# Patient Record
Sex: Female | Born: 1940
Health system: Southern US, Community
[De-identification: ages and names within clinical notes are randomized; demographics above are authoritative.]

## PROBLEM LIST (undated history)

## (undated) DIAGNOSIS — D649 Anemia, unspecified: Secondary | ICD-10-CM

## (undated) DIAGNOSIS — Z87442 Personal history of urinary calculi: Secondary | ICD-10-CM

## (undated) DIAGNOSIS — M1990 Unspecified osteoarthritis, unspecified site: Secondary | ICD-10-CM

## (undated) DIAGNOSIS — K620 Anal polyp: Secondary | ICD-10-CM

## (undated) DIAGNOSIS — K5792 Diverticulitis of intestine, part unspecified, without perforation or abscess without bleeding: Secondary | ICD-10-CM

## (undated) DIAGNOSIS — I10 Essential (primary) hypertension: Secondary | ICD-10-CM

## (undated) DIAGNOSIS — K6389 Other specified diseases of intestine: Secondary | ICD-10-CM

## (undated) DIAGNOSIS — N200 Calculus of kidney: Secondary | ICD-10-CM

## (undated) DIAGNOSIS — N189 Chronic kidney disease, unspecified: Secondary | ICD-10-CM

## (undated) DIAGNOSIS — K5010 Crohn's disease of large intestine without complications: Secondary | ICD-10-CM

## (undated) HISTORY — DX: Anal polyp: K62.0

## (undated) HISTORY — DX: Crohn's disease of large intestine without complications: K50.10

## (undated) HISTORY — DX: Essential (primary) hypertension: I10

## (undated) HISTORY — DX: Calculus of kidney: N20.0

## (undated) HISTORY — PX: KIDNEY STONE SURGERY: SHX686

---

## 1999-02-20 ENCOUNTER — Other Ambulatory Visit: Admission: RE | Admit: 1999-02-20 | Discharge: 1999-02-20 | Payer: Self-pay | Admitting: *Deleted

## 1999-08-13 ENCOUNTER — Other Ambulatory Visit: Admission: RE | Admit: 1999-08-13 | Discharge: 1999-08-13 | Payer: Self-pay | Admitting: *Deleted

## 1999-10-16 ENCOUNTER — Encounter (INDEPENDENT_AMBULATORY_CARE_PROVIDER_SITE_OTHER): Payer: Self-pay

## 1999-10-16 ENCOUNTER — Other Ambulatory Visit: Admission: RE | Admit: 1999-10-16 | Discharge: 1999-10-16 | Payer: Self-pay | Admitting: *Deleted

## 1999-10-30 ENCOUNTER — Encounter: Admission: RE | Admit: 1999-10-30 | Discharge: 1999-10-30 | Payer: Self-pay | Admitting: *Deleted

## 2000-02-03 ENCOUNTER — Other Ambulatory Visit: Admission: RE | Admit: 2000-02-03 | Discharge: 2000-02-03 | Payer: Self-pay | Admitting: *Deleted

## 2000-02-03 ENCOUNTER — Encounter (INDEPENDENT_AMBULATORY_CARE_PROVIDER_SITE_OTHER): Payer: Self-pay

## 2000-04-28 ENCOUNTER — Other Ambulatory Visit: Admission: RE | Admit: 2000-04-28 | Discharge: 2000-04-28 | Payer: Self-pay | Admitting: *Deleted

## 2000-09-08 ENCOUNTER — Other Ambulatory Visit: Admission: RE | Admit: 2000-09-08 | Discharge: 2000-09-08 | Payer: Self-pay | Admitting: *Deleted

## 2000-10-05 ENCOUNTER — Encounter: Admission: RE | Admit: 2000-10-05 | Discharge: 2000-10-05 | Payer: Self-pay | Admitting: Specialist

## 2000-10-05 ENCOUNTER — Encounter: Payer: Self-pay | Admitting: Specialist

## 2000-10-31 ENCOUNTER — Encounter: Admission: RE | Admit: 2000-10-31 | Discharge: 2000-10-31 | Payer: Self-pay | Admitting: *Deleted

## 2000-11-04 ENCOUNTER — Encounter: Admission: RE | Admit: 2000-11-04 | Discharge: 2000-11-04 | Payer: Self-pay | Admitting: *Deleted

## 2000-11-10 ENCOUNTER — Ambulatory Visit (HOSPITAL_COMMUNITY): Admission: RE | Admit: 2000-11-10 | Discharge: 2000-11-10 | Payer: Self-pay | Admitting: Specialist

## 2001-05-15 ENCOUNTER — Other Ambulatory Visit: Admission: RE | Admit: 2001-05-15 | Discharge: 2001-05-15 | Payer: Self-pay | Admitting: *Deleted

## 2001-10-03 ENCOUNTER — Other Ambulatory Visit: Admission: RE | Admit: 2001-10-03 | Discharge: 2001-10-03 | Payer: Self-pay | Admitting: *Deleted

## 2001-11-06 ENCOUNTER — Encounter: Admission: RE | Admit: 2001-11-06 | Discharge: 2001-11-06 | Payer: Self-pay | Admitting: *Deleted

## 2002-10-10 ENCOUNTER — Other Ambulatory Visit: Admission: RE | Admit: 2002-10-10 | Discharge: 2002-10-10 | Payer: Self-pay | Admitting: *Deleted

## 2003-04-08 ENCOUNTER — Ambulatory Visit (HOSPITAL_COMMUNITY): Admission: RE | Admit: 2003-04-08 | Discharge: 2003-04-08 | Payer: Self-pay | Admitting: Gastroenterology

## 2003-04-08 ENCOUNTER — Encounter (INDEPENDENT_AMBULATORY_CARE_PROVIDER_SITE_OTHER): Payer: Self-pay | Admitting: *Deleted

## 2003-04-08 HISTORY — PX: COLONOSCOPY: SHX174

## 2003-10-12 DIAGNOSIS — K620 Anal polyp: Secondary | ICD-10-CM

## 2003-10-12 HISTORY — DX: Anal polyp: K62.0

## 2003-10-31 ENCOUNTER — Other Ambulatory Visit: Admission: RE | Admit: 2003-10-31 | Discharge: 2003-10-31 | Payer: Self-pay | Admitting: Dermatology

## 2003-11-06 ENCOUNTER — Encounter (INDEPENDENT_AMBULATORY_CARE_PROVIDER_SITE_OTHER): Payer: Self-pay | Admitting: *Deleted

## 2003-11-06 ENCOUNTER — Ambulatory Visit (HOSPITAL_COMMUNITY): Admission: RE | Admit: 2003-11-06 | Discharge: 2003-11-06 | Payer: Self-pay | Admitting: Gastroenterology

## 2003-11-06 HISTORY — PX: FLEXIBLE SIGMOIDOSCOPY: SHX1649

## 2003-11-15 ENCOUNTER — Other Ambulatory Visit: Admission: RE | Admit: 2003-11-15 | Discharge: 2003-11-15 | Payer: Self-pay | Admitting: *Deleted

## 2003-12-31 ENCOUNTER — Encounter: Admission: RE | Admit: 2003-12-31 | Discharge: 2003-12-31 | Payer: Self-pay | Admitting: General Surgery

## 2004-01-02 ENCOUNTER — Ambulatory Visit (HOSPITAL_COMMUNITY): Admission: RE | Admit: 2004-01-02 | Discharge: 2004-01-02 | Payer: Self-pay | Admitting: General Surgery

## 2004-01-02 ENCOUNTER — Ambulatory Visit (HOSPITAL_BASED_OUTPATIENT_CLINIC_OR_DEPARTMENT_OTHER): Admission: RE | Admit: 2004-01-02 | Discharge: 2004-01-02 | Payer: Self-pay | Admitting: General Surgery

## 2004-01-02 ENCOUNTER — Encounter (INDEPENDENT_AMBULATORY_CARE_PROVIDER_SITE_OTHER): Payer: Self-pay | Admitting: Specialist

## 2004-01-02 HISTORY — PX: OTHER SURGICAL HISTORY: SHX169

## 2008-02-06 ENCOUNTER — Ambulatory Visit (HOSPITAL_COMMUNITY): Admission: RE | Admit: 2008-02-06 | Discharge: 2008-02-06 | Payer: Self-pay | Admitting: Urology

## 2010-06-09 ENCOUNTER — Encounter (HOSPITAL_COMMUNITY): Admission: RE | Admit: 2010-06-09 | Discharge: 2010-07-09 | Payer: Self-pay | Admitting: Orthopedic Surgery

## 2011-02-08 ENCOUNTER — Other Ambulatory Visit: Payer: Self-pay | Admitting: Obstetrics & Gynecology

## 2011-02-26 NOTE — Op Note (Signed)
Christine Newman, Christine Newman                      ACCOUNT NO.:  [id number]   MEDICAL RECORD NO.:  [id number]                   PATIENT TYPE:  AMB   LOCATION:  DSC                                  FACILITY:  [facility]   PHYSICIAN:  Odis Hollingshead, M.D.            DATE OF BIRTH:  April 21, 1941   DATE OF PROCEDURE:  01/02/2004  DATE OF DISCHARGE:                                 OPERATIVE REPORT   PREOPERATIVE DIAGNOSIS:  Anal polyp.   POSTOPERATIVE DIAGNOSIS:  Anal polyp.   PROCEDURES:  1. Anoscopy.  2. Anal polypectomy (right lateral position at 9 o'clock).   SURGEON:  Odis Hollingshead, M.D.   ANESTHESIA:  General.   INDICATIONS FOR PROCEDURE:  The patient is a 70 year old female who  underwent a screening colonoscopy.  There was a nodular mass noted at the  anal verge and this was biopsied and demonstrated some benign rectal mucosa.  There was, however, a polyp at the dentate line that could not be removed  and thus she presents for that.  The procedure and the risks were discussed  with her.   DESCRIPTION OF PROCEDURE:  She was seen in the holding area and then brought  to the operating room, placed supine on the operating table and general  anesthetic was administered.  She was then placed in the lithotomy position.  The perianal area was sterilely prepped and draped.  Digital rectal  examination was performed and a polypoid mass could be palpated in the right  lateral position at approximately 9 o'clock. Anoscopy was performed and no  other masses were noted.  The polypoid mass was grasped and using the  electrocautery it was excised and then sent to pathology.  Bleeding was  controlled with the cautery.  This was at the dentate line.  I then  performed a perianal block with a mixture of Marcaine and lidocaine.  I  reinspected the anal area and again saw no other lesions.  I then removed  the anoscope.  A dry dressing was applied.   She tolerated the procedure well without any  apparent complications and was  taken to the recovery room in satisfactory condition.  She was given  postoperative instructions and will come back and see me in a couple of  weeks for follow-up.  I told her we would contact me with her pathology  report.                                               Odis Hollingshead, M.D.    Katina Degree  D:  01/02/2004  T:  01/02/2004  Job:  [id number]   cc:   Nelwyn Salisbury, M.D.  [place], [address]  [place] [address]  Fax: [phone]

## 2011-02-26 NOTE — Op Note (Signed)
Christine Newman, BOTTENFIELD                      ACCOUNT NO.:  [id number]   MEDICAL RECORD NO.:  [id number]                   PATIENT TYPE:  AMB   LOCATION:  ENDO                                 FACILITY:  [facility]   PHYSICIAN:  Nelwyn Salisbury, M.D.               DATE OF BIRTH:  1941-04-27   DATE OF PROCEDURE:  04/08/2003  DATE OF DISCHARGE:                                 OPERATIVE REPORT   PROCEDURE PERFORMED:  Colonoscopy with biopsies.   ENDOSCOPIST:  Juanita Craver, M.D.   INSTRUMENT USED:  Olympus video colonoscope.   INDICATIONS FOR PROCEDURE:  The patient is a 70 year old white female  undergoing screening colonoscopy.  Rectal examination with some nodularity  at the anal verge.   PREPROCEDURE PREPARATION:  Informed consent was procured from the patient.  The patient was fasted for eight hours prior to the procedure and prepped  with a bottle of magnesium citrate and a gallon of GoLYTELY the night prior  to the procedure.   PREPROCEDURE PHYSICAL:  The patient had stable vital signs.  Neck supple.  Chest clear to auscultation.  S1 and S2 regular.  Abdomen soft with normal  bowel sounds.   DESCRIPTION OF PROCEDURE:  The patient was placed in left lateral decubitus  position and sedated with 75 mg of Demerol and 7.5 mg of Versed  intravenously.  Once the patient was adequately sedated and maintained on  low flow oxygen and continuous cardiac monitoring, the Olympus video  colonoscope was advanced from the rectum to the cecum.  There was some  residual stool in the colon and multiple washes were done.  Mild  inflammatory changes were noted at the lip of the ileocecal valve but the  terminal ileum was not visualized.  The appendicular orifice and ileocecal  valve were clearly visualized and photographed.  The rest of the colonic  mucosa appeared healthy including the right colon, transverse colon, left  colon.  A nodular polypoid lesion was seen at the anal verge.  This was  biopsied for pathology.  Small internal and external hemorrhoids were seen  on retroflexion and anal inspection respectively.  The patient tolerated the  procedure well without complications.   IMPRESSION:  1. Small nonbleeding internal and external hemorrhoids.  2. Small polypoid mass at anal verge, biopsy done for pathology, results     pending.  3. Normal-appearing left colon, transverse colon, right colon, including     cecum.  4. Stenotic ileocecal valve.  Terminal ileum not visualized.   RECOMMENDATIONS:  1. Small bowel follow-through will be done and further recommendations will     be made as needed.  2. Await pathology results.  3. Outpatient follow-up in the next two weeks or earlier if need be.  Nelwyn Salisbury, M.D.    JNM/MEDQ  D:  04/08/2003  T:  04/09/2003  Job:  [id number]   cc:   Freddie Apley, M.D.  [address]  [place] [address]  Fax: [phone]

## 2011-02-26 NOTE — Op Note (Signed)
NAME:  MARTIA, Christine Newman                      ACCOUNT NO.:  [id number]   MEDICAL RECORD NO.:  [id number]                   PATIENT TYPE:  AMB   LOCATION:  ENDO                                 FACILITY:  [facility]   PHYSICIAN:  Nelwyn Salisbury, M.D.               DATE OF BIRTH:  1940-10-28   DATE OF PROCEDURE:  11/06/2003  DATE OF DISCHARGE:                                 OPERATIVE REPORT   PROCEDURE PERFORMED:  Flexible sigmoidoscopy with biopsies at the anal  verge.   ENDOSCOPIST:  Juanita Craver, M.D.   INSTRUMENT USED:  Olympus video colonoscope.   INDICATIONS FOR PROCEDURE:  The patient is a 70 year old white female with a  history of mass at the anal verge biopsied six month ago, revealed to be a  benign lesion consistent with mucosal prolapse syndrome.  Repeat flexible  sigmoidoscopy is being done to re-look at this area and re-biopsy any  recurrent lesion.   PREPROCEDURE PREPARATION:  Informed consent was procured from the patient.  The patient was fasted for eight hours prior to the procedure and prepped  with a bottle or MiraLax and two Fleet enemas the night prior to the  procedure.   PREPROCEDURE PHYSICAL:  The patient had stable vital signs.  Neck supple.  Chest clear to auscultation.  S1 and S2 regular.  Abdomen soft with normal  bowel sounds.   DESCRIPTION OF PROCEDURE:  The patient was placed in left lateral decubitus  position.  The patient chose not to have any sedation.  Once the patient was  adequately positioned, the Olympus video colonoscope was advanced from the  rectum to 30 cm.  A few early scattered diverticula were seen  in the left  colon.  A small nodular mass was biopsied at the anal verge.  This was  irregular in nature, was more easily visualized on retroflexion.  Small  internal hemorrhoids were seen as well.  The patient tolerated the procedure  well without complications.   IMPRESSION:  1. Small nodular mass at anal verge, biopsied for  pathology.  2. Early left-sided diverticula.   RECOMMENDATIONS:  1. Await pathology results.  2. Surgical evaluation by Odis Hollingshead, M.D. for transanal excision of     this lesion.  3. Outpatient followup thereafter.                                               Nelwyn Salisbury, M.D.    JNM/MEDQ  D:  11/06/2003  T:  11/06/2003  Job:  [id number]   cc:   Freddie Apley, M.D.  [place]  [address] [place] [address]  Fax: [phone]   Odis Hollingshead, M.D.  [address], [address] [place]  [place] [address]  Fax: [phone]

## 2013-03-08 ENCOUNTER — Ambulatory Visit (INDEPENDENT_AMBULATORY_CARE_PROVIDER_SITE_OTHER): Payer: 59 | Admitting: Family Medicine

## 2013-03-08 ENCOUNTER — Encounter: Payer: Self-pay | Admitting: Family Medicine

## 2013-03-08 VITALS — BP 122/78 | HR 80 | Wt 169.6 lb

## 2013-03-08 DIAGNOSIS — N289 Disorder of kidney and ureter, unspecified: Secondary | ICD-10-CM

## 2013-03-08 DIAGNOSIS — I10 Essential (primary) hypertension: Secondary | ICD-10-CM

## 2013-03-08 NOTE — Progress Notes (Signed)
  Subjective:    Patient ID: Karlton Lemon, female    DOB: 23-Apr-1941, 72 y.o.   MRN: [id number]  Hypertension This is a chronic problem. The current episode started more than 1 year ago. The problem is unchanged. The problem is controlled. Pertinent negatives include no anxiety. There are no associated agents to hypertension. Past treatments include ACE inhibitors and diuretics. The current treatment provides significant improvement. There are no compliance problems.    Followed by specialist for her chronic renal insufficiency Checks bp on occasion.  Review of Systems No chest pain no headache no abdominal pain review systems otherwise negative.    Objective:   Physical Exam  Alert no acute distress. Vitals stable. Lungs clear. Heart regular rate and rhythm. HEENT normal.  Impression blood work reviewed    Assessment & Plan:  Impression #1 hypertension good control. #2 renal insufficiency clinically stable. Plan diet exercise discussed. Maintain same medications. Recheck in 6 months. WSL

## 2013-03-11 DIAGNOSIS — N289 Disorder of kidney and ureter, unspecified: Secondary | ICD-10-CM | POA: Insufficient documentation

## 2013-03-11 DIAGNOSIS — I10 Essential (primary) hypertension: Secondary | ICD-10-CM | POA: Insufficient documentation

## 2013-05-07 ENCOUNTER — Other Ambulatory Visit: Payer: Self-pay | Admitting: Family Medicine

## 2013-06-06 ENCOUNTER — Telehealth: Payer: Self-pay | Admitting: Family Medicine

## 2013-06-06 MED ORDER — ENALAPRIL MALEATE 20 MG PO TABS: ORAL_TABLET | ORAL | Status: DC

## 2013-06-06 NOTE — Telephone Encounter (Signed)
Patient states she needs a prescription for enalapril (VASOTEC) 20 MG tablet  [facility].  Only has one pill left to take.

## 2013-06-06 NOTE — Telephone Encounter (Signed)
RX refill for enalapril 20 mg sent to pharmacy. Patient was notified.

## 2013-08-27 ENCOUNTER — Encounter: Payer: Self-pay | Admitting: Family Medicine

## 2013-08-27 ENCOUNTER — Ambulatory Visit (INDEPENDENT_AMBULATORY_CARE_PROVIDER_SITE_OTHER): Payer: 59 | Admitting: Family Medicine

## 2013-08-27 VITALS — BP 132/82 | Ht 66.0 in | Wt 175.5 lb

## 2013-08-27 DIAGNOSIS — H8110 Benign paroxysmal vertigo, unspecified ear: Secondary | ICD-10-CM | POA: Insufficient documentation

## 2013-08-27 MED ORDER — ONDANSETRON 4 MG PO TBDP: 4.0000 mg | ORAL_TABLET | Freq: Three times a day (TID) | ORAL | Status: DC | PRN

## 2013-08-27 MED ORDER — MECLIZINE HCL 25 MG PO TABS: 25.0000 mg | ORAL_TABLET | Freq: Three times a day (TID) | ORAL | Status: AC | PRN

## 2013-08-27 MED ORDER — AMOXICILLIN 500 MG PO TABS: ORAL_TABLET | ORAL | Status: DC

## 2013-08-27 NOTE — Progress Notes (Signed)
  Subjective:    Patient ID: Christine Newman, female    DOB: [birth year], 72 y.o.   MRN: [id number]  HPI Patient arrives with complaint of dizzy spells-room spins off and on for a week.for over a week has had symptoms  Room circling around and around. Woke up with it.  Felt dizzy when walking. Nauseated off and on. No vom  Dizzy with change of position.  Not true headache.  Feels funny, at times. Patient also concerned because family history of brain cancer  No sig drainage or congestion, but some stopped upness in nose Put up with it for every dday until now No otc meds Review of Systems  soe nausea at times no vomiting no chest pain no abdominal pain no change in bowel habits ROS otherwise negative    Objective:   Physical Exam Alert anxious appearing hydration good. HEENT completely normal. Neuro exam intact. Cerebellar function. Normal. Lungs clear. Heart regular in rhythm.       Assessment & Plan:  Impression vertigo discussed at length. #2 possible sinusitis discussed plan Antivert 25 every 6 when necessary for dizziness. A mocks 500 3 times a day 10 days. Zofran when necessary for nausea. Exercise encourage. Check in 2 weeks. Easily 25 minutes spent most in discussion. WSL

## 2013-09-08 ENCOUNTER — Encounter: Payer: Self-pay | Admitting: *Deleted

## 2013-09-10 ENCOUNTER — Ambulatory Visit (INDEPENDENT_AMBULATORY_CARE_PROVIDER_SITE_OTHER): Payer: 59 | Admitting: Family Medicine

## 2013-09-10 ENCOUNTER — Encounter: Payer: Self-pay | Admitting: Family Medicine

## 2013-09-10 VITALS — BP 150/90 | Ht 66.0 in | Wt 174.6 lb

## 2013-09-10 DIAGNOSIS — H8110 Benign paroxysmal vertigo, unspecified ear: Secondary | ICD-10-CM

## 2013-09-10 DIAGNOSIS — I10 Essential (primary) hypertension: Secondary | ICD-10-CM

## 2013-09-10 NOTE — Progress Notes (Signed)
   Subjective:    Patient ID: Christine Newman, female    DOB: October 29, 1940, 72 y.o.   MRN: [id number]  HPI Patient is here today for a 2 week f/u on her vertigo.  She states she feels a lot better. She does experience some dizziness in the morning, but it was nothing compared to what it used to be.  Still having spells but much improved  No vertigo symp now,  Nausea is doing better, mo had some diziness  No other concerns. Pt gets seasick but not carsick. Compliant with blood pressure medicine and all numbers in good control Review of Systems No headache no chest pain no abdominal pain no change in bowel habits no nausea no diaphoresis ROS otherwise negative.    Objective:   Physical Exam Alert HEENT normal. Lungs clear. Heart regular rate and rhythm. Blood pressure 134/82 on repeat.       Assessment & Plan:  Impression 1 hypertension good control. #2 vertigo clinically much improved. Plan diet exercise discussed. Maintain same medications. Send copy of blood work are away from specialist. Check every 6 months. WSL

## 2013-10-15 ENCOUNTER — Other Ambulatory Visit: Payer: Self-pay | Admitting: Family Medicine

## 2013-12-03 ENCOUNTER — Other Ambulatory Visit: Payer: Self-pay | Admitting: Family Medicine

## 2014-04-04 ENCOUNTER — Other Ambulatory Visit: Payer: Self-pay | Admitting: Family Medicine

## 2014-05-31 ENCOUNTER — Other Ambulatory Visit: Payer: Self-pay | Admitting: Family Medicine

## 2014-06-06 ENCOUNTER — Ambulatory Visit (INDEPENDENT_AMBULATORY_CARE_PROVIDER_SITE_OTHER): Payer: 59 | Admitting: Otolaryngology

## 2014-06-06 DIAGNOSIS — H6120 Impacted cerumen, unspecified ear: Secondary | ICD-10-CM

## 2014-06-06 DIAGNOSIS — H903 Sensorineural hearing loss, bilateral: Secondary | ICD-10-CM

## 2014-07-31 ENCOUNTER — Other Ambulatory Visit: Payer: Self-pay | Admitting: Family Medicine

## 2014-09-02 ENCOUNTER — Other Ambulatory Visit: Payer: Self-pay | Admitting: Family Medicine

## 2014-10-09 ENCOUNTER — Encounter: Payer: Self-pay | Admitting: Family Medicine

## 2014-10-09 ENCOUNTER — Ambulatory Visit (INDEPENDENT_AMBULATORY_CARE_PROVIDER_SITE_OTHER): Payer: 59 | Admitting: Family Medicine

## 2014-10-09 VITALS — Ht 66.0 in

## 2014-10-09 DIAGNOSIS — I10 Essential (primary) hypertension: Secondary | ICD-10-CM

## 2014-10-09 MED ORDER — VERAPAMIL HCL ER 180 MG PO TBCR: 180.0000 mg | EXTENDED_RELEASE_TABLET | Freq: Every day | ORAL | Status: DC

## 2014-10-09 MED ORDER — HYDROCHLOROTHIAZIDE 25 MG PO TABS: ORAL_TABLET | ORAL | Status: DC

## 2014-10-09 NOTE — Progress Notes (Signed)
   Subjective:    Patient ID: Christine Newman, female    DOB: 05-03-1941, 73 y.o.   MRN: [id number]  Hypertension This is a chronic problem. The current episode started more than 1 year ago. Risk factors for coronary artery disease include post-menopausal state. Treatments tried:  HCTZ.   Pt has felt fine, Saw the kidney doc the 7th of sept Kidney doctor stopped her Vasotec on Dec 7th and did not replace it.  bp has been up lately, pt's cuff is on the small side, called the nephrologist who rec staying with the same med.     Review of Systems Mild to diffuse headache. No chest pain no fever no cough    Objective:   Physical Exam  Alert anxious. 142/92 on repeat HEENT otherwise normal. Lungs clear. Heart regular in rhythm.      Assessment & Plan:  34 impression hypertension with renal insufficiency. Enalapril was stopped hydrochlorothiazide was increased to one full tablet. Continues to note elevated blood pressures been had verapamil 180 SR daily. Follow-up in one month. WSL

## 2014-11-06 ENCOUNTER — Ambulatory Visit (INDEPENDENT_AMBULATORY_CARE_PROVIDER_SITE_OTHER): Payer: Medicare Other | Admitting: Family Medicine

## 2014-11-06 ENCOUNTER — Encounter: Payer: Self-pay | Admitting: Family Medicine

## 2014-11-06 VITALS — BP 128/78 | Ht 66.0 in | Wt 165.0 lb

## 2014-11-06 DIAGNOSIS — I10 Essential (primary) hypertension: Secondary | ICD-10-CM

## 2014-11-06 NOTE — Progress Notes (Signed)
   Subjective:    Patient ID: Christine Newman, female    DOB: 1941/09/24, 74 y.o.   MRN: [id number]  HPI Patient is here today for a recheck on her BP.  She was started back on HCTZ and started verapamil.  BP is much better today. There is also a 10 lb difference.   She has noticed that she has been voiding a lot more and more often d/t the fluid pill.   Pt feels better, numbers running farily good, exercising regularly  Sticking with meds  verap hs and fluid pill in the morn,   Review of Systems No headache no chest pain no shortness of breath no abdominal pain no change in bowel habits alert anxious appearing. Vitals stable. Blood pressure excellent on repeat 134/82. Lungs clear. Heart regular in rhythm. Ankles without edema    Objective:   Physical Exam See above       Assessment & Plan:  Impression hypertension control improved plan maintain same medication. Diet discussed exercise discussed. Recheck in 6 months. WSL

## 2015-01-13 ENCOUNTER — Inpatient Hospital Stay (HOSPITAL_COMMUNITY)
Admission: EM | Admit: 2015-01-13 | Discharge: 2015-01-17 | DRG: 392 | Disposition: A | Discharge status: Home / Self Care | Payer: Medicare Other | Attending: Internal Medicine | Admitting: Internal Medicine

## 2015-01-13 ENCOUNTER — Ambulatory Visit (INDEPENDENT_AMBULATORY_CARE_PROVIDER_SITE_OTHER): Payer: Medicare Other | Admitting: Family Medicine

## 2015-01-13 ENCOUNTER — Emergency Department (HOSPITAL_COMMUNITY): Payer: Medicare Other

## 2015-01-13 ENCOUNTER — Encounter (HOSPITAL_COMMUNITY): Payer: Self-pay | Admitting: Emergency Medicine

## 2015-01-13 ENCOUNTER — Encounter: Payer: Self-pay | Admitting: Family Medicine

## 2015-01-13 VITALS — Temp 101.8°F | Ht 66.0 in | Wt 159.2 lb

## 2015-01-13 DIAGNOSIS — R509 Fever, unspecified: Secondary | ICD-10-CM

## 2015-01-13 DIAGNOSIS — D649 Anemia, unspecified: Secondary | ICD-10-CM | POA: Diagnosis present

## 2015-01-13 DIAGNOSIS — K5720 Diverticulitis of large intestine with perforation and abscess without bleeding: Secondary | ICD-10-CM | POA: Diagnosis not present

## 2015-01-13 DIAGNOSIS — E860 Dehydration: Secondary | ICD-10-CM | POA: Diagnosis present

## 2015-01-13 DIAGNOSIS — N390 Urinary tract infection, site not specified: Secondary | ICD-10-CM | POA: Diagnosis present

## 2015-01-13 DIAGNOSIS — IMO0002 Reserved for concepts with insufficient information to code with codable children: Secondary | ICD-10-CM

## 2015-01-13 DIAGNOSIS — Z8249 Family history of ischemic heart disease and other diseases of the circulatory system: Secondary | ICD-10-CM | POA: Diagnosis not present

## 2015-01-13 DIAGNOSIS — I10 Essential (primary) hypertension: Secondary | ICD-10-CM | POA: Diagnosis present

## 2015-01-13 DIAGNOSIS — N183 Chronic kidney disease, stage 3 unspecified: Secondary | ICD-10-CM | POA: Diagnosis present

## 2015-01-13 DIAGNOSIS — E876 Hypokalemia: Secondary | ICD-10-CM | POA: Diagnosis present

## 2015-01-13 DIAGNOSIS — K6389 Other specified diseases of intestine: Secondary | ICD-10-CM | POA: Diagnosis not present

## 2015-01-13 DIAGNOSIS — K5780 Diverticulitis of intestine, part unspecified, with perforation and abscess without bleeding: Secondary | ICD-10-CM | POA: Diagnosis present

## 2015-01-13 DIAGNOSIS — I129 Hypertensive chronic kidney disease with stage 1 through stage 4 chronic kidney disease, or unspecified chronic kidney disease: Secondary | ICD-10-CM | POA: Diagnosis present

## 2015-01-13 DIAGNOSIS — R300 Dysuria: Secondary | ICD-10-CM

## 2015-01-13 DIAGNOSIS — K5700 Diverticulitis of small intestine with perforation and abscess without bleeding: Secondary | ICD-10-CM | POA: Diagnosis not present

## 2015-01-13 DIAGNOSIS — Z803 Family history of malignant neoplasm of breast: Secondary | ICD-10-CM

## 2015-01-13 DIAGNOSIS — N179 Acute kidney failure, unspecified: Secondary | ICD-10-CM | POA: Diagnosis present

## 2015-01-13 DIAGNOSIS — R1030 Lower abdominal pain, unspecified: Secondary | ICD-10-CM | POA: Diagnosis present

## 2015-01-13 DIAGNOSIS — K5740 Diverticulitis of both small and large intestine with perforation and abscess without bleeding: Secondary | ICD-10-CM | POA: Diagnosis present

## 2015-01-13 DIAGNOSIS — L0291 Cutaneous abscess, unspecified: Secondary | ICD-10-CM

## 2015-01-13 HISTORY — DX: Chronic kidney disease, unspecified: N18.9

## 2015-01-13 HISTORY — DX: Diverticulitis of intestine, part unspecified, without perforation or abscess without bleeding: K57.92

## 2015-01-13 HISTORY — DX: Other specified diseases of intestine: K63.89

## 2015-01-13 LAB — CBC WITH DIFFERENTIAL/PLATELET
Basophils Absolute: 0 10*3/uL (ref 0.0–0.1)
Basophils Relative: 0 % (ref 0–1)
EOS PCT: 0 % (ref 0–5)
Eosinophils Absolute: 0.1 10*3/uL (ref 0.0–0.7)
HCT: 37 % (ref 36.0–46.0)
Hemoglobin: 11.9 g/dL — ABNORMAL LOW (ref 12.0–15.0)
LYMPHS ABS: 1.2 10*3/uL (ref 0.7–4.0)
Lymphocytes Relative: 8 % — ABNORMAL LOW (ref 12–46)
MCH: 29.1 pg (ref 26.0–34.0)
MCHC: 32.2 g/dL (ref 30.0–36.0)
MCV: 90.5 fL (ref 78.0–100.0)
Monocytes Absolute: 1.1 10*3/uL — ABNORMAL HIGH (ref 0.1–1.0)
Monocytes Relative: 7 % (ref 3–12)
Neutro Abs: 12.5 10*3/uL — ABNORMAL HIGH (ref 1.7–7.7)
Neutrophils Relative %: 85 % — ABNORMAL HIGH (ref 43–77)
Platelets: 338 10*3/uL (ref 150–400)
RBC: 4.09 MIL/uL (ref 3.87–5.11)
RDW: 12.6 % (ref 11.5–15.5)
WBC: 14.9 10*3/uL — ABNORMAL HIGH (ref 4.0–10.5)

## 2015-01-13 LAB — URINALYSIS, ROUTINE W REFLEX MICROSCOPIC
BILIRUBIN URINE: NEGATIVE
Glucose, UA: NEGATIVE mg/dL
Nitrite: POSITIVE — AB
PH: 5.5 (ref 5.0–8.0)
Urobilinogen, UA: 0.2 mg/dL (ref 0.0–1.0)

## 2015-01-13 LAB — COMPREHENSIVE METABOLIC PANEL
ALK PHOS: 70 U/L (ref 39–117)
ALT: 10 U/L (ref 0–35)
ANION GAP: 11 (ref 5–15)
AST: 16 U/L (ref 0–37)
Albumin: 3 g/dL — ABNORMAL LOW (ref 3.5–5.2)
BUN: 20 mg/dL (ref 6–23)
CALCIUM: 8.9 mg/dL (ref 8.4–10.5)
CHLORIDE: 98 mmol/L (ref 96–112)
CO2: 27 mmol/L (ref 19–32)
Creatinine, Ser: 1.91 mg/dL — ABNORMAL HIGH (ref 0.50–1.10)
GFR calc Af Amer: 29 mL/min — ABNORMAL LOW (ref >90)
GFR calc non Af Amer: 25 mL/min — ABNORMAL LOW (ref >90)
Glucose, Bld: 113 mg/dL — ABNORMAL HIGH (ref 70–99)
Potassium: 3.1 mmol/L — ABNORMAL LOW (ref 3.5–5.1)
Sodium: 136 mmol/L (ref 135–145)
TOTAL PROTEIN: 6.7 g/dL (ref 6.0–8.3)
Total Bilirubin: 0.6 mg/dL (ref 0.3–1.2)

## 2015-01-13 LAB — POCT URINALYSIS DIPSTICK
PH UA: 6
SPEC GRAV UA: 1.015

## 2015-01-13 LAB — URINE MICROSCOPIC-ADD ON

## 2015-01-13 LAB — PROTIME-INR
INR: 1.14 (ref 0.00–1.49)
Prothrombin Time: 14.8 seconds (ref 11.6–15.2)

## 2015-01-13 LAB — I-STAT CG4 LACTIC ACID, ED: LACTIC ACID, VENOUS: 1.03 mmol/L (ref 0.5–2.0)

## 2015-01-13 LAB — LIPASE, BLOOD: LIPASE: 27 U/L (ref 11–59)

## 2015-01-13 MED ORDER — DEXTROSE 5 % IV SOLN
1.0000 g | Freq: Once | INTRAVENOUS | Status: AC
  Administered 2015-01-13: 1 g via INTRAVENOUS
  Filled 2015-01-13: qty 10

## 2015-01-13 MED ORDER — METRONIDAZOLE IN NACL 5-0.79 MG/ML-% IV SOLN
500.0000 mg | Freq: Once | INTRAVENOUS | Status: AC
  Administered 2015-01-13: 500 mg via INTRAVENOUS
  Filled 2015-01-13: qty 100

## 2015-01-13 MED ORDER — CIPROFLOXACIN IN D5W 400 MG/200ML IV SOLN
400.0000 mg | Freq: Two times a day (BID) | INTRAVENOUS | Status: DC
  Administered 2015-01-13 – 2015-01-14 (×2): 400 mg via INTRAVENOUS
  Filled 2015-01-13 (×2): qty 200

## 2015-01-13 MED ORDER — SODIUM CHLORIDE 0.9 % IV SOLN: INTRAVENOUS | Status: DC

## 2015-01-13 MED ORDER — ACETAMINOPHEN 325 MG PO TABS
650.0000 mg | ORAL_TABLET | Freq: Four times a day (QID) | ORAL | Status: DC | PRN
  Administered 2015-01-15 – 2015-01-16 (×2): 650 mg via ORAL
  Filled 2015-01-13 (×2): qty 2

## 2015-01-13 MED ORDER — POTASSIUM CHLORIDE 10 MEQ/100ML IV SOLN
10.0000 meq | INTRAVENOUS | Status: AC
  Administered 2015-01-13 – 2015-01-14 (×5): 10 meq via INTRAVENOUS
  Filled 2015-01-13 (×2): qty 100

## 2015-01-13 MED ORDER — VERAPAMIL HCL ER 180 MG PO TBCR
180.0000 mg | EXTENDED_RELEASE_TABLET | Freq: Every day | ORAL | Status: DC
  Administered 2015-01-13 – 2015-01-16 (×4): 180 mg via ORAL
  Filled 2015-01-13 (×4): qty 1

## 2015-01-13 MED ORDER — ONDANSETRON HCL 4 MG/2ML IJ SOLN
4.0000 mg | Freq: Once | INTRAMUSCULAR | Status: DC
  Filled 2015-01-13: qty 2

## 2015-01-13 MED ORDER — SODIUM CHLORIDE 0.9 % IV SOLN
INTRAVENOUS | Status: DC
  Administered 2015-01-13: 23:00:00 via INTRAVENOUS

## 2015-01-13 MED ORDER — HYDROCHLOROTHIAZIDE 25 MG PO TABS
25.0000 mg | ORAL_TABLET | Freq: Every day | ORAL | Status: DC
  Administered 2015-01-14 – 2015-01-17 (×4): 25 mg via ORAL
  Filled 2015-01-13 (×4): qty 1

## 2015-01-13 MED ORDER — HEPARIN SODIUM (PORCINE) 5000 UNIT/ML IJ SOLN
5000.0000 [IU] | Freq: Three times a day (TID) | INTRAMUSCULAR | Status: DC
  Administered 2015-01-13 – 2015-01-14 (×2): 5000 [IU] via SUBCUTANEOUS
  Filled 2015-01-13 (×2): qty 1

## 2015-01-13 MED ORDER — POLYETHYLENE GLYCOL 3350 17 G PO PACK: 17.0000 g | PACK | Freq: Every day | ORAL | Status: DC | PRN

## 2015-01-13 MED ORDER — PHENAZOPYRIDINE HCL 100 MG PO TABS
200.0000 mg | ORAL_TABLET | Freq: Three times a day (TID) | ORAL | Status: DC
  Administered 2015-01-13 – 2015-01-15 (×5): 200 mg via ORAL
  Filled 2015-01-13 (×5): qty 2

## 2015-01-13 MED ORDER — ONDANSETRON HCL 4 MG/2ML IJ SOLN
4.0000 mg | Freq: Four times a day (QID) | INTRAMUSCULAR | Status: DC | PRN
  Administered 2015-01-15: 4 mg via INTRAVENOUS
  Filled 2015-01-13: qty 2

## 2015-01-13 MED ORDER — ONDANSETRON HCL 4 MG/2ML IJ SOLN: 4.0000 mg | Freq: Three times a day (TID) | INTRAMUSCULAR | Status: DC | PRN

## 2015-01-13 MED ORDER — ONDANSETRON HCL 4 MG PO TABS: 4.0000 mg | ORAL_TABLET | Freq: Four times a day (QID) | ORAL | Status: DC | PRN

## 2015-01-13 MED ORDER — METRONIDAZOLE IN NACL 5-0.79 MG/ML-% IV SOLN
500.0000 mg | Freq: Three times a day (TID) | INTRAVENOUS | Status: DC
  Administered 2015-01-14 – 2015-01-17 (×10): 500 mg via INTRAVENOUS
  Filled 2015-01-13 (×10): qty 100

## 2015-01-13 MED ORDER — MORPHINE SULFATE 4 MG/ML IJ SOLN: 4.0000 mg | INTRAMUSCULAR | Status: DC | PRN

## 2015-01-13 MED ORDER — HYDROMORPHONE HCL 1 MG/ML IJ SOLN: 0.5000 mg | INTRAMUSCULAR | Status: AC | PRN

## 2015-01-13 MED ORDER — ACETAMINOPHEN 650 MG RE SUPP
650.0000 mg | Freq: Four times a day (QID) | RECTAL | Status: DC | PRN
  Administered 2015-01-14: 650 mg via RECTAL
  Filled 2015-01-13 (×2): qty 1

## 2015-01-13 MED ORDER — SODIUM CHLORIDE 0.9 % IV SOLN
1000.0000 mL | INTRAVENOUS | Status: DC
  Administered 2015-01-13: 1000 mL via INTRAVENOUS

## 2015-01-13 NOTE — ED Notes (Signed)
Patient complaining of abdominal pain and fever x 1 1/2 weeks. Patient sent from Dr Malachy Moan office for evaluation. States temperature at Dr Wolfgang Phoenix was 101.8.

## 2015-01-13 NOTE — ED Provider Notes (Signed)
CSN: [id number]     Arrival date & time 01/13/15  1810 History   First MD Initiated Contact with Patient 01/13/15 1813     Chief Complaint  Patient presents with  . Abdominal Pain  . Fever     (Consider location/radiation/quality/duration/timing/severity/associated sxs/prior Treatment) HPI Patient presents with ongoing abdominal pain, fever, chills, nausea, anorexia. Illness began about 10 days ago, has progressed since onset, with no clear relieving or exacerbating factors. Pain is waxing/waning, episodic. Pain is lower abdominal, severe, without radiation. There is pressure associated with urinating, no defecation changes. There is increasing nausea, anorexia, but no vomiting. No chest pain, dyspnea. Patient was well prior to the onset of symptoms. Patient was sent here by her primary care physician. I discussed patient's case with the primary care physician prior to the patient's arrival.  Past Medical History  Diagnosis Date  . Hypertension   . Creatinine elevation    Past Surgical History  Procedure Laterality Date  . Kidney stone surgery    . Colonoscopy     Family History  Problem Relation Age of Onset  . Hypertension Mother   . Heart disease Father   . Cancer Sister     breast   History  Substance Use Topics  . Smoking status: Never Smoker   . Smokeless tobacco: Not on file  . Alcohol Use: No   OB History    No data available     Review of Systems  Constitutional:       Per HPI, otherwise negative  HENT:       Per HPI, otherwise negative  Respiratory:       Per HPI, otherwise negative  Cardiovascular:       Per HPI, otherwise negative  Gastrointestinal: Negative for vomiting.  Endocrine:       Negative aside from HPI  Genitourinary:       Neg aside from HPI   Musculoskeletal:       Per HPI, otherwise negative  Skin: Negative.   Neurological: Negative for syncope.      Allergies  Review of patient's allergies indicates no known  allergies.  Home Medications   Prior to Admission medications   Medication Sig Start Date End Date Taking? Authorizing Provider  Calcium Carbonate-Vitamin D (CALTRATE 600+D) 600-400 MG-UNIT per tablet Take 1 tablet by mouth 2 (two) times daily.   Yes Historical Provider, MD  Cholecalciferol (VITAMIN D-3 PO) Take by mouth. One daily   Yes Historical Provider, MD  folic acid (FOLVITE) 342 MCG tablet Take by mouth daily.   Yes Historical Provider, MD  hydrochlorothiazide (HYDRODIURIL) 25 MG tablet one tab qam 10/09/14  Yes Mikey Kirschner, MD  Multiple Vitamins-Minerals (MULTIVITAMIN WITH MINERALS) tablet Take 1 tablet by mouth daily.   Yes Historical Provider, MD  verapamil (CALAN-SR) 180 MG CR tablet Take 1 tablet (180 mg total) by mouth at bedtime. 10/09/14  Yes Mikey Kirschner, MD  ondansetron (ZOFRAN ODT) 4 MG disintegrating tablet Take 1 tablet (4 mg total) by mouth every 8 (eight) hours as needed for nausea or vomiting. Patient not taking: Reported on 01/13/2015 08/27/13   Mikey Kirschner, MD   BP 111/63 mmHg  Pulse 84  Temp(Src) 99.6 F (37.6 C) (Oral)  Resp 18  Ht 5' 6"  (1.676 m)  Wt 158 lb (71.668 kg)  BMI 25.51 kg/m2  SpO2 93% Physical Exam  Constitutional: She is oriented to person, place, and time. She appears well-developed and well-nourished. No distress.  HENT:  Head: Normocephalic and atraumatic.  Eyes: Conjunctivae and EOM are normal.  Cardiovascular: Normal rate and regular rhythm.   Pulmonary/Chest: Effort normal and breath sounds normal. No stridor. No respiratory distress.  Abdominal: She exhibits no distension. There is no hepatosplenomegaly. There is tenderness in the right lower quadrant, suprapubic area and left lower quadrant. There is guarding. There is no rigidity and no CVA tenderness.  Musculoskeletal: She exhibits no edema.  Neurological: She is alert and oriented to person, place, and time. No cranial nerve deficit.  Skin: Skin is warm and dry.   Psychiatric: She has a normal mood and affect.  Nursing note and vitals reviewed.   ED Course  Procedures (including critical care time) Labs Review Labs Reviewed  CBC WITH DIFFERENTIAL/PLATELET - Abnormal; Notable for the following:    WBC 14.9 (*)    Hemoglobin 11.9 (*)    Neutrophils Relative % 85 (*)    Neutro Abs 12.5 (*)    Lymphocytes Relative 8 (*)    Monocytes Absolute 1.1 (*)    All other components within normal limits  COMPREHENSIVE METABOLIC PANEL - Abnormal; Notable for the following:    Potassium 3.1 (*)    Glucose, Bld 113 (*)    Creatinine, Ser 1.91 (*)    Albumin 3.0 (*)    GFR calc non Af Amer 25 (*)    GFR calc Af Amer 29 (*)    All other components within normal limits  URINALYSIS, ROUTINE W REFLEX MICROSCOPIC - Abnormal; Notable for the following:    Specific Gravity, Urine >1.030 (*)    Hgb urine dipstick MODERATE (*)    Ketones, ur TRACE (*)    Protein, ur TRACE (*)    Nitrite POSITIVE (*)    Leukocytes, UA SMALL (*)    All other components within normal limits  URINE MICROSCOPIC-ADD ON - Abnormal; Notable for the following:    Squamous Epithelial / LPF MANY (*)    Bacteria, UA MANY (*)    All other components within normal limits  LIPASE, BLOOD  PROTIME-INR  I-STAT CG4 LACTIC ACID, ED    Imaging Review Ct Abdomen Pelvis Wo Contrast  01/13/2015   CLINICAL DATA:  Suprapubic abdominal pain and nausea for 10 days. Low-grade fever.  EXAM: CT ABDOMEN AND PELVIS WITHOUT CONTRAST  TECHNIQUE: Multidetector CT imaging of the abdomen and pelvis was performed following the standard protocol without IV contrast.  COMPARISON:  None.  FINDINGS: Lower chest: The lung bases are clear. No pleural effusion. The heart is normal in size. No pericardial effusion.  Hepatobiliary: No focal hepatic lesions or intrahepatic biliary dilatation. The gallbladder is grossly normal. No common bile duct dilatation.  Pancreas: No inflammatory changes, mass lesion or ductal  dilatation.  Spleen: Normal size.  No focal lesions.  Adrenals/Urinary Tract: The adrenal glands are normal. The left kidney is quite small and demonstrates marked renal cortical thinning. There are bilateral renal calculi with a large staghorn type calculus in the left renal pelvis measuring a maximum of 19 mm. No hydronephrosis or ureteral calculi. No bladder calculi.  Stomach/Bowel: The stomach, and duodenum are unremarkable. There is an inflammatory process in the mid central pelvis between the distal ileum in the sigmoid colon. Both of these areas are inflamed. This could be a diverticular abscess from the colon was involving the small bowel or possibly a small bowel perforation within abscess and between the two.  Vascular/Lymphatic: No mesenteric or retroperitoneal mass or adenopathy. Moderate aortic calcifications.  Other:  The bladder, uterus and ovaries are unremarkable. No free pelvic fluid. No inguinal mass or adenopathy.  Musculoskeletal: No significant bony findings. Moderate degenerative changes involving the spine.  IMPRESSION: Complex area of inflammation and probable developing abscess measuring 3.9 x 2.8 cm in the mid central pelvis between the sigmoid colon and the distal ileum.  Bilateral renal calculi with a large calculus in the left renal pelvis and severe chronic left renal atrophy.   Electronically Signed   By: Marijo Sanes M.D.   On: 01/13/2015 20:23    Pulse oximetry 100% room air normal  On repeat exam the patient is in no distress.  I discussed patient's case with our surgeon, who will assist with management tomorrow. Patient was admitted to the hospitalist team.  MDM   Final diagnoses:  Diverticulitis of large intestine with abscess without bleeding  UTI (lower urinary tract infection)   history presents with ongoing abdominal pain, fever, nausea, anorexia, weakness. Patient is found to have intra-abdominal abscess, likely from diverticular origin. No evidence for  peritonitis.  Patient received initial antibiotics, fluid resuscitation, was admitted for further evaluation and management.  Carmin Muskrat, MD 01/13/15 2211

## 2015-01-13 NOTE — ED Notes (Signed)
MD at bedside. 

## 2015-01-13 NOTE — Progress Notes (Signed)
   Subjective:    Patient ID: Christine Newman, female    DOB: 08/09/1941, 74 y.o.   MRN: [id number]  Dysuria  This is a new problem. Episode onset: week and a half. Associated symptoms comments: abd pain.   Headache for the past week and a half    Patient report reports progressive aching and chills for now over a week. Next  Diminished appetite and now virtually no appetite the last couple days. Next  Nausea time so no vomiting. Next  Some dysuria intermittently.  Chills did not actually measure fever.  Review of Systems  Genitourinary: Positive for dysuria.   no headache no chest pain some mild low back pain     Objective:   Physical Exam  Alert moderate malaise. Temp 102 lungs clear heart regular rate and rhythm. No true CVA tenderness. Abdomen bowel sounds present somewhat hyperactive low suprapubic tenderness impressive  Urinalysis numerous white blood cells    Assessment & Plan:  Impression abdominal pain accompanied by fever chills dysuria and yet no CVA tenderness. Something here does not add up. Very long discussion held with patient. She is enormously resistant to further workup urgently. I also spoke with the emergency room doctor. Also spoke with triage nurse. 35-40 minutes spent most in discussion plan urgent referral. Concerned that this represents more than urinary tract infection. Likely true intra-abdominal process WSL

## 2015-01-13 NOTE — H&P (Signed)
[facility] History and Physical  Christine Newman CVE:[id number] DOB: 1941/09/06 DOA: 01/13/2015  Referring physician: Dr. Vanita Panda - [facility] PCP: Rubbie Battiest, MD   Chief Complaint: Abd pain  HPI: Christine Newman is a 74 y.o. female  Abdominal pain. Patient seen by her primary care physician today who sent her to the emergency room. Comes and goes. Worse w/ voiding and certain movement. Pain is located throughout lower abdomen. Alleviated w/ rest. Associate with fevers, chills, nausea, anorexia. No radiation. Patient reports being in her normal state of health prior to onset of symptoms.  Review of Systems:  Constitutional:  No fatigue, night sweats,.  HEENT:  No headaches, Difficulty swallowing,Tooth/dental problems,Sore throat,  No sneezing, itching, ear ache, nasal congestion, post nasal drip,  Cardio-vascular:  No chest pain, Orthopnea, PND, swelling in lower extremities, anasarca, dizziness, palpitations  GI: Per history of present illness  Resp:   No shortness of breath with exertion or at rest. No excess mucus, no productive cough, No non-productive cough, No coughing up of blood.No change in color of mucus.No wheezing.No chest wall deformity  Skin:  no rash or lesions.  GU:  no dysuria, change in color of urine, no urgency or frequency. No flank pain.  Musculoskeletal:   No joint pain or swelling. No decreased range of motion. No back pain.  Psych:  No change in mood or affect. No depression or anxiety. No memory loss.   Past Medical History  Diagnosis Date  . Hypertension   . Creatinine elevation    Past Surgical History  Procedure Laterality Date  . Kidney stone surgery    . Colonoscopy     Social History:  reports that she has never smoked. She does not have any smokeless tobacco history on file. She reports that she does not drink alcohol or use illicit drugs.  No Known Allergies  Family History  Problem Relation Age of Onset  . Hypertension Mother    . Heart disease Father   . Cancer Sister     breast     Prior to Admission medications   Medication Sig Start Date End Date Taking? Authorizing Provider  Calcium Carbonate-Vitamin D (CALTRATE 600+D) 600-400 MG-UNIT per tablet Take 1 tablet by mouth 2 (two) times daily.   Yes Historical Provider, MD  Cholecalciferol (VITAMIN D-3 PO) Take by mouth. One daily   Yes Historical Provider, MD  folic acid (FOLVITE) 025 MCG tablet Take by mouth daily.   Yes Historical Provider, MD  hydrochlorothiazide (HYDRODIURIL) 25 MG tablet one tab qam 10/09/14  Yes Mikey Kirschner, MD  Multiple Vitamins-Minerals (MULTIVITAMIN WITH MINERALS) tablet Take 1 tablet by mouth daily.   Yes Historical Provider, MD  verapamil (CALAN-SR) 180 MG CR tablet Take 1 tablet (180 mg total) by mouth at bedtime. 10/09/14  Yes Mikey Kirschner, MD  ondansetron (ZOFRAN ODT) 4 MG disintegrating tablet Take 1 tablet (4 mg total) by mouth every 8 (eight) hours as needed for nausea or vomiting. Patient not taking: Reported on 01/13/2015 08/27/13   Mikey Kirschner, MD   Physical Exam: Filed Vitals:   01/13/15 2100 01/13/15 2116 01/13/15 2130 01/13/15 2225  BP: 109/82  111/63 133/62  Pulse: 89  84 85  Temp:  99.6 F (37.6 C)  99.2 F (37.3 C)  TempSrc:  Oral  Oral  Resp:    20  Height:    5' 6"  (1.676 m)  Weight:    71.442 kg (157 lb 8 oz)  SpO2: 95%  93% 98%    Wt Readings from Last 3 Encounters:  01/13/15 71.442 kg (157 lb 8 oz)  01/13/15 72.213 kg (159 lb 3.2 oz)  11/06/14 74.844 kg (165 lb)    General:  Appears calm and comfortable Eyes:  PERRL, normal lids, irises & conjunctiva ENT:  grossly normal hearing, lips & tongue Neck:  no LAD, masses or thyromegaly Cardiovascular:  RRR, no m/r/g. No LE edema. Telemetry:  SR, no arrhythmias  Respiratory:  CTA bilaterally, no w/r/r. Normal respiratory effort. Abdomen:  Lower abdominal tenderness to deep palpation in left and right lower quadrants. Murphy sign negative.  Nontender at McBurney's point. Nondistended. Normoactive bowel sounds  Skin:  no rash or induration seen on limited exam Musculoskeletal:  grossly normal tone BUE/BLE Psychiatric:  grossly normal mood and affect, speech fluent and appropriate Neurologic:  grossly non-focal.          Labs on Admission:  Basic Metabolic Panel:  Recent Labs Lab 01/13/15 1900  NA 136  K 3.1*  CL 98  CO2 27  GLUCOSE 113*  BUN 20  CREATININE 1.91*  CALCIUM 8.9   Liver Function Tests:  Recent Labs Lab 01/13/15 1900  AST 16  ALT 10  ALKPHOS 70  BILITOT 0.6  PROT 6.7  ALBUMIN 3.0*    Recent Labs Lab 01/13/15 1900  LIPASE 27   No results for input(s): AMMONIA in the last 168 hours. CBC:  Recent Labs Lab 01/13/15 1900  WBC 14.9*  NEUTROABS 12.5*  HGB 11.9*  HCT 37.0  MCV 90.5  PLT 338   Cardiac Enzymes: No results for input(s): CKTOTAL, CKMB, CKMBINDEX, TROPONINI in the last 168 hours.  BNP (last 3 results) No results for input(s): BNP in the last 8760 hours.  ProBNP (last 3 results) No results for input(s): PROBNP in the last 8760 hours.  CBG: No results for input(s): GLUCAP in the last 168 hours.  Radiological Exams on Admission: Ct Abdomen Pelvis Wo Contrast  01/13/2015   CLINICAL DATA:  Suprapubic abdominal pain and nausea for 10 days. Low-grade fever.  EXAM: CT ABDOMEN AND PELVIS WITHOUT CONTRAST  TECHNIQUE: Multidetector CT imaging of the abdomen and pelvis was performed following the standard protocol without IV contrast.  COMPARISON:  None.  FINDINGS: Lower chest: The lung bases are clear. No pleural effusion. The heart is normal in size. No pericardial effusion.  Hepatobiliary: No focal hepatic lesions or intrahepatic biliary dilatation. The gallbladder is grossly normal. No common bile duct dilatation.  Pancreas: No inflammatory changes, mass lesion or ductal dilatation.  Spleen: Normal size.  No focal lesions.  Adrenals/Urinary Tract: The adrenal glands are normal.  The left kidney is quite small and demonstrates marked renal cortical thinning. There are bilateral renal calculi with a large staghorn type calculus in the left renal pelvis measuring a maximum of 19 mm. No hydronephrosis or ureteral calculi. No bladder calculi.  Stomach/Bowel: The stomach, and duodenum are unremarkable. There is an inflammatory process in the mid central pelvis between the distal ileum in the sigmoid colon. Both of these areas are inflamed. This could be a diverticular abscess from the colon was involving the small bowel or possibly a small bowel perforation within abscess and between the two.  Vascular/Lymphatic: No mesenteric or retroperitoneal mass or adenopathy. Moderate aortic calcifications.  Other: The bladder, uterus and ovaries are unremarkable. No free pelvic fluid. No inguinal mass or adenopathy.  Musculoskeletal: No significant bony findings. Moderate degenerative changes involving the spine.  IMPRESSION: Complex area  of inflammation and probable developing abscess measuring 3.9 x 2.8 cm in the mid central pelvis between the sigmoid colon and the distal ileum.  Bilateral renal calculi with a large calculus in the left renal pelvis and severe chronic left renal atrophy.   Electronically Signed   By: Marijo Sanes M.D.   On: 01/13/2015 20:23    Assessment/Plan Principal Problem:   Diverticulitis of intestine with abscess Active Problems:   Essential hypertension, benign   AKI (acute kidney injury)   Hypokalemia   UTI (urinary tract infection)   Abdominal pain: Likely secondary to diverticulitis and possible formation of abscess between the sigmoid colon and distal ileum, as well as UTI. WBC 14.9. Lactic acid 1.03. UA positive for nitrites, small leukocytes, many bacteria, WBC too numerous to count. UA contaminated sample with squamous cells.. Dr. Arnoldo Morale of general surgery consulted by ED. No signs of sepsis - MedSurg - Cipro Flagyl - Follow-up blood culture - Follow-up  Gen. surgery recommendations - Clear liquid diet - Morphine - Follow-up urine culture - Pyridium  Hypertension: Did not take medications today but normotensive. Patient likely mildly dehydrated. - Continue HCTZ and verapamil in the morning.  AKI: Creatinine 1.9. No previous to compare. No history of kidney disease. 3 secondary to dehydration from very little by mouth over the last several days. - IV fluids - Chemistry in the morning  Hypokalemia: - KCl 10 mEq 5 - EKG - Mag level  Code Status: FULL DVT Prophylaxis: Heparin Family Communication: None Disposition Plan: pending improvement  Harm Jou Lenna Sciara, MD Family Medicine [facility] [email or web address] Password [email or web address]

## 2015-01-14 ENCOUNTER — Encounter (HOSPITAL_COMMUNITY): Payer: Self-pay | Admitting: Internal Medicine

## 2015-01-14 DIAGNOSIS — N183 Chronic kidney disease, stage 3 unspecified: Secondary | ICD-10-CM | POA: Diagnosis present

## 2015-01-14 DIAGNOSIS — K5720 Diverticulitis of large intestine with perforation and abscess without bleeding: Secondary | ICD-10-CM | POA: Diagnosis present

## 2015-01-14 DIAGNOSIS — N179 Acute kidney failure, unspecified: Secondary | ICD-10-CM | POA: Diagnosis present

## 2015-01-14 LAB — CBC
HCT: 33.3 % — ABNORMAL LOW (ref 36.0–46.0)
Hemoglobin: 10.6 g/dL — ABNORMAL LOW (ref 12.0–15.0)
MCH: 28.9 pg (ref 26.0–34.0)
MCHC: 31.8 g/dL (ref 30.0–36.0)
MCV: 90.7 fL (ref 78.0–100.0)
Platelets: 322 10*3/uL (ref 150–400)
RBC: 3.67 MIL/uL — ABNORMAL LOW (ref 3.87–5.11)
RDW: 12.5 % (ref 11.5–15.5)
WBC: 14 10*3/uL — ABNORMAL HIGH (ref 4.0–10.5)

## 2015-01-14 LAB — COMPREHENSIVE METABOLIC PANEL
ALT: 10 U/L (ref 0–35)
AST: 14 U/L (ref 0–37)
Albumin: 2.5 g/dL — ABNORMAL LOW (ref 3.5–5.2)
Alkaline Phosphatase: 56 U/L (ref 39–117)
Anion gap: 9 (ref 5–15)
BILIRUBIN TOTAL: 0.6 mg/dL (ref 0.3–1.2)
BUN: 16 mg/dL (ref 6–23)
CALCIUM: 8.7 mg/dL (ref 8.4–10.5)
CHLORIDE: 101 mmol/L (ref 96–112)
CO2: 28 mmol/L (ref 19–32)
Creatinine, Ser: 1.8 mg/dL — ABNORMAL HIGH (ref 0.50–1.10)
GFR calc Af Amer: 31 mL/min — ABNORMAL LOW (ref >90)
GFR calc non Af Amer: 27 mL/min — ABNORMAL LOW (ref >90)
Glucose, Bld: 102 mg/dL — ABNORMAL HIGH (ref 70–99)
POTASSIUM: 4.1 mmol/L (ref 3.5–5.1)
SODIUM: 138 mmol/L (ref 135–145)
Total Protein: 5.6 g/dL — ABNORMAL LOW (ref 6.0–8.3)

## 2015-01-14 LAB — MAGNESIUM: MAGNESIUM: 1.4 mg/dL — AB (ref 1.5–2.5)

## 2015-01-14 MED ORDER — MAGNESIUM SULFATE 2 GM/50ML IV SOLN
2.0000 g | Freq: Once | INTRAVENOUS | Status: AC
  Administered 2015-01-14: 2 g via INTRAVENOUS
  Filled 2015-01-14: qty 50

## 2015-01-14 MED ORDER — CIPROFLOXACIN IN D5W 400 MG/200ML IV SOLN
400.0000 mg | INTRAVENOUS | Status: DC
  Administered 2015-01-15 – 2015-01-16 (×2): 400 mg via INTRAVENOUS
  Filled 2015-01-14 (×2): qty 200

## 2015-01-14 MED ORDER — POTASSIUM CHLORIDE IN NACL 20-0.9 MEQ/L-% IV SOLN
INTRAVENOUS | Status: DC
  Administered 2015-01-14 – 2015-01-15 (×3): via INTRAVENOUS
  Administered 2015-01-16 – 2015-01-17 (×2): 1 mL via INTRAVENOUS

## 2015-01-14 MED ORDER — HYDROMORPHONE HCL 1 MG/ML IJ SOLN: 0.5000 mg | INTRAMUSCULAR | Status: DC | PRN

## 2015-01-14 NOTE — Care Management Note (Addendum)
    Page 1 of 1   01/17/2015     11:18:59 AM CARE MANAGEMENT NOTE 01/17/2015  Patient:  Christine Newman, Christine Newman   Account Number:  [id number]  Date Initiated:  01/14/2015  Documentation initiated by:  Theophilus Kinds  Subjective/Objective Assessment:   Pt admitted from home with diverticulitis and abcess. Pt lives with her son and will return home at discharge. Pt is independent with ADL's.     Action/Plan:   No CM needs noted.   Anticipated DC Date:  01/17/2015   Anticipated DC Plan:  [place]  CM consult      Choice offered to / List presented to:             Status of service:  Completed, signed off Medicare Important Message given?  YES (If response is "NO", the following Medicare IM given date fields will be blank) Date Medicare IM given:  01/17/2015 Medicare IM given by:  Theophilus Kinds Date Additional Medicare IM given:   Additional Medicare IM given by:    Discharge Disposition:  HOME/SELF CARE  Per UR Regulation:    If discussed at Long Length of Stay Meetings, dates discussed:    Comments:  01/17/15 [place], RN BSN Cm Pt discharged home today. No CM needs noted.  01/14/15 [place], RN BSN CM

## 2015-01-14 NOTE — Plan of Care (Signed)
Problem: Phase I Progression Outcomes Goal: Pain controlled with appropriate interventions Outcome: Completed/Met Date Met:  01/14/15 AMBULATES TO BATHROOM

## 2015-01-14 NOTE — Progress Notes (Signed)
UR chart review completed.  

## 2015-01-14 NOTE — Consult Note (Signed)
Reason for Consult: Intra-abdominal abscess Referring Physician: Hospitalist  Christine Newman is an 74 y.o. female.  HPI: Patient is a 74 year old white female who presented to her primary care physician with one-week history of worsening abdominal pain and fevers. Vision was sent to the emergency room and a CT scan the abdomen revealed an intra-abdominal abscess. Patient states that she has been feeling ill for over a week. She last had colonoscopy in the remote past. She denies any diarrhea.  Past Medical History  Diagnosis Date  . Hypertension   . CKD (chronic kidney disease)     Cr 1.9 -09/2014    Past Surgical History  Procedure Laterality Date  . Kidney stone surgery    . Colonoscopy      Family History  Problem Relation Age of Onset  . Hypertension Mother   . Heart disease Father   . Cancer Sister     breast    Social History:  reports that she has never smoked. She does not have any smokeless tobacco history on file. She reports that she does not drink alcohol or use illicit drugs.  Allergies: No Known Allergies  Medications: I have reviewed the patient's current medications.  Results for orders placed or performed during the hospital encounter of 01/13/15 (from the past 48 hour(s))  CBC WITH DIFFERENTIAL     Status: Abnormal   Collection Time: 01/13/15  7:00 PM  Result Value Ref Range   WBC 14.9 (H) 4.0 - 10.5 K/uL   RBC 4.09 3.87 - 5.11 MIL/uL   Hemoglobin 11.9 (L) 12.0 - 15.0 g/dL   HCT 37.0 36.0 - 46.0 %   MCV 90.5 78.0 - 100.0 fL   MCH 29.1 26.0 - 34.0 pg   MCHC 32.2 30.0 - 36.0 g/dL   RDW 12.6 11.5 - 15.5 %   Platelets 338 150 - 400 K/uL   Neutrophils Relative % 85 (H) 43 - 77 %   Neutro Abs 12.5 (H) 1.7 - 7.7 K/uL   Lymphocytes Relative 8 (L) 12 - 46 %   Lymphs Abs 1.2 0.7 - 4.0 K/uL   Monocytes Relative 7 3 - 12 %   Monocytes Absolute 1.1 (H) 0.1 - 1.0 K/uL   Eosinophils Relative 0 0 - 5 %   Eosinophils Absolute 0.1 0.0 - 0.7 K/uL   Basophils  Relative 0 0 - 1 %   Basophils Absolute 0.0 0.0 - 0.1 K/uL  Comprehensive metabolic panel     Status: Abnormal   Collection Time: 01/13/15  7:00 PM  Result Value Ref Range   Sodium 136 135 - 145 mmol/L   Potassium 3.1 (L) 3.5 - 5.1 mmol/L   Chloride 98 96 - 112 mmol/L   CO2 27 19 - 32 mmol/L   Glucose, Bld 113 (H) 70 - 99 mg/dL   BUN 20 6 - 23 mg/dL   Creatinine, Ser 1.91 (H) 0.50 - 1.10 mg/dL   Calcium 8.9 8.4 - 10.5 mg/dL   Total Protein 6.7 6.0 - 8.3 g/dL   Albumin 3.0 (L) 3.5 - 5.2 g/dL   AST 16 0 - 37 U/L   ALT 10 0 - 35 U/L   Alkaline Phosphatase 70 39 - 117 U/L   Total Bilirubin 0.6 0.3 - 1.2 mg/dL   GFR calc non Af Amer 25 (L) >90 mL/min   GFR calc Af Amer 29 (L) >90 mL/min    Comment: (NOTE) The eGFR has been calculated using the CKD EPI equation. This calculation  has not been validated in all clinical situations. eGFR's persistently <90 mL/min signify possible Chronic Kidney Disease.    Anion gap 11 5 - 15  Lipase, blood     Status: None   Collection Time: 01/13/15  7:00 PM  Result Value Ref Range   Lipase 27 11 - 59 U/L  Protime-INR     Status: None   Collection Time: 01/13/15  7:00 PM  Result Value Ref Range   Prothrombin Time 14.8 11.6 - 15.2 seconds   INR 1.14 0.00 - 1.49  Urinalysis with microscopic     Status: Abnormal   Collection Time: 01/13/15  7:08 PM  Result Value Ref Range   Color, Urine YELLOW YELLOW   APPearance CLEAR CLEAR   Specific Gravity, Urine >1.030 (H) 1.005 - 1.030   pH 5.5 5.0 - 8.0   Glucose, UA NEGATIVE NEGATIVE mg/dL   Hgb urine dipstick MODERATE (A) NEGATIVE   Bilirubin Urine NEGATIVE NEGATIVE   Ketones, ur TRACE (A) NEGATIVE mg/dL   Protein, ur TRACE (A) NEGATIVE mg/dL   Urobilinogen, UA 0.2 0.0 - 1.0 mg/dL   Nitrite POSITIVE (A) NEGATIVE   Leukocytes, UA SMALL (A) NEGATIVE  Urine microscopic-add on     Status: Abnormal   Collection Time: 01/13/15  7:08 PM  Result Value Ref Range   Squamous Epithelial / LPF MANY (A) RARE    WBC, UA TOO NUMEROUS TO COUNT <3 WBC/hpf   RBC / HPF 3-6 <3 RBC/hpf   Bacteria, UA MANY (A) RARE  I-Stat CG4 Lactic Acid, ED     Status: None   Collection Time: 01/13/15  7:09 PM  Result Value Ref Range   Lactic Acid, Venous 1.03 0.5 - 2.0 mmol/L  Comprehensive metabolic panel     Status: Abnormal   Collection Time: 01/14/15  6:06 AM  Result Value Ref Range   Sodium 138 135 - 145 mmol/L   Potassium 4.1 3.5 - 5.1 mmol/L    Comment: DELTA CHECK NOTED   Chloride 101 96 - 112 mmol/L   CO2 28 19 - 32 mmol/L   Glucose, Bld 102 (H) 70 - 99 mg/dL   BUN 16 6 - 23 mg/dL   Creatinine, Ser 1.80 (H) 0.50 - 1.10 mg/dL   Calcium 8.7 8.4 - 10.5 mg/dL   Total Protein 5.6 (L) 6.0 - 8.3 g/dL   Albumin 2.5 (L) 3.5 - 5.2 g/dL   AST 14 0 - 37 U/L   ALT 10 0 - 35 U/L   Alkaline Phosphatase 56 39 - 117 U/L   Total Bilirubin 0.6 0.3 - 1.2 mg/dL   GFR calc non Af Amer 27 (L) >90 mL/min   GFR calc Af Amer 31 (L) >90 mL/min    Comment: (NOTE) The eGFR has been calculated using the CKD EPI equation. This calculation has not been validated in all clinical situations. eGFR's persistently <90 mL/min signify possible Chronic Kidney Disease.    Anion gap 9 5 - 15  CBC     Status: Abnormal   Collection Time: 01/14/15  6:06 AM  Result Value Ref Range   WBC 14.0 (H) 4.0 - 10.5 K/uL   RBC 3.67 (L) 3.87 - 5.11 MIL/uL   Hemoglobin 10.6 (L) 12.0 - 15.0 g/dL   HCT 33.3 (L) 36.0 - 46.0 %   MCV 90.7 78.0 - 100.0 fL   MCH 28.9 26.0 - 34.0 pg   MCHC 31.8 30.0 - 36.0 g/dL   RDW 12.5 11.5 - 15.5 %  Platelets 322 150 - 400 K/uL  Magnesium     Status: Abnormal   Collection Time: 01/14/15  6:06 AM  Result Value Ref Range   Magnesium 1.4 (L) 1.5 - 2.5 mg/dL    Ct Abdomen Pelvis Wo Contrast  01/13/2015   CLINICAL DATA:  Suprapubic abdominal pain and nausea for 10 days. Low-grade fever.  EXAM: CT ABDOMEN AND PELVIS WITHOUT CONTRAST  TECHNIQUE: Multidetector CT imaging of the abdomen and pelvis was performed following  the standard protocol without IV contrast.  COMPARISON:  None.  FINDINGS: Lower chest: The lung bases are clear. No pleural effusion. The heart is normal in size. No pericardial effusion.  Hepatobiliary: No focal hepatic lesions or intrahepatic biliary dilatation. The gallbladder is grossly normal. No common bile duct dilatation.  Pancreas: No inflammatory changes, mass lesion or ductal dilatation.  Spleen: Normal size.  No focal lesions.  Adrenals/Urinary Tract: The adrenal glands are normal. The left kidney is quite small and demonstrates marked renal cortical thinning. There are bilateral renal calculi with a large staghorn type calculus in the left renal pelvis measuring a maximum of 19 mm. No hydronephrosis or ureteral calculi. No bladder calculi.  Stomach/Bowel: The stomach, and duodenum are unremarkable. There is an inflammatory process in the mid central pelvis between the distal ileum in the sigmoid colon. Both of these areas are inflamed. This could be a diverticular abscess from the colon was involving the small bowel or possibly a small bowel perforation within abscess and between the two.  Vascular/Lymphatic: No mesenteric or retroperitoneal mass or adenopathy. Moderate aortic calcifications.  Other: The bladder, uterus and ovaries are unremarkable. No free pelvic fluid. No inguinal mass or adenopathy.  Musculoskeletal: No significant bony findings. Moderate degenerative changes involving the spine.  IMPRESSION: Complex area of inflammation and probable developing abscess measuring 3.9 x 2.8 cm in the mid central pelvis between the sigmoid colon and the distal ileum.  Bilateral renal calculi with a large calculus in the left renal pelvis and severe chronic left renal atrophy.   Electronically Signed   By: Marijo Sanes M.D.   On: 01/13/2015 20:23    ROS: See chart Blood pressure 103/60, pulse 70, temperature 99.8 F (37.7 C), temperature source Oral, resp. rate 16, height _0  (1.676 m), weight  71.442 kg (157 lb 8 oz), SpO2 99 %. Physical Exam: Pleasant white female appears fatigued. Abdomen soft with fullness and tenderness in the suprapubic region. No rigidity is noted. No hernias are noted.  Assessment/Plan: Impression: Intra-abdominal abscess most likely secondary to sigmoid diverticulitis with contained abscess. Small bowel perforation less likely. Plan: We will consult interventional radiology for drainage of the abscess. This has been explained to the patient. No need for acute surgical intervention at this time.  Danyeal Akens A 01/14/2015, 8:16 AM

## 2015-01-14 NOTE — Progress Notes (Signed)
Patient c/o chills temp 101.6 medicated for elevated tempature.

## 2015-01-14 NOTE — Progress Notes (Signed)
[facility] PROGRESS NOTE  Christine Newman TXM:[id number] DOB: 1941/01/20 DOA: 01/13/2015 PCP: Rubbie Battiest, MD  Assessment/Plan: Diverticulitis and possible formation of abscess between the sigmoid colon and distal ileum confirmed CT in setting of UTI. Max temp 100.3.  WBC 14.0. Down from 14.9 on admission. Less abdominal pain this am. Continue cipro and flagyl day #2.  Dr. Arnoldo Morale of general surgery evaluated and recommended drain and IR opined abscess not mature. Will start clear liquids and monitor closely.  No signs of sepsis  UTI: antibiotics as above. Await urine culture  Hypertension: fair control.  Continue HCTZ and verapamil.  AKI: chart review indicates some chronic component. Creatinine 1.9 on 12/15 as well as on admission. Creatinine trending downward slightly. Urine output good. Continue gentle IV hydration. monitor  Hypokalemia: resolved s/p  KCl 10 mEq 5. Magnesium level 1.4. Will replete and recheck in am. EKG NSR.   Code Status: full Family Communication: none present Disposition Plan: home when ready   Consultants:  Dr Arnoldo Morale general surger  Procedures:  none  Antibiotics:  cipro 01/13/15>>  Flagyl 01/13/15>>  HPI/Subjective: Ambulating in room with steady gait. Reports less abdominal pain  Objective: Filed Vitals:   01/14/15 0500  BP: 103/60  Pulse: 70  Temp: 99.8 F (37.7 C)  Resp: 16    Intake/Output Summary (Last 24 hours) at 01/14/15 1315 Last data filed at 01/14/15 0643  Gross per 24 hour  Intake 910.42 ml  Output    925 ml  Net -14.58 ml   Filed Weights   01/13/15 1820 01/13/15 2225  Weight: 71.668 kg (158 lb) 71.442 kg (157 lb 8 oz)    Exam:   General:  Well nourished appears comfortable  Cardiovascular: RRR no MGR No LE edema  Respiratory: normal effort BS clear bilaterally  Abdomen: soft +BS mild tenderness in lower quadrants but no rebounding or guarding  Musculoskeletal: joints without swelling/erythema   Data  Reviewed: Basic Metabolic Panel:  Recent Labs Lab 01/13/15 1900 01/14/15 0606  NA 136 138  K 3.1* 4.1  CL 98 101  CO2 27 28  GLUCOSE 113* 102*  BUN 20 16  CREATININE 1.91* 1.80*  CALCIUM 8.9 8.7  MG  --  1.4*   Liver Function Tests:  Recent Labs Lab 01/13/15 1900 01/14/15 0606  AST 16 14  ALT 10 10  ALKPHOS 70 56  BILITOT 0.6 0.6  PROT 6.7 5.6*  ALBUMIN 3.0* 2.5*    Recent Labs Lab 01/13/15 1900  LIPASE 27   No results for input(s): AMMONIA in the last 168 hours. CBC:  Recent Labs Lab 01/13/15 1900 01/14/15 0606  WBC 14.9* 14.0*  NEUTROABS 12.5*  --   HGB 11.9* 10.6*  HCT 37.0 33.3*  MCV 90.5 90.7  PLT 338 322   Cardiac Enzymes: No results for input(s): CKTOTAL, CKMB, CKMBINDEX, TROPONINI in the last 168 hours. BNP (last 3 results) No results for input(s): BNP in the last 8760 hours.  ProBNP (last 3 results) No results for input(s): PROBNP in the last 8760 hours.  CBG: No results for input(s): GLUCAP in the last 168 hours.  No results found for this or any previous visit (from the past 240 hour(s)).   Studies: Ct Abdomen Pelvis Wo Contrast  01/13/2015   CLINICAL DATA:  Suprapubic abdominal pain and nausea for 10 days. Low-grade fever.  EXAM: CT ABDOMEN AND PELVIS WITHOUT CONTRAST  TECHNIQUE: Multidetector CT imaging of the abdomen and pelvis was performed following the standard protocol without  IV contrast.  COMPARISON:  None.  FINDINGS: Lower chest: The lung bases are clear. No pleural effusion. The heart is normal in size. No pericardial effusion.  Hepatobiliary: No focal hepatic lesions or intrahepatic biliary dilatation. The gallbladder is grossly normal. No common bile duct dilatation.  Pancreas: No inflammatory changes, mass lesion or ductal dilatation.  Spleen: Normal size.  No focal lesions.  Adrenals/Urinary Tract: The adrenal glands are normal. The left kidney is quite small and demonstrates marked renal cortical thinning. There are bilateral  renal calculi with a large staghorn type calculus in the left renal pelvis measuring a maximum of 19 mm. No hydronephrosis or ureteral calculi. No bladder calculi.  Stomach/Bowel: The stomach, and duodenum are unremarkable. There is an inflammatory process in the mid central pelvis between the distal ileum in the sigmoid colon. Both of these areas are inflamed. This could be a diverticular abscess from the colon was involving the small bowel or possibly a small bowel perforation within abscess and between the two.  Vascular/Lymphatic: No mesenteric or retroperitoneal mass or adenopathy. Moderate aortic calcifications.  Other: The bladder, uterus and ovaries are unremarkable. No free pelvic fluid. No inguinal mass or adenopathy.  Musculoskeletal: No significant bony findings. Moderate degenerative changes involving the spine.  IMPRESSION: Complex area of inflammation and probable developing abscess measuring 3.9 x 2.8 cm in the mid central pelvis between the sigmoid colon and the distal ileum.  Bilateral renal calculi with a large calculus in the left renal pelvis and severe chronic left renal atrophy.   Electronically Signed   By: Marijo Sanes M.D.   On: 01/13/2015 20:23    Scheduled Meds: . ciprofloxacin  400 mg Intravenous Q12H  . hydrochlorothiazide  25 mg Oral Daily  . magnesium sulfate 1 - 4 g bolus IVPB  2 g Intravenous Once  . metronidazole  500 mg Intravenous Q8H  . ondansetron  4 mg Intravenous Once  . phenazopyridine  200 mg Oral TID WC  . verapamil  180 mg Oral QHS   Continuous Infusions: . 0.9 % NaCl with KCl 20 mEq / L 125 mL/hr at 01/14/15 2518    Principal Problem:   Diverticulitis of intestine with abscess Active Problems:   Essential hypertension, benign   Hypokalemia   UTI (lower urinary tract infection)   CKD (chronic kidney disease) stage 3, GFR 30-59 ml/min   AKI (acute kidney injury)   Hypomagnesemia    Time spent: 3o minutes    [facility] Pager [phone]. If 7PM-7AM, please contact night-coverage at [email or web address], password [facility] 01/14/2015, 1:15 PM  LOS: 1 day

## 2015-01-14 NOTE — Progress Notes (Signed)
ANTIBIOTIC CONSULT NOTE - INITIAL  Pharmacy Consult for Cipro Indication: intra-abdominal infection  No Known Allergies  Patient Measurements: Height: 5' 6"  (167.6 cm) Weight: 157 lb 8 oz (71.442 kg) IBW/kg (Calculated) : 59.3  Vital Signs: Temp: 99.1 F (37.3 C) (04/05 1500) Temp Source: Oral (04/05 0500) BP: 136/66 mmHg (04/05 1500) Pulse Rate: 76 (04/05 1500) Intake/Output from previous day: 04/04 0701 - 04/05 0700 In: 910.4 [I.V.:910.4] Out: 925 [Urine:925] Intake/Output from this shift: Total I/O In: 1258.8 [P.O.:240; I.V.:568.8; IV Piggyback:450] Out: -   Labs:  Recent Labs  01/13/15 1900 01/14/15 0606  WBC 14.9* 14.0*  HGB 11.9* 10.6*  PLT 338 322  CREATININE 1.91* 1.80*   Estimated Creatinine Clearance: 28.6 mL/min (by C-G formula based on Cr of 1.8). No results for input(s): VANCOTROUGH, VANCOPEAK, VANCORANDOM, GENTTROUGH, GENTPEAK, GENTRANDOM, TOBRATROUGH, TOBRAPEAK, TOBRARND, AMIKACINPEAK, AMIKACINTROU, AMIKACIN in the last 72 hours.   Microbiology: No results found for this or any previous visit (from the past 720 hour(s)).  Medical History: Past Medical History  Diagnosis Date  . Hypertension   . CKD (chronic kidney disease)     Cr 1.9 -09/2014   Cipro 4/4 >> Flagyl 4/4 >>  Assessment: 74yo female with diverticulitis and possible abscess formation between the sigmoid colon and distal ileum.  Asked to dose Cipro.  Pt also on Flagyl.  SCr is elevated.  Estimated clcr 25-26m/min.  Goal of Therapy:  Eradicate infection.  Plan:  Cipro 405mIV q24hrs (renally adjusted) Continue Flagyl 50071mV q8h (no adjustment needed) Monitor labs, renal fxn, and cultures  [place] A 01/14/2015,4:50 PM

## 2015-01-15 DIAGNOSIS — K5780 Diverticulitis of intestine, part unspecified, with perforation and abscess without bleeding: Secondary | ICD-10-CM

## 2015-01-15 LAB — CBC
HCT: 31.9 % — ABNORMAL LOW (ref 36.0–46.0)
Hemoglobin: 10.2 g/dL — ABNORMAL LOW (ref 12.0–15.0)
MCH: 29 pg (ref 26.0–34.0)
MCHC: 32 g/dL (ref 30.0–36.0)
MCV: 90.6 fL (ref 78.0–100.0)
PLATELETS: 306 10*3/uL (ref 150–400)
RBC: 3.52 MIL/uL — ABNORMAL LOW (ref 3.87–5.11)
RDW: 12.6 % (ref 11.5–15.5)
WBC: 11.8 10*3/uL — AB (ref 4.0–10.5)

## 2015-01-15 LAB — BASIC METABOLIC PANEL
ANION GAP: 10 (ref 5–15)
BUN: 14 mg/dL (ref 6–23)
CO2: 22 mmol/L (ref 19–32)
CREATININE: 1.66 mg/dL — AB (ref 0.50–1.10)
Calcium: 8.5 mg/dL (ref 8.4–10.5)
Chloride: 106 mmol/L (ref 96–112)
GFR calc Af Amer: 34 mL/min — ABNORMAL LOW (ref >90)
GFR calc non Af Amer: 30 mL/min — ABNORMAL LOW (ref >90)
GLUCOSE: 87 mg/dL (ref 70–99)
Potassium: 3.6 mmol/L (ref 3.5–5.1)
Sodium: 138 mmol/L (ref 135–145)

## 2015-01-15 MED ORDER — ONDANSETRON HCL 4 MG/2ML IJ SOLN
4.0000 mg | INTRAMUSCULAR | Status: DC
  Administered 2015-01-15 – 2015-01-16 (×3): 4 mg via INTRAVENOUS
  Filled 2015-01-15 (×3): qty 2

## 2015-01-15 MED ORDER — ONDANSETRON HCL 4 MG PO TABS
4.0000 mg | ORAL_TABLET | ORAL | Status: DC
  Administered 2015-01-15 – 2015-01-17 (×9): 4 mg via ORAL
  Filled 2015-01-15 (×9): qty 1

## 2015-01-15 NOTE — Progress Notes (Signed)
[facility] PROGRESS NOTE  KHRISTA Newman GMW:[id number] DOB: [birth year] DOA: 01/13/2015 PCP: Rubbie Battiest, MD  Assessment/Plan: Diverticulitis and possible formation of abscess between the sigmoid colon and distal ileum confirmed CT in setting of UTI. Max temp 101.6. WBC tending down.Denies abdominal pain. Continues with nausea with meals.  Continue cipro and flagyl day #3.will change zofran to scheduled. Advancing diet to full liquid per general surgery UTI: antibiotics as above. Await urine culture  Hypertension: fair control. Continue HCTZ and verapamil.  AKI: chart review indicates some chronic component. Creatinine 1.9 on 12/15 as well as on admission. Creatinine continues to trend down. Urine output good. Continue gentle IV hydration. monitor  Hypokalemia: resolved s/p KCl 10 mEq 5. Magnesium level 1.4. .   Code Status: full Family Communication: son at bedside Disposition Plan: home when ready   Consultants:  General surgery  Procedures:  none  Antibiotics:  cipro 01/13/15>>  Flagyl 01/13/15>>   HPI/Subjective: Reports no abdominal pain but immediate dry heaving if tries to eat clear liquid. No nausea inbetween meals.   Objective: Filed Vitals:   01/15/15 1014  BP:   Pulse:   Temp: 98.7 F (37.1 C)  Resp:     Intake/Output Summary (Last 24 hours) at 01/15/15 1021 Last data filed at 01/15/15 4403  Gross per 24 hour  Intake 3418.75 ml  Output    100 ml  Net 3318.75 ml   Filed Weights   01/13/15 1820 01/13/15 2225  Weight: 71.668 kg (158 lb) 71.442 kg (157 lb 8 oz)    Exam:   General:  Appears comfortable  Cardiovascular: S1 and S2. No MGR no LE edema PPP  Respiratory: normal effort BS clear to auscultation bilaterally no wheeze  Abdomen: flat soft +BS but sluggish mild tenderness lower quadrant  Musculoskeletal: no clubbing or cyanosis   Data Reviewed: Basic Metabolic Panel:  Recent Labs Lab 01/13/15 1900 01/14/15 0606  01/15/15 0817  NA 136 138 138  K 3.1* 4.1 3.6  CL 98 101 106  CO2 27 28 22   GLUCOSE 113* 102* 87  BUN 20 16 14   CREATININE 1.91* 1.80* 1.66*  CALCIUM 8.9 8.7 8.5  MG  --  1.4*  --    Liver Function Tests:  Recent Labs Lab 01/13/15 1900 01/14/15 0606  AST 16 14  ALT 10 10  ALKPHOS 70 56  BILITOT 0.6 0.6  PROT 6.7 5.6*  ALBUMIN 3.0* 2.5*    Recent Labs Lab 01/13/15 1900  LIPASE 27   No results for input(s): AMMONIA in the last 168 hours. CBC:  Recent Labs Lab 01/13/15 1900 01/14/15 0606 01/15/15 0817  WBC 14.9* 14.0* 11.8*  NEUTROABS 12.5*  --   --   HGB 11.9* 10.6* 10.2*  HCT 37.0 33.3* 31.9*  MCV 90.5 90.7 90.6  PLT 338 322 306   Cardiac Enzymes: No results for input(s): CKTOTAL, CKMB, CKMBINDEX, TROPONINI in the last 168 hours. BNP (last 3 results) No results for input(s): BNP in the last 8760 hours.  ProBNP (last 3 results) No results for input(s): PROBNP in the last 8760 hours.  CBG: No results for input(s): GLUCAP in the last 168 hours.  No results found for this or any previous visit (from the past 240 hour(s)).   Studies: Ct Abdomen Pelvis Wo Contrast  01/13/2015   CLINICAL DATA:  Suprapubic abdominal pain and nausea for 10 days. Low-grade fever.  EXAM: CT ABDOMEN AND PELVIS WITHOUT CONTRAST  TECHNIQUE: Multidetector CT imaging of the abdomen and  pelvis was performed following the standard protocol without IV contrast.  COMPARISON:  None.  FINDINGS: Lower chest: The lung bases are clear. No pleural effusion. The heart is normal in size. No pericardial effusion.  Hepatobiliary: No focal hepatic lesions or intrahepatic biliary dilatation. The gallbladder is grossly normal. No common bile duct dilatation.  Pancreas: No inflammatory changes, mass lesion or ductal dilatation.  Spleen: Normal size.  No focal lesions.  Adrenals/Urinary Tract: The adrenal glands are normal. The left kidney is quite small and demonstrates marked renal cortical thinning. There  are bilateral renal calculi with a large staghorn type calculus in the left renal pelvis measuring a maximum of 19 mm. No hydronephrosis or ureteral calculi. No bladder calculi.  Stomach/Bowel: The stomach, and duodenum are unremarkable. There is an inflammatory process in the mid central pelvis between the distal ileum in the sigmoid colon. Both of these areas are inflamed. This could be a diverticular abscess from the colon was involving the small bowel or possibly a small bowel perforation within abscess and between the two.  Vascular/Lymphatic: No mesenteric or retroperitoneal mass or adenopathy. Moderate aortic calcifications.  Other: The bladder, uterus and ovaries are unremarkable. No free pelvic fluid. No inguinal mass or adenopathy.  Musculoskeletal: No significant bony findings. Moderate degenerative changes involving the spine.  IMPRESSION: Complex area of inflammation and probable developing abscess measuring 3.9 x 2.8 cm in the mid central pelvis between the sigmoid colon and the distal ileum.  Bilateral renal calculi with a large calculus in the left renal pelvis and severe chronic left renal atrophy.   Electronically Signed   By: Marijo Sanes M.D.   On: 01/13/2015 20:23    Scheduled Meds: . ciprofloxacin  400 mg Intravenous Q24H  . hydrochlorothiazide  25 mg Oral Daily  . metronidazole  500 mg Intravenous Q8H  . ondansetron  4 mg Intravenous Once  . ondansetron  4 mg Oral Q4H   Or  . ondansetron (ZOFRAN) IV  4 mg Intravenous Q4H  . phenazopyridine  200 mg Oral TID WC  . verapamil  180 mg Oral QHS   Continuous Infusions: . 0.9 % NaCl with KCl 20 mEq / L 50 mL/hr at 01/15/15 9702    Principal Problem:   Diverticulitis of intestine with abscess Active Problems:   Essential hypertension, benign   Hypokalemia   UTI (lower urinary tract infection)   CKD (chronic kidney disease) stage 3, GFR 30-59 ml/min   AKI (acute kidney injury)   Hypomagnesemia   Diverticulitis of large  intestine with abscess without bleeding    Time spent: 30 minutes    [facility] Pager [phone]. If 7PM-7AM, please contact night-coverage at [email or web address], password [email or web address] 01/15/2015, 10:21 AM  LOS: 2 days

## 2015-01-15 NOTE — Progress Notes (Signed)
Subjective: Minimal abdominal pain. Has had multiple bowel movements.  Objective: Vital signs in last 24 hours: Temp:  [98.9 F (37.2 C)-101.6 F (38.7 C)] 98.9 F (37.2 C) (04/06 0540) Pulse Rate:  [76-83] 83 (04/06 0540) Resp:  [16-20] 20 (04/06 0540) BP: (111-136)/(55-66) 134/64 mmHg (04/06 0540) SpO2:  [95 %-99 %] 95 % (04/06 0540) Last BM Date: 01/14/15  Intake/Output from previous day: 04/05 0701 - 04/06 0700 In: 3418.8 [P.O.:480; I.V.:2288.8; IV Piggyback:650] Out: 100 [Urine:100] Intake/Output this shift:    General appearance: alert, cooperative and no distress GI: Soft with no rigidity noted. Nondistended. Minimal discomfort to deep palpation in the suprapubic region.  Lab Results:   Recent Labs  01/14/15 0606 01/15/15 0817  WBC 14.0* 11.8*  HGB 10.6* 10.2*  HCT 33.3* 31.9*  PLT 322 306   BMET  Recent Labs  01/14/15 0606 01/15/15 0817  NA 138 138  K 4.1 3.6  CL 101 106  CO2 28 22  GLUCOSE 102* 87  BUN 16 14  CREATININE 1.80* 1.66*  CALCIUM 8.7 8.5   PT/INR  Recent Labs  01/13/15 1900  LABPROT 14.8  INR 1.14    Studies/Results: Ct Abdomen Pelvis Wo Contrast  01/13/2015   CLINICAL DATA:  Suprapubic abdominal pain and nausea for 10 days. Low-grade fever.  EXAM: CT ABDOMEN AND PELVIS WITHOUT CONTRAST  TECHNIQUE: Multidetector CT imaging of the abdomen and pelvis was performed following the standard protocol without IV contrast.  COMPARISON:  None.  FINDINGS: Lower chest: The lung bases are clear. No pleural effusion. The heart is normal in size. No pericardial effusion.  Hepatobiliary: No focal hepatic lesions or intrahepatic biliary dilatation. The gallbladder is grossly normal. No common bile duct dilatation.  Pancreas: No inflammatory changes, mass lesion or ductal dilatation.  Spleen: Normal size.  No focal lesions.  Adrenals/Urinary Tract: The adrenal glands are normal. The left kidney is quite small and demonstrates marked renal cortical  thinning. There are bilateral renal calculi with a large staghorn type calculus in the left renal pelvis measuring a maximum of 19 mm. No hydronephrosis or ureteral calculi. No bladder calculi.  Stomach/Bowel: The stomach, and duodenum are unremarkable. There is an inflammatory process in the mid central pelvis between the distal ileum in the sigmoid colon. Both of these areas are inflamed. This could be a diverticular abscess from the colon was involving the small bowel or possibly a small bowel perforation within abscess and between the two.  Vascular/Lymphatic: No mesenteric or retroperitoneal mass or adenopathy. Moderate aortic calcifications.  Other: The bladder, uterus and ovaries are unremarkable. No free pelvic fluid. No inguinal mass or adenopathy.  Musculoskeletal: No significant bony findings. Moderate degenerative changes involving the spine.  IMPRESSION: Complex area of inflammation and probable developing abscess measuring 3.9 x 2.8 cm in the mid central pelvis between the sigmoid colon and the distal ileum.  Bilateral renal calculi with a large calculus in the left renal pelvis and severe chronic left renal atrophy.   Electronically Signed   By: Marijo Sanes M.D.   On: 01/13/2015 20:23    Anti-infectives: Anti-infectives    Start     Dose/Rate Route Frequency Ordered Stop   01/15/15 1200  ciprofloxacin (CIPRO) IVPB 400 mg     400 mg 200 mL/hr over 60 Minutes Intravenous Every 24 hours 01/14/15 1632     01/14/15 0500  metroNIDAZOLE (FLAGYL) IVPB 500 mg     500 mg 100 mL/hr over 60 Minutes Intravenous Every 8 hours  01/13/15 2234     01/13/15 2300  ciprofloxacin (CIPRO) IVPB 400 mg  Status:  Discontinued     400 mg 200 mL/hr over 60 Minutes Intravenous Every 12 hours 01/13/15 2234 01/14/15 1632   01/13/15 2045  cefTRIAXone (ROCEPHIN) 1 g in dextrose 5 % 50 mL IVPB     1 g 100 mL/hr over 30 Minutes Intravenous  Once 01/13/15 2036 01/13/15 2149   01/13/15 2045  metroNIDAZOLE (FLAGYL)  IVPB 500 mg     500 mg 100 mL/hr over 60 Minutes Intravenous  Once 01/13/15 2036 01/13/15 2249      Assessment/Plan: Impression: Intra-abdominal abscess most likely secondary to sigmoid diverticulitis. It was not able to be drained percutaneously as it was immature in nature. Her leukocytosis has improved. We'll advance to full liquid diet. Will decrease peripheral IV fluids.  LOS: 2 days    Christine Newman A 01/15/2015

## 2015-01-16 ENCOUNTER — Inpatient Hospital Stay (HOSPITAL_COMMUNITY): Payer: Medicare Other

## 2015-01-16 DIAGNOSIS — K6389 Other specified diseases of intestine: Secondary | ICD-10-CM | POA: Diagnosis present

## 2015-01-16 DIAGNOSIS — K5700 Diverticulitis of small intestine with perforation and abscess without bleeding: Secondary | ICD-10-CM

## 2015-01-16 LAB — CBC
HCT: 32.2 % — ABNORMAL LOW (ref 36.0–46.0)
Hemoglobin: 10.1 g/dL — ABNORMAL LOW (ref 12.0–15.0)
MCH: 28.6 pg (ref 26.0–34.0)
MCHC: 31.4 g/dL (ref 30.0–36.0)
MCV: 91.2 fL (ref 78.0–100.0)
Platelets: 317 10*3/uL (ref 150–400)
RBC: 3.53 MIL/uL — ABNORMAL LOW (ref 3.87–5.11)
RDW: 12.6 % (ref 11.5–15.5)
WBC: 10.7 10*3/uL — AB (ref 4.0–10.5)

## 2015-01-16 LAB — BASIC METABOLIC PANEL
ANION GAP: 7 (ref 5–15)
BUN: 9 mg/dL (ref 6–23)
CO2: 27 mmol/L (ref 19–32)
Calcium: 8.5 mg/dL (ref 8.4–10.5)
Chloride: 105 mmol/L (ref 96–112)
Creatinine, Ser: 1.66 mg/dL — ABNORMAL HIGH (ref 0.50–1.10)
GFR calc Af Amer: 34 mL/min — ABNORMAL LOW (ref >90)
GFR, EST NON AFRICAN AMERICAN: 30 mL/min — AB (ref >90)
Glucose, Bld: 104 mg/dL — ABNORMAL HIGH (ref 70–99)
POTASSIUM: 3.1 mmol/L — AB (ref 3.5–5.1)
Sodium: 139 mmol/L (ref 135–145)

## 2015-01-16 LAB — MAGNESIUM: MAGNESIUM: 1.5 mg/dL (ref 1.5–2.5)

## 2015-01-16 MED ORDER — POTASSIUM CHLORIDE CRYS ER 20 MEQ PO TBCR
40.0000 meq | EXTENDED_RELEASE_TABLET | ORAL | Status: AC
  Administered 2015-01-16 (×2): 40 meq via ORAL
  Filled 2015-01-16 (×2): qty 2

## 2015-01-16 MED ORDER — IOHEXOL 300 MG/ML  SOLN
50.0000 mL | Freq: Once | INTRAMUSCULAR | Status: AC | PRN
  Administered 2015-01-16: 50 mL via ORAL

## 2015-01-16 NOTE — Consult Note (Addendum)
Referring Provider: No ref. provider found Primary Care Physician:  Rubbie Battiest, MD Primary Gastroenterologist:  Dr.  Date of Admission: 01/13/15 Date of Consultation: 01/16/15  Reason for Consultation:  Colon mass  HPI:  74 year old female admitted the hospital for abdominal pain likely secondary to diverticulitis and possible formation of an abscess between the sigmoid colon and distal ileum. She was started on empiric Cipro and Flagyl and evaluated by surgery who determined that surgery was not necessary at this time and recommended interventional radiology draining of the abscess. However, IR stated that the abscess is not mature and therefore not a candidate for draining. With continued antibiotics the patient began to improve, abdominal pain improved. Diet was advanced as tolerated. At this point abdominal pain is completely resolved. Leukocytosis is also improved from an initial reading of 14.9 down to 10.7 today. The patient is mildly anemic 10.1 with a high of 11.9 historically. Repeat CT of the abdomen and pelvis demonstrated again inflammatory mass in the mesentery of the central anatomic pelvis suspicious for phlegmon or developing abscess, which is similar to the previous study. However with oral contrast administration on today's exam there was outline of what appears to be eccentric colonic mass in the proximal sigmoid colon estimated to be measured at 4.4 x 2.2 x 3.8 cm. Radiology recommended correlation with colonoscopy.  Last colonoscopy per the patient was with Dr. Collene Mares about 15 years ago and, per the patient, there were no polyps removed. Admits some decreased appetite in the past month with associated weight loss. Has also had some increasing fatigue but not severe. Denies hematochezia, melena, unexplained fever/chills, and abdominal pain before her acute admission.  Past Medical History  Diagnosis Date  . Hypertension   . CKD (chronic kidney disease)     Cr 1.9 -09/2014     Past Surgical History  Procedure Laterality Date  . Kidney stone surgery    . Colonoscopy      Prior to Admission medications   Medication Sig Start Date End Date Taking? Authorizing Provider  Calcium Carbonate-Vitamin D (CALTRATE 600+D) 600-400 MG-UNIT per tablet Take 1 tablet by mouth 2 (two) times daily.   Yes Historical Provider, MD  Cholecalciferol (VITAMIN D-3 PO) Take by mouth. One daily   Yes Historical Provider, MD  folic acid (FOLVITE) 433 MCG tablet Take by mouth daily.   Yes Historical Provider, MD  hydrochlorothiazide (HYDRODIURIL) 25 MG tablet one tab qam 10/09/14  Yes Mikey Kirschner, MD  Multiple Vitamins-Minerals (MULTIVITAMIN WITH MINERALS) tablet Take 1 tablet by mouth daily.   Yes Historical Provider, MD  verapamil (CALAN-SR) 180 MG CR tablet Take 1 tablet (180 mg total) by mouth at bedtime. 10/09/14  Yes Mikey Kirschner, MD  ondansetron (ZOFRAN ODT) 4 MG disintegrating tablet Take 1 tablet (4 mg total) by mouth every 8 (eight) hours as needed for nausea or vomiting. Patient not taking: Reported on 01/13/2015 08/27/13   Mikey Kirschner, MD    Current Facility-Administered Medications  Medication Dose Route Frequency Provider Last Rate Last Dose  . 0.9 % NaCl with KCl 20 mEq/ L  infusion   Intravenous Continuous Aviva Signs Md, MD 50 mL/hr at 01/16/15 0116 1 mL at 01/16/15 0116  . acetaminophen (TYLENOL) tablet 650 mg  650 mg Oral Q6H PRN Waldemar Dickens, MD   650 mg at 01/16/15 0541   Or  . acetaminophen (TYLENOL) suppository 650 mg  650 mg Rectal Q6H PRN Waldemar Dickens, MD   650 mg  at 01/14/15 1905  . ciprofloxacin (CIPRO) IVPB 400 mg  400 mg Intravenous Q24H Rexene Alberts, MD   400 mg at 01/16/15 1200  . hydrochlorothiazide (HYDRODIURIL) tablet 25 mg  25 mg Oral Daily Waldemar Dickens, MD   25 mg at 01/16/15 0859  . HYDROmorphone (DILAUDID) injection 0.5 mg  0.5 mg Intravenous Q4H PRN Rexene Alberts, MD      . metroNIDAZOLE (FLAGYL) IVPB 500 mg  500 mg  Intravenous Q8H Waldemar Dickens, MD   500 mg at 01/16/15 1404  . morphine 4 MG/ML injection 4 mg  4 mg Intravenous Q4H PRN Waldemar Dickens, MD      . ondansetron [facility]) injection 4 mg  4 mg Intravenous Once Carmin Muskrat, MD   4 mg at 01/13/15 1917  . ondansetron (ZOFRAN) tablet 4 mg  4 mg Oral Q4H Radene Gunning, NP   4 mg at 01/16/15 1403   Or  . ondansetron (ZOFRAN) injection 4 mg  4 mg Intravenous Q4H Radene Gunning, NP   4 mg at 01/16/15 0900  . polyethylene glycol (MIRALAX / GLYCOLAX) packet 17 g  17 g Oral Daily PRN Waldemar Dickens, MD      . potassium chloride SA (K-DUR,KLOR-CON) CR tablet 40 mEq  40 mEq Oral Q4H Radene Gunning, NP   40 mEq at 01/16/15 1200  . verapamil (CALAN-SR) CR tablet 180 mg  180 mg Oral QHS Waldemar Dickens, MD   180 mg at 01/15/15 2147    Allergies as of 01/13/2015  . (No Known Allergies)    Family History  Problem Relation Age of Onset  . Hypertension Mother   . Heart disease Father   . Cancer Sister     breast    History   Social History  . Marital Status: Widowed    Spouse Name: N/A  . Number of Children: N/A  . Years of Education: N/A   Occupational History  . Not on file.   Social History Main Topics  . Smoking status: Never Smoker   . Smokeless tobacco: Not on file  . Alcohol Use: No  . Drug Use: No  . Sexual Activity: Not on file   Other Topics Concern  . Not on file   Social History Narrative    Review of Systems: See HPI. Gen: Denies fever, chills CV: Denies chest pain, heart palpitations, syncope, edema  Resp: Denies shortness of breath with rest, cough, wheezing GI: See HPI. Denies dysphagia or odynophagia. Denies vomiting blood, jaundice, and fecal incontinence.  Derm: Denies rash, itching, dry skin Psych: Denies depression, anxiety,confusion, or memory loss Heme: Denies bruising, bleeding, and enlarged lymph nodes.  Physical Exam: Vital signs in last 24 hours: Temp:  [98.3 F (36.8 C)-102.3 F (39.1 C)] 98.5 F  (36.9 C) (04/07 1434) Pulse Rate:  [65-89] 72 (04/07 1434) Resp:  [20] 20 (04/07 1434) BP: (107-133)/(54-101) 127/101 mmHg (04/07 1434) SpO2:  [95 %-98 %] 96 % (04/07 1434) Last BM Date: 01/15/15 General:   Alert,  Well-developed, well-nourished, pleasant and cooperative in NAD Head:  Normocephalic and atraumatic. Eyes:  Sclera clear, no icterus. Conjunctiva pink. Ears:  Normal auditory acuity. Neck:  Supple; no masses or thyromegaly. Lungs:  Clear throughout to auscultation.   No wheezes, crackles, or rhonchi. No acute distress. Heart:  Regular rate and rhythm; no murmurs, clicks, rubs,  or gallops. Abdomen:  Soft, nontender and nondistended. No masses, hepatosplenomegaly or hernias noted. Normal bowel sounds, without guarding,  and without rebound.   Rectal:  Deferred.   Msk:  Symmetrical without gross deformities. Normal posture. Pulses:  Normal DP pulses noted. Extremities:  Without clubbing or edema. Neurologic:  Alert and  oriented x4;  grossly normal neurologically. Skin:  Intact without significant lesions or rashes. Psych:  Alert and cooperative. Normal mood and affect.  Intake/Output from previous day: 04/06 0701 - 04/07 0700 In: 1792.5 [P.O.:720; I.V.:772.5; IV Piggyback:300] Out: -  Intake/Output this shift: Total I/O In: 480 [P.O.:480] Out: -   Lab Results:  Recent Labs  01/14/15 0606 01/15/15 0817 01/16/15 0447  WBC 14.0* 11.8* 10.7*  HGB 10.6* 10.2* 10.1*  HCT 33.3* 31.9* 32.2*  PLT 322 306 317   BMET  Recent Labs  01/14/15 0606 01/15/15 0817 01/16/15 0447  NA 138 138 139  K 4.1 3.6 3.1*  CL 101 106 105  CO2 28 22 27   GLUCOSE 102* 87 104*  BUN 16 14 9   CREATININE 1.80* 1.66* 1.66*  CALCIUM 8.7 8.5 8.5   LFT  Recent Labs  01/13/15 1900 01/14/15 0606  PROT 6.7 5.6*  ALBUMIN 3.0* 2.5*  AST 16 14  ALT 10 10  ALKPHOS 70 56  BILITOT 0.6 0.6   PT/INR  Recent Labs  01/13/15 1900  LABPROT 14.8  INR 1.14   Hepatitis Panel No  results for input(s): HEPBSAG, HCVAB, HEPAIGM, HEPBIGM in the last 72 hours. C-Diff No results for input(s): CDIFFTOX in the last 72 hours.  Studies/Results: Ct Abdomen Pelvis Wo Contrast  01/16/2015   CLINICAL DATA:  74 year old female with history of tenderness in the lower abdomen bilaterally, with chills and nausea for the past several days. Evaluate for intra-abdominal abscess or diverticulitis.  EXAM: CT ABDOMEN AND PELVIS WITHOUT CONTRAST  TECHNIQUE: Multidetector CT imaging of the abdomen and pelvis was performed following the standard protocol without IV contrast.  COMPARISON:  CT of the abdomen and pelvis 01/13/2015.  FINDINGS: Lower chest: Minimal subsegmental atelectasis in the dependent portions of the lower lobes of the lungs bilaterally. Atherosclerotic calcifications in the left anterior descending, left circumflex and right coronary arteries.  Hepatobiliary: No definite cystic or solid hepatic lesions on today's noncontrast CT examination. The unenhanced appearance of the gallbladder is normal.  Pancreas: Unremarkable.  Spleen: Unremarkable.  Adrenals/Urinary Tract: 15 x 11 mm calculus in the left renal pelvis again noted. Severe atrophy of the left kidney. Tiny nonobstructive calculi in the right renal collecting system, largest of which measures 4 mm. No ureteral stones, and no hydroureteronephrosis to indicate urinary tract obstruction at this time. The unenhanced appearance of the urinary bladder is unremarkable. Bilateral adrenal glands are normal in appearance.  Stomach/Bowel: The unenhanced appearance of the stomach is normal. No pathologic dilatation of small bowel or colon. In the proximal sigmoid colon, adjacent to the previously suspected abscess, there is an area of profound asymmetric mural thickening of the proximal sigmoid colon best appreciated on image 72 of series 2, and coronal image 38 of series 3, measuring approximately 4.4 x 2.2 x 3.8 cm. Adjacent to this there is again a  poorly defined mass-like area of soft tissue attenuation measuring approximately 4.8 x 3.9 x 4.1 cm, with surrounding soft tissue stranding, which extends medially from the proximal sigmoid, and appears to come in contact with a loop of distal ileum.  Vascular/Lymphatic: Extensive atherosclerosis throughout the abdominal and pelvic vasculature, without definite aneurysm. No lymphadenopathy noted in the abdomen or pelvis on today's noncontrast CT examination.  Reproductive: Uterus and  ovaries are atrophic.  Other: Trace ascites.  No pneumoperitoneum.  Musculoskeletal: There are no aggressive appearing lytic or blastic lesions noted in the visualized portions of the skeleton.  IMPRESSION: 1. There is again an inflammatory mass in the mesentery of the central anatomic pelvis, as detailed above, suspicious for phlegmon or developing abscess. This is similar to the prior examination. Importantly, however, the oral contrast administered on today's examination now clearly outlines what appears to be an eccentric colonic mass in the proximal sigmoid colon, which is estimated to measure approximately 4.4 x 2.2 x 3.8 cm. Correlation with colonoscopy is recommended in the near future. 2. Nonobstructive calculi in the collecting systems of the kidneys bilaterally, largest of which on the left side measures 11 x 15 mm. 3. Severe left renal atrophy redemonstrated. 4. Normal appendix. 5. Additional incidental findings, as above.   Electronically Signed   By: Vinnie Langton M.D.   On: 01/16/2015 14:23    Impression: 74 year old female admitted for abdominal pain and possible diverticulitis with phlegmon versus abscess. Surgical consult completed and patient deemed no surgery necessary at this time. Consult with interventional radiology who determined that the abscess was to image her to drain. Repeat CT of the abdomen and pelvis again demonstrated inflammatory mass in the mesentery at the central anatomic pelvis suspicious  for phlegmon versus developing abscess but also noted what appears to be an eccentric colonic mass to proximal sigmoid colon measuring 4.4 x 2.2 x 3.8 cm with a recommended correlation with colonoscopy. Etiologies include inflammatory process versus diverticulitis versus colon cancer. Patient symptomatically continues to improve. Her abdominal pain is resolved and her nausea is improving. Antibiotics have been changed to scheduled dosing versus when necessary dosing. Today the patient is tolerating clear liquids as well as some applesauce. Leukocytosis is improving although remains elevated at 10.7 this morning. Mildly anemic which is had some drifting down in the past 3 days to a level of 10.1 g today, however this could be due to hydration effect. No noted overt GI bleeding.  Plan: 1. Continue IV antibiotics 2. Supportive measures 3. Continue scheduled antiemetics 4. Keep on clear liquids for the time being. 5. After discussion with Dr. Gala Romney it was felt best to hold off on colonoscopy for now due to perforation risk with localized inflammatory process. Will re-evaluate early next week.   Walden Field, AGNP-C Adult & Gerontological Nurse Practitioner [facility]    LOS: 3 days     01/16/2015, 4:15 PM  Attending note:  Patient seen and examined. Recent CT images reviewed with Dr. Thornton Papas.  More recent imaging demonstrated a mass-like effect in the setting of marked inflammation. It is difficult to discern whether or not there is actually a mass there. Clinically, she is improving on antibiotics. She is overdue for colonoscopy. However, as long as she is clinically improving on antibiotics I would favor a conservative approach to management including a complete course of antibiotics with a colonoscopy followed in about 6-8 weeks.  Would limit diet to clear liquids for now.

## 2015-01-16 NOTE — Progress Notes (Signed)
[facility] PROGRESS NOTE  Christine Newman RCV:[id number] DOB: [birth year] DOA: 01/13/2015 PCP: Rubbie Battiest, MD  Assessment/Plan: Diverticulitis and abscess between the sigmoid colon and distal ileum confirmed CT in setting of UTI. Max temp 102.3 WBC continues to trend down. Denies abdominal pain. Able to eat bites. Continue cipro and flagyl day #4.continue zofran scheduled.  continue full liquid per general surgery  UTI: antibiotics as above. Await urine culture  Hypertension: fair control. Continue HCTZ and verapamil.  AKI: chart review indicates some chronic component. Creatinine 1.9 on 12/15 as well as on admission. Creatinine continues to trend down. Urine output good. Continue gentle IV hydration. monitor  Hypokalemia:  Replete and recheck.    Code Status: full Family Communication: none present Disposition Plan: home when ready   Consultants:  General surger  Procedures:  none  Antibiotics:  cipro 01/13/15>>  Flagyl 01/13/15>>  HPI/Subjective: Denies pain/nausea  Objective: Filed Vitals:   01/16/15 0934  BP:   Pulse:   Temp: 98.3 F (36.8 C)  Resp:     Intake/Output Summary (Last 24 hours) at 01/16/15 1204 Last data filed at 01/16/15 0900  Gross per 24 hour  Intake 1792.5 ml  Output      0 ml  Net 1792.5 ml   Filed Weights   01/13/15 1820 01/13/15 2225  Weight: 71.668 kg (158 lb) 71.442 kg (157 lb 8 oz)    Exam:   General:  Well nourished appears comfortable  Cardiovascular: RRR no MGR No LE edema  Respiratory: normal effort BS clear to auscultation  Abdomen: non-distended +BS non-tender  Musculoskeletal: joints without swelling/erythema   Data Reviewed: Basic Metabolic Panel:  Recent Labs Lab 01/13/15 1900 01/14/15 0606 01/15/15 0817 01/16/15 0447  NA 136 138 138 139  K 3.1* 4.1 3.6 3.1*  CL 98 101 106 105  CO2 27 28 22 27   GLUCOSE 113* 102* 87 104*  BUN 20 16 14 9   CREATININE 1.91* 1.80* 1.66* 1.66*  CALCIUM 8.9  8.7 8.5 8.5  MG  --  1.4*  --   --    Liver Function Tests:  Recent Labs Lab 01/13/15 1900 01/14/15 0606  AST 16 14  ALT 10 10  ALKPHOS 70 56  BILITOT 0.6 0.6  PROT 6.7 5.6*  ALBUMIN 3.0* 2.5*    Recent Labs Lab 01/13/15 1900  LIPASE 27   No results for input(s): AMMONIA in the last 168 hours. CBC:  Recent Labs Lab 01/13/15 1900 01/14/15 0606 01/15/15 0817 01/16/15 0447  WBC 14.9* 14.0* 11.8* 10.7*  NEUTROABS 12.5*  --   --   --   HGB 11.9* 10.6* 10.2* 10.1*  HCT 37.0 33.3* 31.9* 32.2*  MCV 90.5 90.7 90.6 91.2  PLT 338 322 306 317   Cardiac Enzymes: No results for input(s): CKTOTAL, CKMB, CKMBINDEX, TROPONINI in the last 168 hours. BNP (last 3 results) No results for input(s): BNP in the last 8760 hours.  ProBNP (last 3 results) No results for input(s): PROBNP in the last 8760 hours.  CBG: No results for input(s): GLUCAP in the last 168 hours.  Recent Results (from the past 240 hour(s))  Culture, Urine     Status: None (Preliminary result)   Collection Time: 01/13/15  7:08 PM  Result Value Ref Range Status   Specimen Description URINE, CLEAN CATCH  Final   Special Requests NONE  Final   Colony Count PENDING  Incomplete   Culture   Final    Culture reincubated for better growth Performed  at [facility]    Report Status PENDING  Incomplete     Studies: No results found.  Scheduled Meds: . ciprofloxacin  400 mg Intravenous Q24H  . hydrochlorothiazide  25 mg Oral Daily  . metronidazole  500 mg Intravenous Q8H  . ondansetron  4 mg Intravenous Once  . ondansetron  4 mg Oral Q4H   Or  . ondansetron (ZOFRAN) IV  4 mg Intravenous Q4H  . potassium chloride  40 mEq Oral Q4H  . verapamil  180 mg Oral QHS   Continuous Infusions: . 0.9 % NaCl with KCl 20 mEq / L 1 mL (01/16/15 0116)    Principal Problem:   Diverticulitis of intestine with abscess Active Problems:   Essential hypertension, benign   Hypokalemia   UTI (lower urinary tract  infection)   CKD (chronic kidney disease) stage 3, GFR 30-59 ml/min   AKI (acute kidney injury)   Hypomagnesemia   Diverticulitis of large intestine with abscess without bleeding    Time spent: 35 minutes    [facility] Pager [phone]. If 7PM-7AM, please contact night-coverage at [email or web address], password [email or web address] 01/16/2015, 12:04 PM  LOS: 3 days

## 2015-01-16 NOTE — Progress Notes (Signed)
  Subjective: Patient had a fever spike along with chills this morning. She states her abdominal pain has resolved.  Objective: Vital signs in last 24 hours: Temp:  [98.4 F (36.9 C)-102.3 F (39.1 C)] 100 F (37.8 C) (04/07 0712) Pulse Rate:  [65-89] 65 (04/07 0856) Resp:  [20] 20 (04/07 0856) BP: (107-133)/(54-70) 113/57 mmHg (04/07 0856) SpO2:  [95 %-98 %] 95 % (04/07 0856) Last BM Date: 01/15/15  Intake/Output from previous day: 04/06 0701 - 04/07 0700 In: 1792.5 [P.O.:720; I.V.:772.5; IV Piggyback:300] Out: -  Intake/Output this shift:    General appearance: alert, cooperative, fatigued and no distress GI: soft, non-tender; bowel sounds normal; no masses,  no organomegaly  Lab Results:   Recent Labs  01/15/15 0817 01/16/15 0447  WBC 11.8* 10.7*  HGB 10.2* 10.1*  HCT 31.9* 32.2*  PLT 306 317   BMET  Recent Labs  01/15/15 0817 01/16/15 0447  NA 138 139  K 3.6 3.1*  CL 106 105  CO2 22 27  GLUCOSE 87 104*  BUN 14 9  CREATININE 1.66* 1.66*  CALCIUM 8.5 8.5   PT/INR  Recent Labs  01/13/15 1900  LABPROT 14.8  INR 1.14    Studies/Results: No results found.  Anti-infectives: Anti-infectives    Start     Dose/Rate Route Frequency Ordered Stop   01/15/15 1200  ciprofloxacin (CIPRO) IVPB 400 mg     400 mg 200 mL/hr over 60 Minutes Intravenous Every 24 hours 01/14/15 1632     01/14/15 0500  metroNIDAZOLE (FLAGYL) IVPB 500 mg     500 mg 100 mL/hr over 60 Minutes Intravenous Every 8 hours 01/13/15 2234     01/13/15 2300  ciprofloxacin (CIPRO) IVPB 400 mg  Status:  Discontinued     400 mg 200 mL/hr over 60 Minutes Intravenous Every 12 hours 01/13/15 2234 01/14/15 1632   01/13/15 2045  cefTRIAXone (ROCEPHIN) 1 g in dextrose 5 % 50 mL IVPB     1 g 100 mL/hr over 30 Minutes Intravenous  Once 01/13/15 2036 01/13/15 2149   01/13/15 2045  metroNIDAZOLE (FLAGYL) IVPB 500 mg     500 mg 100 mL/hr over 60 Minutes Intravenous  Once 01/13/15 2036 01/13/15  2249      Assessment/Plan: Impression: Intra-abdominal abscess most likely secondary to sigmoid diverticulitis. Continues to have fever spikes. Her leukocytosis has been resolving. Plan: We'll repeat CT scan of the abdomen to see whether her abscess has matured. Further management pending those results.  LOS: 3 days    Adam Demary A 01/16/2015

## 2015-01-16 NOTE — Progress Notes (Signed)
Temp 102.3.  Tylenol 650 mg po given.  Will recheck.

## 2015-01-17 ENCOUNTER — Encounter (HOSPITAL_COMMUNITY): Payer: Self-pay | Admitting: Internal Medicine

## 2015-01-17 LAB — BASIC METABOLIC PANEL
Anion gap: 7 (ref 5–15)
BUN: 5 mg/dL — ABNORMAL LOW (ref 6–23)
CO2: 27 mmol/L (ref 19–32)
Calcium: 8.8 mg/dL (ref 8.4–10.5)
Chloride: 109 mmol/L (ref 96–112)
Creatinine, Ser: 1.6 mg/dL — ABNORMAL HIGH (ref 0.50–1.10)
GFR calc Af Amer: 36 mL/min — ABNORMAL LOW (ref >90)
GFR calc non Af Amer: 31 mL/min — ABNORMAL LOW (ref >90)
Glucose, Bld: 98 mg/dL (ref 70–99)
POTASSIUM: 3.4 mmol/L — AB (ref 3.5–5.1)
SODIUM: 143 mmol/L (ref 135–145)

## 2015-01-17 LAB — CBC
HCT: 34.5 % — ABNORMAL LOW (ref 36.0–46.0)
Hemoglobin: 10.8 g/dL — ABNORMAL LOW (ref 12.0–15.0)
MCH: 28.6 pg (ref 26.0–34.0)
MCHC: 31.3 g/dL (ref 30.0–36.0)
MCV: 91.5 fL (ref 78.0–100.0)
PLATELETS: 330 10*3/uL (ref 150–400)
RBC: 3.77 MIL/uL — ABNORMAL LOW (ref 3.87–5.11)
RDW: 12.8 % (ref 11.5–15.5)
WBC: 8 10*3/uL (ref 4.0–10.5)

## 2015-01-17 MED ORDER — CIPROFLOXACIN HCL 500 MG PO TABS: 500.0000 mg | ORAL_TABLET | Freq: Two times a day (BID) | ORAL | Status: DC

## 2015-01-17 MED ORDER — FLEET ENEMA 7-19 GM/118ML RE ENEM: 1.0000 | ENEMA | Freq: Every day | RECTAL | Status: DC | PRN

## 2015-01-17 MED ORDER — ONDANSETRON 4 MG PO TBDP: 4.0000 mg | ORAL_TABLET | Freq: Three times a day (TID) | ORAL | Status: DC | PRN

## 2015-01-17 MED ORDER — METRONIDAZOLE 500 MG PO TABS: 500.0000 mg | ORAL_TABLET | Freq: Three times a day (TID) | ORAL | Status: DC

## 2015-01-17 MED ORDER — SENNA 8.6 MG PO TABS: 1.0000 | ORAL_TABLET | Freq: Every day | ORAL | Status: DC

## 2015-01-17 NOTE — Progress Notes (Signed)
UR chart review completed.  

## 2015-01-17 NOTE — Progress Notes (Signed)
Patient discharged with instructions, prescription, and care notes.  Verbalized understanding via teach back.  IV was removed and the site was WNL. Patient voiced no further complaints or concerns at the time of discharge.  Appointments scheduled per instructions.  Patient left the floor via ambulation with staff and family in stable condition.

## 2015-01-17 NOTE — Progress Notes (Signed)
Subjective: Patient feels better. Denies any abdominal pain. No fever or chills noted.  Objective: Vital signs in last 24 hours: Temp:  [97.8 F (36.6 C)-98.5 F (36.9 C)] 97.8 F (36.6 C) (04/08 0700) Pulse Rate:  [60-75] 60 (04/08 0700) Resp:  [19-20] 19 (04/08 0700) BP: (0-127)/(0-101) 119/60 mmHg (04/08 0700) SpO2:  [96 %-100 %] 100 % (04/08 0700) Last BM Date: 01/16/15  Intake/Output from previous day: 04/07 0701 - 04/08 0700 In: 2567.5 [P.O.:840; I.V.:1227.5; IV Piggyback:500] Out: -  Intake/Output this shift: Total I/O In: 360 [P.O.:360] Out: -   General appearance: alert, cooperative and no distress GI: soft, non-tender; bowel sounds normal; no masses,  no organomegaly  Lab Results:   Recent Labs  01/16/15 0447 01/17/15 0448  WBC 10.7* 8.0  HGB 10.1* 10.8*  HCT 32.2* 34.5*  PLT 317 330   BMET  Recent Labs  01/16/15 0447 01/17/15 0448  NA 139 143  K 3.1* 3.4*  CL 105 109  CO2 27 27  GLUCOSE 104* 98  BUN 9 5*  CREATININE 1.66* 1.60*  CALCIUM 8.5 8.8   PT/INR No results for input(s): LABPROT, INR in the last 72 hours.  Studies/Results: Ct Abdomen Pelvis Wo Contrast  01/16/2015   CLINICAL DATA:  74 year old female with history of tenderness in the lower abdomen bilaterally, with chills and nausea for the past several days. Evaluate for intra-abdominal abscess or diverticulitis.  EXAM: CT ABDOMEN AND PELVIS WITHOUT CONTRAST  TECHNIQUE: Multidetector CT imaging of the abdomen and pelvis was performed following the standard protocol without IV contrast.  COMPARISON:  CT of the abdomen and pelvis 01/13/2015.  FINDINGS: Lower chest: Minimal subsegmental atelectasis in the dependent portions of the lower lobes of the lungs bilaterally. Atherosclerotic calcifications in the left anterior descending, left circumflex and right coronary arteries.  Hepatobiliary: No definite cystic or solid hepatic lesions on today's noncontrast CT examination. The unenhanced  appearance of the gallbladder is normal.  Pancreas: Unremarkable.  Spleen: Unremarkable.  Adrenals/Urinary Tract: 15 x 11 mm calculus in the left renal pelvis again noted. Severe atrophy of the left kidney. Tiny nonobstructive calculi in the right renal collecting system, largest of which measures 4 mm. No ureteral stones, and no hydroureteronephrosis to indicate urinary tract obstruction at this time. The unenhanced appearance of the urinary bladder is unremarkable. Bilateral adrenal glands are normal in appearance.  Stomach/Bowel: The unenhanced appearance of the stomach is normal. No pathologic dilatation of small bowel or colon. In the proximal sigmoid colon, adjacent to the previously suspected abscess, there is an area of profound asymmetric mural thickening of the proximal sigmoid colon best appreciated on image 72 of series 2, and coronal image 38 of series 3, measuring approximately 4.4 x 2.2 x 3.8 cm. Adjacent to this there is again a poorly defined mass-like area of soft tissue attenuation measuring approximately 4.8 x 3.9 x 4.1 cm, with surrounding soft tissue stranding, which extends medially from the proximal sigmoid, and appears to come in contact with a loop of distal ileum.  Vascular/Lymphatic: Extensive atherosclerosis throughout the abdominal and pelvic vasculature, without definite aneurysm. No lymphadenopathy noted in the abdomen or pelvis on today's noncontrast CT examination.  Reproductive: Uterus and ovaries are atrophic.  Other: Trace ascites.  No pneumoperitoneum.  Musculoskeletal: There are no aggressive appearing lytic or blastic lesions noted in the visualized portions of the skeleton.  IMPRESSION: 1. There is again an inflammatory mass in the mesentery of the central anatomic pelvis, as detailed above, suspicious for  phlegmon or developing abscess. This is similar to the prior examination. Importantly, however, the oral contrast administered on today's examination now clearly outlines  what appears to be an eccentric colonic mass in the proximal sigmoid colon, which is estimated to measure approximately 4.4 x 2.2 x 3.8 cm. Correlation with colonoscopy is recommended in the near future. 2. Nonobstructive calculi in the collecting systems of the kidneys bilaterally, largest of which on the left side measures 11 x 15 mm. 3. Severe left renal atrophy redemonstrated. 4. Normal appendix. 5. Additional incidental findings, as above.   Electronically Signed   By: Vinnie Langton M.D.   On: 01/16/2015 14:23    Anti-infectives: Anti-infectives    Start     Dose/Rate Route Frequency Ordered Stop   01/17/15 0000  metroNIDAZOLE (FLAGYL) 500 MG tablet     500 mg Oral 3 times daily 01/17/15 1013     01/17/15 0000  ciprofloxacin (CIPRO) 500 MG tablet     500 mg Oral 2 times daily 01/17/15 1013     01/15/15 1200  ciprofloxacin (CIPRO) IVPB 400 mg     400 mg 200 mL/hr over 60 Minutes Intravenous Every 24 hours 01/14/15 1632     01/14/15 0500  metroNIDAZOLE (FLAGYL) IVPB 500 mg     500 mg 100 mL/hr over 60 Minutes Intravenous Every 8 hours 01/13/15 2234     01/13/15 2300  ciprofloxacin (CIPRO) IVPB 400 mg  Status:  Discontinued     400 mg 200 mL/hr over 60 Minutes Intravenous Every 12 hours 01/13/15 2234 01/14/15 1632   01/13/15 2045  cefTRIAXone (ROCEPHIN) 1 g in dextrose 5 % 50 mL IVPB     1 g 100 mL/hr over 30 Minutes Intravenous  Once 01/13/15 2036 01/13/15 2149   01/13/15 2045  metroNIDAZOLE (FLAGYL) IVPB 500 mg     500 mg 100 mL/hr over 60 Minutes Intravenous  Once 01/13/15 2036 01/13/15 2249      Assessment/Plan: Impression: Sigmoid colitis with mesenteric abscess, question proximal sigmoid colon neoplasm Plan: Patient okay for discharge from surgical standpoint. Agree with need for follow-up colonoscopy in 4-6 weeks. Would continue ciprofloxacin and Flagyl for 10 days. I will see her in my office in 2 weeks for follow-up.  LOS: 4 days    Roman Dubuc A 01/17/2015

## 2015-01-17 NOTE — Discharge Summary (Signed)
Physician Discharge Summary  Christine Newman UVO:[id number] DOB: Oct 05, 1941 DOA: 01/13/2015  PCP: Rubbie Battiest, MD  Admit date: 01/13/2015 Discharge date: 01/17/2015  Time spent: 40 minutes  Recommendations for Outpatient Follow-up:  1. Follow up with Dr Arnoldo Morale as scheduled for evaluation of diverticulitis and abscess 2. Follow up with Dr Gala Romney 6 weeks to discuss colonosclopy 3. PCP 2 weeks for BMET to track potassium  Discharge Diagnoses:  Principal Problem:   Diverticulitis of intestine with abscess Active Problems:   Essential hypertension, benign   Hypokalemia   UTI (lower urinary tract infection)   CKD (chronic kidney disease) stage 3, GFR 30-59 ml/min   AKI (acute kidney injury)   Hypomagnesemia   Diverticulitis of large intestine with abscess without bleeding   Colonic mass   Discharge Condition: stable  Diet recommendation: full liquids only.   Filed Weights   01/13/15 1820 01/13/15 2225  Weight: 71.668 kg (158 lb) 71.442 kg (157 lb 8 oz)    History of present illness:  Christine Newman is a 74 y.o. female presented to ED on 01/13/15 with cc abdominal pain. Patient seen by her primary care physician that day who sent her to the emergency room. Pain comes and goes. Worse w/ voiding and certain movement. Pain  located throughout lower abdomen. Alleviated w/ rest. Associate with fevers, chills, nausea, anorexia. No radiation. Patient reported being in her normal state of health prior to onset of symptoms.   Hospital Course:  Diverticulitis and abscess between the sigmoid colon and distal ileum confirmed CT in setting of UTI. Afebrile for 24 hours. WBC within limits of normal. Denies abdominal pain. Tolerating full liquid. Received  cipro and flagyl for 4 days. Will discharge with 10 day of cipro and flagyl to complete 14 day course. Follow up with Dr Arnoldo Morale as scheduled  Colonic mass: evaluated by GI woh opine imaging demonstrated mass-like effect in setting of marked  inflamation and difficult to discern if mass there. Recommending continuing antibiotics as she is clinically improving and follow up with GI in 6 weeks  UTI: antibiotics as above. Urine culture gram negative rods  Hypertension: fair control.   AKI: chart review indicates some chronic component. Creatinine 1.9 on 12/15 as well as on admission. Creatinine stable at 1.6 at discharge.  Urine output good.   Hypokalemia:3.4 on discharge. Will replete   Procedures:  none  Consultations:  Gastroenterology    Discharge Exam: Filed Vitals:   01/17/15 0700  BP: 119/60  Pulse: 60  Temp: 97.8 F (36.6 C)  Resp: 19    General: will nourished appears comfortable Cardiovascular: RRR no MGR Respiratory: normal effort BS clear Abdomen: soft +BS non-tender to palpation  Discharge Instructions    Current Discharge Medication List    START taking these medications   Details  ciprofloxacin (CIPRO) 500 MG tablet Take 1 tablet (500 mg total) by mouth 2 (two) times daily. Qty: 20 tablet, Refills: 0    metroNIDAZOLE (FLAGYL) 500 MG tablet Take 1 tablet (500 mg total) by mouth 3 (three) times daily. Qty: 30 tablet, Refills: 0      CONTINUE these medications which have NOT CHANGED   Details  Calcium Carbonate-Vitamin D (CALTRATE 600+D) 600-400 MG-UNIT per tablet Take 1 tablet by mouth 2 (two) times daily.    Cholecalciferol (VITAMIN D-3 PO) Take by mouth. One daily    folic acid (FOLVITE) 742 MCG tablet Take by mouth daily.    hydrochlorothiazide (HYDRODIURIL) 25 MG tablet one tab qam Qty:  30 tablet, Refills: 5    Multiple Vitamins-Minerals (MULTIVITAMIN WITH MINERALS) tablet Take 1 tablet by mouth daily.    verapamil (CALAN-SR) 180 MG CR tablet Take 1 tablet (180 mg total) by mouth at bedtime. Qty: 30 tablet, Refills: 5      STOP taking these medications     ondansetron (ZOFRAN ODT) 4 MG disintegrating tablet        No Known Allergies Follow-up Information    Follow  up with Aviva Signs A, MD. Schedule an appointment as soon as possible for a visit on 01/30/2015.   Specialty:  General Surgery   Contact information:   [address] [place] [address] [phone]        The results of significant diagnostics from this hospitalization (including imaging, microbiology, ancillary and laboratory) are listed below for reference.    Significant Diagnostic Studies: Ct Abdomen Pelvis Wo Contrast  01/16/2015   CLINICAL DATA:  74 year old female with history of tenderness in the lower abdomen bilaterally, with chills and nausea for the past several days. Evaluate for intra-abdominal abscess or diverticulitis.  EXAM: CT ABDOMEN AND PELVIS WITHOUT CONTRAST  TECHNIQUE: Multidetector CT imaging of the abdomen and pelvis was performed following the standard protocol without IV contrast.  COMPARISON:  CT of the abdomen and pelvis 01/13/2015.  FINDINGS: Lower chest: Minimal subsegmental atelectasis in the dependent portions of the lower lobes of the lungs bilaterally. Atherosclerotic calcifications in the left anterior descending, left circumflex and right coronary arteries.  Hepatobiliary: No definite cystic or solid hepatic lesions on today's noncontrast CT examination. The unenhanced appearance of the gallbladder is normal.  Pancreas: Unremarkable.  Spleen: Unremarkable.  Adrenals/Urinary Tract: 15 x 11 mm calculus in the left renal pelvis again noted. Severe atrophy of the left kidney. Tiny nonobstructive calculi in the right renal collecting system, largest of which measures 4 mm. No ureteral stones, and no hydroureteronephrosis to indicate urinary tract obstruction at this time. The unenhanced appearance of the urinary bladder is unremarkable. Bilateral adrenal glands are normal in appearance.  Stomach/Bowel: The unenhanced appearance of the stomach is normal. No pathologic dilatation of small bowel or colon. In the proximal sigmoid colon, adjacent to the previously  suspected abscess, there is an area of profound asymmetric mural thickening of the proximal sigmoid colon best appreciated on image 72 of series 2, and coronal image 38 of series 3, measuring approximately 4.4 x 2.2 x 3.8 cm. Adjacent to this there is again a poorly defined mass-like area of soft tissue attenuation measuring approximately 4.8 x 3.9 x 4.1 cm, with surrounding soft tissue stranding, which extends medially from the proximal sigmoid, and appears to come in contact with a loop of distal ileum.  Vascular/Lymphatic: Extensive atherosclerosis throughout the abdominal and pelvic vasculature, without definite aneurysm. No lymphadenopathy noted in the abdomen or pelvis on today's noncontrast CT examination.  Reproductive: Uterus and ovaries are atrophic.  Other: Trace ascites.  No pneumoperitoneum.  Musculoskeletal: There are no aggressive appearing lytic or blastic lesions noted in the visualized portions of the skeleton.  IMPRESSION: 1. There is again an inflammatory mass in the mesentery of the central anatomic pelvis, as detailed above, suspicious for phlegmon or developing abscess. This is similar to the prior examination. Importantly, however, the oral contrast administered on today's examination now clearly outlines what appears to be an eccentric colonic mass in the proximal sigmoid colon, which is estimated to measure approximately 4.4 x 2.2 x 3.8 cm. Correlation with colonoscopy is recommended in the near  future. 2. Nonobstructive calculi in the collecting systems of the kidneys bilaterally, largest of which on the left side measures 11 x 15 mm. 3. Severe left renal atrophy redemonstrated. 4. Normal appendix. 5. Additional incidental findings, as above.   Electronically Signed   By: Vinnie Langton M.D.   On: 01/16/2015 14:23   Ct Abdomen Pelvis Wo Contrast  01/13/2015   CLINICAL DATA:  Suprapubic abdominal pain and nausea for 10 days. Low-grade fever.  EXAM: CT ABDOMEN AND PELVIS WITHOUT CONTRAST   TECHNIQUE: Multidetector CT imaging of the abdomen and pelvis was performed following the standard protocol without IV contrast.  COMPARISON:  None.  FINDINGS: Lower chest: The lung bases are clear. No pleural effusion. The heart is normal in size. No pericardial effusion.  Hepatobiliary: No focal hepatic lesions or intrahepatic biliary dilatation. The gallbladder is grossly normal. No common bile duct dilatation.  Pancreas: No inflammatory changes, mass lesion or ductal dilatation.  Spleen: Normal size.  No focal lesions.  Adrenals/Urinary Tract: The adrenal glands are normal. The left kidney is quite small and demonstrates marked renal cortical thinning. There are bilateral renal calculi with a large staghorn type calculus in the left renal pelvis measuring a maximum of 19 mm. No hydronephrosis or ureteral calculi. No bladder calculi.  Stomach/Bowel: The stomach, and duodenum are unremarkable. There is an inflammatory process in the mid central pelvis between the distal ileum in the sigmoid colon. Both of these areas are inflamed. This could be a diverticular abscess from the colon was involving the small bowel or possibly a small bowel perforation within abscess and between the two.  Vascular/Lymphatic: No mesenteric or retroperitoneal mass or adenopathy. Moderate aortic calcifications.  Other: The bladder, uterus and ovaries are unremarkable. No free pelvic fluid. No inguinal mass or adenopathy.  Musculoskeletal: No significant bony findings. Moderate degenerative changes involving the spine.  IMPRESSION: Complex area of inflammation and probable developing abscess measuring 3.9 x 2.8 cm in the mid central pelvis between the sigmoid colon and the distal ileum.  Bilateral renal calculi with a large calculus in the left renal pelvis and severe chronic left renal atrophy.   Electronically Signed   By: Marijo Sanes M.D.   On: 01/13/2015 20:23    Microbiology: Recent Results (from the past 240 hour(s))   Culture, Urine     Status: None (Preliminary result)   Collection Time: 01/13/15  7:08 PM  Result Value Ref Range Status   Specimen Description URINE, CLEAN CATCH  Final   Special Requests NONE  Final   Colony Count   Final    >=100,000 COLONIES/ML Performed at [facility]    Culture   Final    [place] Performed at [facility]    Report Status PENDING  Incomplete     Labs: Basic Metabolic Panel:  Recent Labs Lab 01/13/15 1900 01/14/15 0606 01/15/15 0817 01/16/15 0447 01/16/15 0600 01/17/15 0448  NA 136 138 138 139  --  143  K 3.1* 4.1 3.6 3.1*  --  3.4*  CL 98 101 106 105  --  109  CO2 27 28 22 27   --  27  GLUCOSE 113* 102* 87 104*  --  98  BUN 20 16 14 9   --  5*  CREATININE 1.91* 1.80* 1.66* 1.66*  --  1.60*  CALCIUM 8.9 8.7 8.5 8.5  --  8.8  MG  --  1.4*  --   --  1.5  --  Liver Function Tests:  Recent Labs Lab 01/13/15 1900 01/14/15 0606  AST 16 14  ALT 10 10  ALKPHOS 70 56  BILITOT 0.6 0.6  PROT 6.7 5.6*  ALBUMIN 3.0* 2.5*    Recent Labs Lab 01/13/15 1900  LIPASE 27   No results for input(s): AMMONIA in the last 168 hours. CBC:  Recent Labs Lab 01/13/15 1900 01/14/15 0606 01/15/15 0817 01/16/15 0447 01/17/15 0448  WBC 14.9* 14.0* 11.8* 10.7* 8.0  NEUTROABS 12.5*  --   --   --   --   HGB 11.9* 10.6* 10.2* 10.1* 10.8*  HCT 37.0 33.3* 31.9* 32.2* 34.5*  MCV 90.5 90.7 90.6 91.2 91.5  PLT 338 322 306 317 330   Cardiac Enzymes: No results for input(s): CKTOTAL, CKMB, CKMBINDEX, TROPONINI in the last 168 hours. BNP: BNP (last 3 results) No results for input(s): BNP in the last 8760 hours.  ProBNP (last 3 results) No results for input(s): PROBNP in the last 8760 hours.  CBG: No results for input(s): GLUCAP in the last 168 hours.     SignedRadene Gunning  [facility] 01/17/2015, 10:15 AM

## 2015-01-18 LAB — URINE CULTURE

## 2015-02-28 ENCOUNTER — Encounter (INDEPENDENT_AMBULATORY_CARE_PROVIDER_SITE_OTHER): Payer: Self-pay

## 2015-02-28 ENCOUNTER — Other Ambulatory Visit: Payer: Self-pay

## 2015-02-28 ENCOUNTER — Encounter: Payer: Self-pay | Admitting: Internal Medicine

## 2015-02-28 ENCOUNTER — Ambulatory Visit (INDEPENDENT_AMBULATORY_CARE_PROVIDER_SITE_OTHER): Payer: Medicare Other | Admitting: Internal Medicine

## 2015-02-28 VITALS — BP 137/92 | HR 97 | Temp 97.6°F | Ht 66.0 in | Wt 152.2 lb

## 2015-02-28 DIAGNOSIS — R933 Abnormal findings on diagnostic imaging of other parts of digestive tract: Secondary | ICD-10-CM

## 2015-02-28 MED ORDER — PEG-KCL-NACL-NASULF-NA ASC-C 100 G PO SOLR: 1.0000 | Freq: Once | ORAL | Status: AC

## 2015-02-28 NOTE — Progress Notes (Signed)
Primary Care Physician:  Mickie Hillier, MD Primary Gastroenterologist:  Dr. Gala Romney  Pre-Procedure History & Physical: HPI:  Christine Newman is a 74 y.o. female here for followup recent hospitalization for probable diverticular abscess. Ms. Plantz was quite ill the first of last month. She has fully recovered. She feels very well. CT scan demonstrated abscess between her ileum and sigmoid colon;  she was seen by general surgery. Conservative therapy was recommended. She completed a protracted course of antibiotics. CT also suggested a mass in her sigmoid colon. Last colonoscopy was back in 2004 in [place]. 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 Patient currently denies abdominal pain nausea or vomiting. She has any rectal bleeding. She feels she is doing much, much better.  Past Medical History  Diagnosis Date  . Hypertension   . CKD (chronic kidney disease)     Cr 1.9 -09/2014  . Colonic mass   . Diverticulitis     with abscess  . Anal polyp 2005    condyloma acuminatum    Past Surgical History  Procedure Laterality Date  . Kidney stone surgery    . Colonoscopy  04/08/2003    Dr.Mann- small nonbleeding internal and external hemorrhoids small polypoid mass at anal verge, normal appearing L colon, transverse colon, R colon including cecum. stenotic ileocecal valve. bx of maa= benign rectal type mucosa with features of mucosal prolapse syndrome  . Flexible sigmoidoscopy  11/06/2003    Dr.Mann- small nodular mass at anal verge, bx done. early L side diverticula. bx= benign rectal mucosa with features of mucosal prolapse syndrome. no adenomatous changes or evidence of malignancy identified.   Orpah Cobb polypectomy  01/02/2004    Dr.Rosenbower- anal polyp removed. bx= condyloma acuminatum    Prior to Admission medications   Medication Sig Start Date End Date Taking? Authorizing Provider  Calcium Carbonate-Vitamin D (CALTRATE 600+D) 600-400 MG-UNIT per tablet  Take 1 tablet by mouth 2 (two) times daily.   Yes Historical Provider, MD  Cholecalciferol (VITAMIN D-3 PO) Take by mouth. One daily   Yes Historical Provider, MD  folic acid (FOLVITE) 993 MCG tablet Take by mouth daily.   Yes Historical Provider, MD  hydrochlorothiazide (HYDRODIURIL) 25 MG tablet one tab qam 10/09/14  Yes Mikey Kirschner, MD  Multiple Vitamins-Minerals (MULTIVITAMIN WITH MINERALS) tablet Take 1 tablet by mouth daily.   Yes Historical Provider, MD  verapamil (CALAN-SR) 180 MG CR tablet Take 1 tablet (180 mg total) by mouth at bedtime. 10/09/14  Yes Mikey Kirschner, MD  ciprofloxacin (CIPRO) 500 MG tablet Take 1 tablet (500 mg total) by mouth 2 (two) times daily. Patient not taking: Reported on 02/28/2015 01/17/15   Radene Gunning, NP  metroNIDAZOLE (FLAGYL) 500 MG tablet Take 1 tablet (500 mg total) by mouth 3 (three) times daily. Patient not taking: Reported on 02/28/2015 01/17/15   Radene Gunning, NP    Allergies as of 02/28/2015  . (No Known Allergies)    Family History  Problem Relation Age of Onset  . Hypertension Mother   . Heart disease Father   . Cancer Sister     breast    History   Social History  . Marital Status: Widowed    Spouse Name: N/A  . Number of Children: N/A  . Years of Education: N/A   Occupational History  . Not on file.   Social History Main Topics  . Smoking status: Never Smoker   . Smokeless tobacco:  Not on file  . Alcohol Use: No  . Drug Use: No  . Sexual Activity: Not on file   Other Topics Concern  . Not on file   Social History Narrative    Review of Systems: See HPI, otherwise negative ROS  Physical Exam: BP 137/92 mmHg  Pulse 97  Temp(Src) 97.6 F (36.4 C) (Oral)  Ht 5' 6"  (1.676 m)  Wt 152 lb 3.2 oz (69.037 kg)  BMI 24.58 kg/m2 General:   pleasant and cooperative in NAD. Somewhat hard of hearing. Skin:  Intact without significant lesions or rashes. Eyes:  Sclera clear, no icterus.   Conjunctiva pink. Ears:   Normal auditory acuity. Nose:  No deformity, discharge,  or lesions. Mouth:  No deformity or lesions. Neck:  Supple; no masses or thyromegaly. No significant cervical adenopathy. Lungs:  Clear throughout to auscultation.   No wheezes, crackles, or rhonchi. No acute distress. Heart:  Regular rate and rhythm; no murmurs, clicks, rubs,  or gallops. Abdomen: Non-distended, normal bowel sounds.  Soft and nontender without appreciable mass or hepatosplenomegaly.  Pulses:  Normal pulses noted. Extremities:  Without clubbing or edema. Rectal: Deferred to time of colonoscopy   Impression:  Very pleasant 74 year old lady resume his the hospital acute illness likely related to complicated diverticulitis with abscess. Incidentally, colonic mass noted in the proximal sigmoid colon. Clinically, she has recovered from complicated diverticulitis. She now needs to have a colonoscopy to further evaluate the sigmoid abnormality suggested on recent scanning.  Recommendations:   Diagnostic colonoscopy. Risk benefits, limitations and imponderables have been reviewed. Patient doesn't want to "feel anything".  Will premedicate with Phenergan12.5 mg IV to augment conscious sedation.  Split Movie Prep.  Further recommendations to follow.d she is agreeable.   Notice: This dictation was prepared with Dragon dictation along with smaller phrase technology. Any transcriptional errors that result from this process are unintentional and may not be corrected upon review.

## 2015-02-28 NOTE — Patient Instructions (Signed)
Schedule a diagnostic colonoscopy (suggestion of a colon mas on CT)  Split movie Prep  Phenergan 12.5 mg Iv prior to procedure  Further recommendations to follow

## 2015-03-11 ENCOUNTER — Other Ambulatory Visit: Payer: Self-pay | Admitting: Family Medicine

## 2015-04-02 ENCOUNTER — Encounter (HOSPITAL_COMMUNITY)
Admission: RE | Disposition: A | Discharge status: Home / Self Care | Payer: Self-pay | Source: Ambulatory Visit | Attending: Internal Medicine

## 2015-04-02 ENCOUNTER — Encounter (HOSPITAL_COMMUNITY): Payer: Self-pay | Admitting: *Deleted

## 2015-04-02 ENCOUNTER — Ambulatory Visit (HOSPITAL_COMMUNITY)
Admission: RE | Admit: 2015-04-02 | Discharge: 2015-04-02 | Disposition: A | Discharge status: Home / Self Care | Payer: Medicare Other | Source: Ambulatory Visit | Attending: Internal Medicine | Admitting: Internal Medicine

## 2015-04-02 DIAGNOSIS — N189 Chronic kidney disease, unspecified: Secondary | ICD-10-CM | POA: Insufficient documentation

## 2015-04-02 DIAGNOSIS — K633 Ulcer of intestine: Secondary | ICD-10-CM | POA: Diagnosis not present

## 2015-04-02 DIAGNOSIS — K5669 Other intestinal obstruction: Secondary | ICD-10-CM | POA: Insufficient documentation

## 2015-04-02 DIAGNOSIS — I129 Hypertensive chronic kidney disease with stage 1 through stage 4 chronic kidney disease, or unspecified chronic kidney disease: Secondary | ICD-10-CM | POA: Insufficient documentation

## 2015-04-02 DIAGNOSIS — Z79899 Other long term (current) drug therapy: Secondary | ICD-10-CM | POA: Insufficient documentation

## 2015-04-02 DIAGNOSIS — K5730 Diverticulosis of large intestine without perforation or abscess without bleeding: Secondary | ICD-10-CM | POA: Insufficient documentation

## 2015-04-02 DIAGNOSIS — R933 Abnormal findings on diagnostic imaging of other parts of digestive tract: Secondary | ICD-10-CM

## 2015-04-02 HISTORY — PX: COLONOSCOPY: SHX5424

## 2015-04-02 LAB — CBC
HEMATOCRIT: 40.4 % (ref 36.0–46.0)
HEMOGLOBIN: 12.6 g/dL (ref 12.0–15.0)
MCH: 29.2 pg (ref 26.0–34.0)
MCHC: 31.2 g/dL (ref 30.0–36.0)
MCV: 93.7 fL (ref 78.0–100.0)
PLATELETS: 241 10*3/uL (ref 150–400)
RBC: 4.31 MIL/uL (ref 3.87–5.11)
RDW: 16.7 % — AB (ref 11.5–15.5)
WBC: 6.2 10*3/uL (ref 4.0–10.5)

## 2015-04-02 SURGERY — COLONOSCOPY
Anesthesia: Moderate Sedation

## 2015-04-02 MED ORDER — PROMETHAZINE HCL 25 MG/ML IJ SOLN
12.5000 mg | Freq: Once | INTRAMUSCULAR | Status: AC
  Administered 2015-04-02: 12.5 mg via INTRAVENOUS

## 2015-04-02 MED ORDER — MEPERIDINE HCL 100 MG/ML IJ SOLN
INTRAMUSCULAR | Status: DC | PRN
  Administered 2015-04-02: 50 mg via INTRAVENOUS

## 2015-04-02 MED ORDER — SODIUM CHLORIDE 0.9 % IJ SOLN
INTRAMUSCULAR | Status: AC
  Filled 2015-04-02: qty 3

## 2015-04-02 MED ORDER — MEPERIDINE HCL 100 MG/ML IJ SOLN
INTRAMUSCULAR | Status: AC
  Filled 2015-04-02: qty 2

## 2015-04-02 MED ORDER — STERILE WATER FOR IRRIGATION IR SOLN
Status: DC | PRN
  Administered 2015-04-02: 10:00:00

## 2015-04-02 MED ORDER — MIDAZOLAM HCL 5 MG/5ML IJ SOLN
INTRAMUSCULAR | Status: DC | PRN
  Administered 2015-04-02: 1 mg via INTRAVENOUS
  Administered 2015-04-02: 2 mg via INTRAVENOUS

## 2015-04-02 MED ORDER — SODIUM CHLORIDE 0.9 % IV SOLN
INTRAVENOUS | Status: DC
  Administered 2015-04-02: 09:00:00 via INTRAVENOUS

## 2015-04-02 MED ORDER — ONDANSETRON HCL 4 MG/2ML IJ SOLN
INTRAMUSCULAR | Status: DC | PRN
  Administered 2015-04-02: 4 mg via INTRAVENOUS

## 2015-04-02 MED ORDER — ONDANSETRON HCL 4 MG/2ML IJ SOLN
INTRAMUSCULAR | Status: AC
  Filled 2015-04-02: qty 2

## 2015-04-02 MED ORDER — PROMETHAZINE HCL 25 MG/ML IJ SOLN
INTRAMUSCULAR | Status: AC
  Filled 2015-04-02: qty 1

## 2015-04-02 MED ORDER — MIDAZOLAM HCL 5 MG/5ML IJ SOLN
INTRAMUSCULAR | Status: AC
  Filled 2015-04-02: qty 10

## 2015-04-02 NOTE — Op Note (Addendum)
[place], [id number]   COLONOSCOPY PROCEDURE REPORT  PATIENT: Christine, Newman  MR#: [id number] BIRTHDATE: [birth year] , 73  yrs. old GENDER: female ENDOSCOPIST: R.  Garfield Cornea, MD FACP [facility] REFERRED NZ:[id number] Wolfgang Phoenix, M.D. PROCEDURE DATE:  April 13, 2015 PROCEDURE:   Colonoscopy with biopsy INDICATIONS:Abnormal colon on CT; recent diverticulitis?"clinically.Marland Kitchen  MEDICATIONS: Versed 2 mg IV and Demerol 50 mg IV.  Phenergan 12.5 mg IV.  Zofran 4 mg IV. ASA CLASS:       Class III  CONSENT: The risks, benefits, alternatives and imponderables including but not limited to bleeding, perforation as well as the possibility of a missed lesion have been reviewed.  The potential for biopsy, lesion removal, etc. have also been discussed. Questions have been answered.  All parties agreeable.  Please see the history and physical in the medical record for more information.  DESCRIPTION OF PROCEDURE:   After the risks benefits and alternatives of the procedure were thoroughly explained, informed consent was obtained.  The digital rectal exam revealed no abnormalities of the rectum.   The EC-[id number] ([id number])  endoscope was introduced through the anus and advanced to the cecum, which was identified by both the appendix and ileocecal valve. No adverse events experienced.   The quality of the prep was adequate  The instrument was then slowly withdrawn as the colon was fully examined. Estimated blood loss is zero unless otherwise noted in this procedure report.      COLON FINDINGS: Normal-appearing rectal mucosa.  Scattered sigmoid and descending colon diverticula.  Otherwise, the remainder of the colonic mucosa appeared normal all the way to the cecum.  The opening to the ileocecal valve was somewhat fixed and stenotic.  It would not admit the adult colonoscope after multiple attempts.  (1) 3 mm ulcer at the opening to the ileocecal valve.  I could  see several centimeters upstream into the terminal ileum.  There did appear to be an opening in the ileal mucosa suspicious for a fistula .  Please see photos.  The ulcer at the ileocecal valve was biopsied.  Retroflexion was performed. .  Withdrawal time=18 minutes 0 seconds.  The scope was withdrawn and the procedure completed. COMPLICATIONS: There were no immediate complications.  ENDOSCOPIC IMPRESSION: Colonic diverticulosis. No evidence of colonic neoplasm. Abnormal ileocecal valve with ulceration as described(stenotic ileocecal valve noted at prior colonoscopy 12 years ago). Question of fistula tract opening in the terminal ileum as described. Status post ileocecal valve biopsy.  RECOMMENDATIONS:  I have reached out to Dr. Thornton Papas to re-review the last CT scan given today's information. No evidence of colon cancer today. Abnormal ileocecal valve likely chronic finding may be nonspecific or could be smoldering inflammatory bowel disease. Question of fistula tract opening could go along with that entity or recent inflammatory process (diverticulitis)  We'll follow up on pathology. Further recommendations to follow.  It is notable this lady is now essentially devoid of any GI symptoms.  eSigned:  R. Garfield Cornea, MD [facility] 13-Apr-2015 10:22 AM   cc:  CPT CODES: ICD CODES:  The ICD and CPT codes recommended by this software are interpretations from the data that the clinical staff has captured with the software.  The verification of the translation of this report to the ICD and CPT codes and modifiers is the sole responsibility of the health care institution and practicing physician where this report was generated.  [employer] will not be held responsible for the validity  of the ICD and CPT codes included on this report.  AMA assumes no liability for data contained or not contained herein. CPT is a Designer, television/film set of the [employer].  PATIENT NAME:  Christine, Newman MR#: [id number]

## 2015-04-02 NOTE — H&P (Signed)
Primary Care Physician: Mickie Hillier, MD Primary Gastroenterologist: Dr. Gala Romney  Pre-Procedure History & Physical: HPI: Christine Newman is a 74 y.o. female here for followup recent hospitalization for probable diverticular abscess. Christine Newman was quite ill the first of last month. She has fully recovered. She feels very well. CT scan demonstrated abscess between her ileum and sigmoid colon; she was seen by general surgery. Conservative therapy was recommended. She completed a protracted course of antibiotics. CT also suggested a mass in her sigmoid colon. Last colonoscopy was back in 2004 in [place]. 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 Patient currently denies abdominal pain nausea or vomiting. She has any rectal bleeding. She feels she is doing much, much better.  Patient last  seen on May 20. Has not had any changes. continues to do very well without any GI symptoms, whatsoever.  Past Medical History  Diagnosis Date  . Hypertension   . CKD (chronic kidney disease)     Cr 1.9 -09/2014  . Colonic mass   . Diverticulitis     with abscess  . Anal polyp 2005    condyloma acuminatum    Past Surgical History  Procedure Laterality Date  . Kidney stone surgery    . Colonoscopy  04/08/2003    Dr.Mann- small nonbleeding internal and external hemorrhoids small polypoid mass at anal verge, normal appearing L colon, transverse colon, R colon including cecum. stenotic ileocecal valve. bx of maa= benign rectal type mucosa with features of mucosal prolapse syndrome  . Flexible sigmoidoscopy  11/06/2003    Dr.Mann- small nodular mass at anal verge, bx done. early L side diverticula. bx= benign rectal mucosa with features of mucosal prolapse syndrome. no adenomatous changes or evidence of malignancy identified.   Orpah Cobb polypectomy  01/02/2004    Dr.Rosenbower- anal polyp removed. bx= condyloma acuminatum     Prior to Admission medications   Medication Sig Start Date End Date Taking? Authorizing Provider  Calcium Carbonate-Vitamin D (CALTRATE 600+D) 600-400 MG-UNIT per tablet Take 1 tablet by mouth 2 (two) times daily.   Yes Historical Provider, MD  Cholecalciferol (VITAMIN D-3 PO) Take by mouth. One daily   Yes Historical Provider, MD  folic acid (FOLVITE) 366 MCG tablet Take by mouth daily.   Yes Historical Provider, MD  hydrochlorothiazide (HYDRODIURIL) 25 MG tablet one tab qam 10/09/14  Yes Mikey Kirschner, MD  Multiple Vitamins-Minerals (MULTIVITAMIN WITH MINERALS) tablet Take 1 tablet by mouth daily.   Yes Historical Provider, MD  verapamil (CALAN-SR) 180 MG CR tablet Take 1 tablet (180 mg total) by mouth at bedtime. 10/09/14  Yes Mikey Kirschner, MD  ciprofloxacin (CIPRO) 500 MG tablet Take 1 tablet (500 mg total) by mouth 2 (two) times daily. Patient not taking: Reported on 02/28/2015 01/17/15   Radene Gunning, NP  metroNIDAZOLE (FLAGYL) 500 MG tablet Take 1 tablet (500 mg total) by mouth 3 (three) times daily. Patient not taking: Reported on 02/28/2015 01/17/15   Radene Gunning, NP    Allergies as of 02/28/2015  . (No Known Allergies)    Family History  Problem Relation Age of Onset  . Hypertension Mother   . Heart disease Father   . Cancer Sister     breast    History   Social History  . Marital Status: Widowed    Spouse Name: N/A  . Number of Children: N/A  . Years of Education: N/A   Occupational History  . Not on  file.   Social History Main Topics  . Smoking status: Never Smoker   . Smokeless tobacco: Not on file  . Alcohol Use: No  . Drug Use: No  . Sexual Activity: Not on file   Other Topics Concern  . Not on file   Social History Narrative    Review of Systems: See HPI, otherwise negative ROS  Physical Exam: General:  pleasant and cooperative in NAD. Somewhat hard of hearing. Skin: Intact without significant lesions or rashes. Eyes: Sclera clear, no icterus. Conjunctiva pink. Ears: Normal auditory acuity. Nose: No deformity, discharge, or lesions. Mouth: No deformity or lesions. Neck: Supple; no masses or thyromegaly. No significant cervical adenopathy. Lungs: Clear throughout to auscultation. No wheezes, crackles, or rhonchi. No acute distress. Heart: Regular rate and rhythm; no murmurs, clicks, rubs, or gallops. Abdomen: Non-distended, normal bowel sounds. Soft and nontender without appreciable mass or hepatosplenomegaly.  Pulses: Normal pulses noted. Extremities: Without clubbing or edema. Rectal: Deferred to time of colonoscopy   Impression: Very pleasant 74 year old lady resume his the hospital acute illness likely related to complicated diverticulitis with abscess. Incidentally, colonic mass noted in the proximal sigmoid colon. Clinically, she has recovered from complicated diverticulitis. She now needs to have a colonoscopy to further evaluate the sigmoid abnormality suggested on recent scanning.  No change since being seen in the office May 20.  Recommendations: Diagnostic colonoscopy. Risk benefits, limitations and imponderables have been reviewed. Patient doesn't want to "feel anything". Will premedicate with Phenergan12.5 mg IV to augment conscious sedation.  Split Movie Prep.  Further recommendations to follow.d she is agreeable.   Notice: Thisdictation was prepared with Dragon dictation along with smaller phrase technology. Any transcriptional errors that result from this process are unintentional and may not be corrected upon review.

## 2015-04-02 NOTE — Discharge Instructions (Addendum)
Colonoscopy Discharge Instructions  Read the instructions outlined below and refer to this sheet in the next few weeks. These discharge instructions provide you with general information on caring for yourself after you leave the hospital. Your doctor may also give you specific instructions. While your treatment has been planned according to the most current medical practices available, unavoidable complications occasionally occur. If you have any problems or questions after discharge, call Dr. Gala Romney at [phone]. ACTIVITY  You may resume your regular activity, but move at a slower pace for the next 24 hours.   Take frequent rest periods for the next 24 hours.   Walking will help get rid of the air and reduce the bloated feeling in your belly (abdomen).   No driving for 24 hours (because of the medicine (anesthesia) used during the test).    Do not sign any important legal documents or operate any machinery for 24 hours (because of the anesthesia used during the test).  NUTRITION  Drink plenty of fluids.   You may resume your normal diet as instructed by your doctor.   Begin with a light meal and progress to your normal diet. Heavy or fried foods are harder to digest and may make you feel sick to your stomach (nauseated).   Avoid alcoholic beverages for 24 hours or as instructed.  MEDICATIONS  You may resume your normal medications unless your doctor tells you otherwise.  WHAT YOU CAN EXPECT TODAY  Some feelings of bloating in the abdomen.   Passage of more gas than usual.   Spotting of blood in your stool or on the toilet paper.  IF YOU HAD POLYPS REMOVED DURING THE COLONOSCOPY:  No aspirin products for 7 days or as instructed.   No alcohol for 7 days or as instructed.   Eat a soft diet for the next 24 hours.  FINDING OUT THE RESULTS OF YOUR TEST Not all test results are available during your visit. If your test results are not back during the visit, make an appointment  with your caregiver to find out the results. Do not assume everything is normal if you have not heard from your caregiver or the medical facility. It is important for you to follow up on all of your test results.  SEEK IMMEDIATE MEDICAL ATTENTION IF:  You have more than a spotting of blood in your stool.   Your belly is swollen (abdominal distention).   You are nauseated or vomiting.   You have a temperature over 101.   You have abdominal pain or discomfort that is severe or gets worse throughout the day.     Diverticulosis information provided  No evidence of colon cancer found on today's examination.  Further recommendations to follow pending review of pathology report.       Diverticulosis Diverticulosis is the condition that develops when small pouches (diverticula) form in the wall of your colon. Your colon, or large intestine, is where water is absorbed and stool is formed. The pouches form when the inside layer of your colon pushes through weak spots in the outer layers of your colon. CAUSES  No one knows exactly what causes diverticulosis. RISK FACTORS  Being older than 10. Your risk for this condition increases with age. Diverticulosis is rare in people younger than 40 years. By age 80, almost everyone has it.  Eating a low-fiber diet.  Being frequently constipated.  Being overweight.  Not getting enough exercise.  Smoking.  Taking over-the-counter pain medicines, like aspirin and  ibuprofen. SYMPTOMS  Most people with diverticulosis do not have symptoms. DIAGNOSIS  Because diverticulosis often has no symptoms, health care providers often discover the condition during an exam for other colon problems. In many cases, a health care provider will diagnose diverticulosis while using a flexible scope to examine the colon (colonoscopy). TREATMENT  If you have never developed an infection related to diverticulosis, you may not need treatment. If you have had an  infection before, treatment may include:  Eating more fruits, vegetables, and grains.  Taking a fiber supplement.  Taking a live bacteria supplement (probiotic).  Taking medicine to relax your colon. HOME CARE INSTRUCTIONS   Drink at least 6-8 glasses of water each day to prevent constipation.  Try not to strain when you have a bowel movement.  Keep all follow-up appointments. If you have had an infection before:  Increase the fiber in your diet as directed by your health care provider or dietitian.  Take a dietary fiber supplement if your health care provider approves.  Only take medicines as directed by your health care provider. SEEK MEDICAL CARE IF:   You have abdominal pain.  You have bloating.  You have cramps.  You have not gone to the bathroom in 3 days. SEEK IMMEDIATE MEDICAL CARE IF:   Your pain gets worse.  Yourbloating becomes very bad.  You have a fever or chills, and your symptoms suddenly get worse.  You begin vomiting.  You have bowel movements that are bloody or black. MAKE SURE YOU:  Understand these instructions.  Will watch your condition.  Will get help right away if you are not doing well or get worse. Document Released: 06/24/2004 Document Revised: 10/02/2013 Document Reviewed: 08/22/2013 [facility] Patient Information 2015 [place], [place]. This information is not intended to replace advice given to you by your health care provider. Make sure you discuss any questions you have with your health care provider.

## 2015-04-04 ENCOUNTER — Encounter: Payer: Self-pay | Admitting: Internal Medicine

## 2015-04-07 ENCOUNTER — Encounter (HOSPITAL_COMMUNITY): Payer: Self-pay | Admitting: Internal Medicine

## 2015-05-01 ENCOUNTER — Other Ambulatory Visit: Payer: Self-pay | Admitting: Family Medicine

## 2015-05-01 NOTE — Telephone Encounter (Signed)
Needs office visit.

## 2015-05-08 ENCOUNTER — Other Ambulatory Visit: Payer: Self-pay | Admitting: Family Medicine

## 2015-05-30 ENCOUNTER — Other Ambulatory Visit: Payer: Self-pay | Admitting: Family Medicine

## 2015-06-11 ENCOUNTER — Other Ambulatory Visit: Payer: Self-pay | Admitting: Family Medicine

## 2015-06-11 NOTE — Telephone Encounter (Signed)
30 d worth plus one ref rec f u in early oct

## 2015-07-10 ENCOUNTER — Other Ambulatory Visit: Payer: Self-pay | Admitting: Family Medicine

## 2015-07-14 ENCOUNTER — Ambulatory Visit: Payer: Medicare Other | Admitting: Gastroenterology

## 2015-07-15 ENCOUNTER — Ambulatory Visit (INDEPENDENT_AMBULATORY_CARE_PROVIDER_SITE_OTHER): Payer: Medicare Other | Admitting: Internal Medicine

## 2015-07-15 ENCOUNTER — Encounter: Payer: Self-pay | Admitting: Internal Medicine

## 2015-07-15 ENCOUNTER — Other Ambulatory Visit: Payer: Self-pay

## 2015-07-15 VITALS — BP 140/90 | HR 77 | Temp 98.2°F | Ht 66.0 in | Wt 151.2 lb

## 2015-07-15 DIAGNOSIS — K5732 Diverticulitis of large intestine without perforation or abscess without bleeding: Secondary | ICD-10-CM

## 2015-07-15 DIAGNOSIS — R109 Unspecified abdominal pain: Secondary | ICD-10-CM

## 2015-07-15 DIAGNOSIS — K5780 Diverticulitis of intestine, part unspecified, with perforation and abscess without bleeding: Secondary | ICD-10-CM

## 2015-07-15 DIAGNOSIS — Z8719 Personal history of other diseases of the digestive system: Secondary | ICD-10-CM

## 2015-07-15 NOTE — Patient Instructions (Signed)
Abdominal and pelvic CT w/wo contrast to further evaluate recurrent abdominal pain; history of complicated diverticulitis  Further recommendations to follow

## 2015-07-15 NOTE — Progress Notes (Signed)
Primary Care Physician:  Mickie Hillier, MD Primary Gastroenterologist:  Dr. Gala Romney  Pre-Procedure History & Physical: HPI:  Christine Newman is a 74 y.o. female here for evaluation of a one week history of abdominal pain. Patient describes crampy upper abdominal pain, worse with meals over the past one week.  No nausea or vomiting. Postprandial pain. No diarrhea, constipation, melena, rectal bleeding. No fever or chills.   Recent GI history significant for hospitalization for complicated diverticulitis with abscess and protracted course of antibodies. Follow-up colonoscopy demonstrated a possible fistula opening in the terminal ileum. Some ulceration at this level. Biopsies nonspecific. No tumor found.  Past Medical History  Diagnosis Date  . Hypertension   . CKD (chronic kidney disease)     Cr 1.9 -09/2014  . Colonic mass   . Diverticulitis     with abscess  . Anal polyp 2005    condyloma acuminatum    Past Surgical History  Procedure Laterality Date  . Kidney stone surgery    . Colonoscopy  04/08/2003    Dr.Mann- small nonbleeding internal and external hemorrhoids small polypoid mass at anal verge, normal appearing L colon, transverse colon, R colon including cecum. stenotic ileocecal valve. bx of maa= benign rectal type mucosa with features of mucosal prolapse syndrome  . Flexible sigmoidoscopy  11/06/2003    Dr.Mann- small nodular mass at anal verge, bx done. early L side diverticula. bx= benign rectal mucosa with features of mucosal prolapse syndrome. no adenomatous changes or evidence of malignancy identified.   Orpah Cobb polypectomy  01/02/2004    Dr.Rosenbower- anal polyp removed. bx= condyloma acuminatum  . Colonoscopy N/A 04/02/2015    Dr.Idelia Caudell- colonic diverticulosis, no evidence of colonic neoplasm. abnormal ileocecal valve with ulceration bx= ulcerated/eroded benign ileocecal valve mucosa    Prior to Admission medications   Medication Sig Start Date End Date Taking?  Authorizing Provider  Calcium Carbonate-Vitamin D (CALTRATE 600+D) 600-400 MG-UNIT per tablet Take 1 tablet by mouth 2 (two) times daily.   Yes Historical Provider, MD  Cholecalciferol (VITAMIN D-3 PO) Take by mouth. One daily   Yes Historical Provider, MD  folic acid (FOLVITE) 017 MCG tablet Take by mouth daily.   Yes Historical Provider, MD  hydrochlorothiazide (HYDRODIURIL) 25 MG tablet TAKE (1) TABLET BY MOUTH EACH MORNING. 07/10/15  Yes Kathyrn Drown, MD  Multiple Vitamins-Minerals (MULTIVITAMIN WITH MINERALS) tablet Take 1 tablet by mouth daily.   Yes Historical Provider, MD  verapamil (CALAN-SR) 180 MG CR tablet TAKE (1) TABLET BY MOUTH AT BEDTIME. 07/10/15  Yes Kathyrn Drown, MD  ciprofloxacin (CIPRO) 500 MG tablet Take 1 tablet (500 mg total) by mouth 2 (two) times daily. Patient not taking: Reported on 02/28/2015 01/17/15   Radene Gunning, NP  metroNIDAZOLE (FLAGYL) 500 MG tablet Take 1 tablet (500 mg total) by mouth 3 (three) times daily. Patient not taking: Reported on 02/28/2015 01/17/15   Radene Gunning, NP    Allergies as of 07/15/2015  . (No Known Allergies)    Family History  Problem Relation Age of Onset  . Hypertension Mother   . Heart disease Father   . Cancer Sister     breast  . Colon cancer Neg Hx     Social History   Social History  . Marital Status: Widowed    Spouse Name: N/A  . Number of Children: N/A  . Years of Education: N/A   Occupational History  . Not on file.   Social History  Main Topics  . Smoking status: Former Smoker -- 5 years    Types: Cigarettes  . Smokeless tobacco: Not on file     Comment: 1 pack cigarettes per week   . Alcohol Use: No  . Drug Use: No  . Sexual Activity: Not on file   Other Topics Concern  . Not on file   Social History Narrative    Review of Systems: See HPI, otherwise negative ROS  Physical Exam: BP 140/90 mmHg  Pulse 77  Temp(Src) 98.2 F (36.8 C) (Oral)  Ht 5' 6"  (1.676 m)  Wt 151 lb 3.2 oz (68.584 kg)   BMI 24.42 kg/m2 General:   Alert,  Well-developed, well-nourished, pleasant and cooperative in NAD Skin:  Intact without significant lesions or rashes. Neck:  Supple; no masses or thyromegaly. No significant cervical adenopathy. Lungs:  Clear throughout to auscultation.   No wheezes, crackles, or rhonchi. No acute distress. Heart:  Regular rate and rhythm; no murmurs, clicks, rubs,  or gallops. Abdomen: Non-distended, normal bowel sounds.  Soft and nontender without appreciable mass or hepatosplenomegaly.  Pulses:  Normal pulses noted. Extremities:  Without clubbing or edema.  Impression:  74 year old lady with recurrent abdominal pain and the setting of a recent history of complicated diverticulitis with abscess possible fistula formation. Ulceration ileocecal valve noted and felt to be nonspecific.  She was doing well until this past week.  Abdominal pain and that is unusual for this lady. At this time, she sterilely does not look acutely ill or toxic appearing. However, with recent history, further evaluation warranted.  Recommendations:   Abdominal and pelvic CT w/wo contrast to further evaluate recurrent abdominal pain; history of complicated diverticulitis  Further recommendations to follow    Notice: This dictation was prepared with Dragon dictation along with smaller phrase technology. Any transcriptional errors that result from this process are unintentional and may not be corrected upon review.

## 2015-07-21 ENCOUNTER — Ambulatory Visit (INDEPENDENT_AMBULATORY_CARE_PROVIDER_SITE_OTHER): Payer: Medicare Other | Admitting: Family Medicine

## 2015-07-21 ENCOUNTER — Encounter: Payer: Self-pay | Admitting: Family Medicine

## 2015-07-21 VITALS — BP 124/76 | Ht 66.0 in | Wt 153.2 lb

## 2015-07-21 DIAGNOSIS — S161XXA Strain of muscle, fascia and tendon at neck level, initial encounter: Secondary | ICD-10-CM | POA: Diagnosis not present

## 2015-07-21 MED ORDER — PREDNISONE 10 MG PO TABS: ORAL_TABLET | ORAL | Status: DC

## 2015-07-21 MED ORDER — CHLORZOXAZONE 500 MG PO TABS: ORAL_TABLET | ORAL | Status: DC

## 2015-07-21 MED ORDER — HYDROCODONE-ACETAMINOPHEN 7.5-325 MG PO TABS: ORAL_TABLET | ORAL | Status: DC

## 2015-07-21 NOTE — Progress Notes (Signed)
   Subjective:    Patient ID: Christine Newman, female    DOB: [birth year], 74 y.o.   MRN: [id number]  Neck Pain  This is a new problem. The current episode started in the past 7 days. The problem occurs 2 to 4 times per day. The pain is associated with lifting a heavy object. The pain is present in the left side. The quality of the pain is described as stabbing. The symptoms are aggravated by position and twisting. She has tried acetaminophen, heat and ice for the symptoms.   Bad pain the next morn after lifting furniture  Using heating pad off and on, and added topical agents icy hot etc.  Pain rad to head, and hurt bad   Patient states no other concerns this visit.   Review of Systems  Musculoskeletal: Positive for neck pain.   no vomiting no diarrhea no frontal headache positive left posterior headache     Objective:   Physical Exam Alert moderate malaise. Guarding head and neck. Positive supraclavicular and impaired cervical tenderness left side. Head range of motion limited lungs clear heart regular in rhythm.       Assessment & Plan:  Impression paracervical strain/spasm plan local measures discussed sterilely taper. Chlorzoxazone when necessary. Hydrocodone when necessary with WSL

## 2015-07-24 ENCOUNTER — Other Ambulatory Visit: Payer: Self-pay | Admitting: Internal Medicine

## 2015-07-24 ENCOUNTER — Ambulatory Visit (HOSPITAL_COMMUNITY)
Admission: RE | Admit: 2015-07-24 | Discharge: 2015-07-24 | Disposition: A | Discharge status: Home / Self Care | Payer: Medicare Other | Source: Ambulatory Visit | Attending: Internal Medicine | Admitting: Internal Medicine

## 2015-07-24 DIAGNOSIS — K651 Peritoneal abscess: Secondary | ICD-10-CM | POA: Diagnosis not present

## 2015-07-24 DIAGNOSIS — N200 Calculus of kidney: Secondary | ICD-10-CM | POA: Insufficient documentation

## 2015-07-24 DIAGNOSIS — Z8719 Personal history of other diseases of the digestive system: Secondary | ICD-10-CM

## 2015-07-24 DIAGNOSIS — K5730 Diverticulosis of large intestine without perforation or abscess without bleeding: Secondary | ICD-10-CM | POA: Insufficient documentation

## 2015-07-24 DIAGNOSIS — R109 Unspecified abdominal pain: Secondary | ICD-10-CM | POA: Insufficient documentation

## 2015-07-24 LAB — POCT I-STAT CREATININE: Creatinine, Ser: 1.8 mg/dL — ABNORMAL HIGH (ref 0.44–1.00)

## 2015-07-24 NOTE — Progress Notes (Signed)
CRITICAL REPORT VERBAL TO DR Gala Romney AT 10.13.2016 @ 3343 CREATININE 1.8 10.13.2016

## 2015-08-04 ENCOUNTER — Ambulatory Visit: Payer: Self-pay | Admitting: Surgery

## 2015-08-04 ENCOUNTER — Ambulatory Visit: Payer: Self-pay | Admitting: Gastroenterology

## 2015-08-06 ENCOUNTER — Ambulatory Visit: Payer: Medicare Other | Admitting: Gastroenterology

## 2015-09-05 ENCOUNTER — Other Ambulatory Visit: Payer: Self-pay | Admitting: Family Medicine

## 2015-09-23 ENCOUNTER — Ambulatory Visit (INDEPENDENT_AMBULATORY_CARE_PROVIDER_SITE_OTHER): Payer: Medicare Other | Admitting: Family Medicine

## 2015-09-23 ENCOUNTER — Encounter: Payer: Self-pay | Admitting: Family Medicine

## 2015-09-23 VITALS — BP 136/88 | Ht 66.0 in | Wt 141.2 lb

## 2015-09-23 DIAGNOSIS — N289 Disorder of kidney and ureter, unspecified: Secondary | ICD-10-CM | POA: Diagnosis not present

## 2015-09-23 DIAGNOSIS — F411 Generalized anxiety disorder: Secondary | ICD-10-CM

## 2015-09-23 DIAGNOSIS — I10 Essential (primary) hypertension: Secondary | ICD-10-CM | POA: Diagnosis not present

## 2015-09-23 DIAGNOSIS — N183 Chronic kidney disease, stage 3 unspecified: Secondary | ICD-10-CM

## 2015-09-23 NOTE — Progress Notes (Signed)
   Subjective:    Patient ID: Christine Newman, female    DOB: 11/18/1940, 74 y.o.   MRN: [id number]  Hypertension This is a chronic problem. The current episode started more than 1 year ago. Risk factors for coronary artery disease include post-menopausal state. Treatments tried: verapamil, hctz. There are no compliance problems.    Following up with car surg cntr for abd discomfort,advised not needing surg nw, had history of diverticulitis. States overall abdominal discomfort is improved.  Wanted to f u  Sticking with b p meds faithfully  Saw kid dr,  wtche s b w closely  Flu shot this fall, Now drinking ensure regullylar patient states compliant with blood pressure medicines. Meds are reviewed today.  Patient also worried about her renal status. Recent lab work reviewed. Also followed by nephrologist. Next  Patient also worried about anxiety. Reports frequent bouts of anxiety. Often finds himself anxious about all kinds of pains. No frank depression. Exercise not the best,     Review of Systems No high fever or chills no dysuria no current abdominal pain no headache ROS otherwise negative    Objective:   Physical Exam  Alert anxious appearing. Vitals stable. Blood pressure good on repeat HEENT normal. Lungs clear. Heart rare rhythm. Abdominal exam benign.      Assessment & Plan:  Impression 1 hypertension good control discussed meds reviewed #2 renal insufficiency stable labs reviewed #3 status post diverticulitis clinically stable at this time multiple questions answered or 4 chronic anxiety more morning issue at this patient. Plan medications refilled. Diet exercise discussed. Has specialist and all reports this. 25 minutes spent noticed most in discussion. The patient will like to consider medicine for anxiety encourage rescheduling a visit otherwise follow-up in 6 months WSL

## 2015-09-25 ENCOUNTER — Other Ambulatory Visit: Payer: Self-pay | Admitting: Family Medicine

## 2015-09-27 DIAGNOSIS — F411 Generalized anxiety disorder: Secondary | ICD-10-CM | POA: Insufficient documentation

## 2015-10-15 ENCOUNTER — Encounter (HOSPITAL_COMMUNITY)
Admission: RE | Admit: 2015-10-15 | Discharge: 2015-10-15 | Disposition: A | Discharge status: Home / Self Care | Payer: Medicare Other | Source: Ambulatory Visit | Attending: Nephrology | Admitting: Nephrology

## 2015-10-15 DIAGNOSIS — D509 Iron deficiency anemia, unspecified: Secondary | ICD-10-CM | POA: Insufficient documentation

## 2015-10-15 DIAGNOSIS — R5383 Other fatigue: Secondary | ICD-10-CM | POA: Insufficient documentation

## 2015-10-15 MED ORDER — SODIUM CHLORIDE 0.9 % IV SOLN
510.0000 mg | Freq: Once | INTRAVENOUS | Status: AC
  Administered 2015-10-15: 510 mg via INTRAVENOUS
  Filled 2015-10-15: qty 17

## 2015-10-15 MED ORDER — SODIUM CHLORIDE 0.9 % IV SOLN
Freq: Once | INTRAVENOUS | Status: AC
  Administered 2015-10-15: 200 mL via INTRAVENOUS

## 2015-10-22 ENCOUNTER — Encounter (HOSPITAL_COMMUNITY)
Admission: RE | Admit: 2015-10-22 | Discharge: 2015-10-22 | Disposition: A | Discharge status: Home / Self Care | Payer: Medicare Other | Source: Ambulatory Visit | Attending: Nephrology | Admitting: Nephrology

## 2015-10-22 DIAGNOSIS — D509 Iron deficiency anemia, unspecified: Secondary | ICD-10-CM | POA: Diagnosis not present

## 2015-10-22 MED ORDER — SODIUM CHLORIDE 0.9 % IV SOLN
510.0000 mg | INTRAVENOUS | Status: DC
  Administered 2015-10-22: 510 mg via INTRAVENOUS
  Filled 2015-10-22: qty 17

## 2015-10-22 MED ORDER — SODIUM CHLORIDE 0.9 % IV SOLN
INTRAVENOUS | Status: DC
  Administered 2015-10-22: 250 mL via INTRAVENOUS

## 2016-02-23 ENCOUNTER — Other Ambulatory Visit: Payer: Self-pay | Admitting: Family Medicine

## 2016-03-24 ENCOUNTER — Encounter: Payer: Self-pay | Admitting: Family Medicine

## 2016-03-24 ENCOUNTER — Ambulatory Visit (INDEPENDENT_AMBULATORY_CARE_PROVIDER_SITE_OTHER): Payer: Medicare Other | Admitting: Family Medicine

## 2016-03-24 ENCOUNTER — Ambulatory Visit (HOSPITAL_COMMUNITY)
Admission: RE | Admit: 2016-03-24 | Discharge: 2016-03-24 | Disposition: A | Discharge status: Home / Self Care | Payer: Medicare Other | Source: Ambulatory Visit | Attending: Family Medicine | Admitting: Family Medicine

## 2016-03-24 VITALS — BP 128/80 | Ht 66.0 in | Wt 149.4 lb

## 2016-03-24 DIAGNOSIS — M542 Cervicalgia: Secondary | ICD-10-CM | POA: Diagnosis not present

## 2016-03-24 DIAGNOSIS — N183 Chronic kidney disease, stage 3 unspecified: Secondary | ICD-10-CM

## 2016-03-24 DIAGNOSIS — I10 Essential (primary) hypertension: Secondary | ICD-10-CM

## 2016-03-24 DIAGNOSIS — F411 Generalized anxiety disorder: Secondary | ICD-10-CM

## 2016-03-24 MED ORDER — VERAPAMIL HCL ER 180 MG PO TBCR: EXTENDED_RELEASE_TABLET | ORAL | Status: DC

## 2016-03-24 MED ORDER — HYDROCHLOROTHIAZIDE 25 MG PO TABS: ORAL_TABLET | ORAL | Status: DC

## 2016-03-24 NOTE — Progress Notes (Signed)
   Subjective:    Patient ID: Christine Newman, female    DOB: 08-Apr-1941, 75 y.o.   MRN: [id number]  Hypertension This is a chronic problem. The current episode started more than 1 year ago. The problem has been gradually improving since onset. There are no associated agents to hypertension. There are no known risk factors for coronary artery disease. Treatments tried: verapamil, hctz. The current treatment provides moderate improvement. There are no compliance problems.    Patient states that she has neck pain. Onset several months ago. Left posterior neck pain. Worse with certain motion. Worried that it might be some series. No nighttime symptoms. Often goes days without extrinsic. This occurs with certain motions. Tries occasional over-the-counter Tylenol no radiation into left arm Treatments tried: none.   Anxious at times, occas trouble sleeping. Realizes it's an issue for her but wishes not to take any prescription medication this time.  Hx of cervical strain last yr,,  Review of Systems No headache, no major weight loss or weight gain, no chest pain no back pain abdominal pain no change in bowel habits complete ROS otherwise negative     Objective:   Physical Exam Alert vital stable blood pressure good on repeat HEENT left posterior lateral neck tender to palpation arm strength sensation good slightly anxious appearing       Assessment & Plan:  Impression 1 hypertension good control meds reviewed to discuss to continue same #2 chronic left cervical strain. Highly doubt anything serious no neuropathic features discussed #3 renal insufficiency discussed #4 chronic anxiety plan x-ray cervical spine. Tylenol when necessary. Local measures discussed. Blood pressure meds refilled. Patient wishes to hold off on anxiety medicines recheck every 6 months WSL

## 2016-04-08 ENCOUNTER — Ambulatory Visit (INDEPENDENT_AMBULATORY_CARE_PROVIDER_SITE_OTHER): Payer: Medicare Other | Admitting: Otolaryngology

## 2016-04-08 DIAGNOSIS — H903 Sensorineural hearing loss, bilateral: Secondary | ICD-10-CM | POA: Diagnosis not present

## 2016-04-08 DIAGNOSIS — H9313 Tinnitus, bilateral: Secondary | ICD-10-CM

## 2016-09-21 ENCOUNTER — Ambulatory Visit (INDEPENDENT_AMBULATORY_CARE_PROVIDER_SITE_OTHER): Payer: Medicare Other | Admitting: Family Medicine

## 2016-09-21 ENCOUNTER — Encounter: Payer: Self-pay | Admitting: Family Medicine

## 2016-09-21 VITALS — BP 130/82 | Ht 66.0 in | Wt 151.8 lb

## 2016-09-21 DIAGNOSIS — I10 Essential (primary) hypertension: Secondary | ICD-10-CM

## 2016-09-21 DIAGNOSIS — N183 Chronic kidney disease, stage 3 unspecified: Secondary | ICD-10-CM

## 2016-09-21 MED ORDER — HYDROCHLOROTHIAZIDE 25 MG PO TABS: ORAL_TABLET | ORAL | 5 refills | Status: DC

## 2016-09-21 MED ORDER — VERAPAMIL HCL ER 180 MG PO TBCR: EXTENDED_RELEASE_TABLET | ORAL | 5 refills | Status: DC

## 2016-09-21 NOTE — Progress Notes (Signed)
   Subjective:    Patient ID: Christine Newman, female    DOB: [birth year], 75 y.o.   MRN: [id number]  Hypertension  This is a chronic problem. The current episode started more than 1 year ago. Risk factors for coronary artery disease include post-menopausal state. Improvement on treatment: verapamil, hctz. There are no compliance problems.    Blood pressure medicine and blood pressure levels reviewed today with patient. Compliant with blood pressure medicine. States does not miss a dose. No obvious side effects. Blood pressure generally good when checked elsewhere. Watching salt intake.  Numbers usually 120 to 130 syst  dias high 70s to low 80s  Not exercising as much now doue to broke toe and now trying to stay active   Patient notes ongoing challenges with anxiety at times no frank depression  Review of Systems No headache, no major weight loss or weight gain, no chest pain no back pain abdominal pain no change in bowel habits complete ROS otherwise negative     Objective:   Physical Exam  Alert vitals stable, NAD. Blood pressure good on repeat. HEENT normal. Lungs clear. Heart regular rate and rhythm.       Assessment & Plan:  Impression hypertension discussed continue same meds diet exercise discussed #2 chronic renal insufficiency followed by nephrologist. #3 chronic anxiety patient wishes no medicines at this time plan medications refilled diet exercise discussed recheck in 6 months

## 2016-09-23 ENCOUNTER — Ambulatory Visit: Payer: Medicare Other | Admitting: Family Medicine

## 2017-02-22 ENCOUNTER — Telehealth: Payer: Self-pay | Admitting: Family Medicine

## 2017-02-22 DIAGNOSIS — Z79899 Other long term (current) drug therapy: Secondary | ICD-10-CM

## 2017-02-22 DIAGNOSIS — I10 Essential (primary) hypertension: Secondary | ICD-10-CM

## 2017-02-22 DIAGNOSIS — Z13220 Encounter for screening for lipoid disorders: Secondary | ICD-10-CM

## 2017-02-22 NOTE — Telephone Encounter (Signed)
Tried to call no answer (labs ordered 02/22/17)

## 2017-02-22 NOTE — Telephone Encounter (Signed)
Lip liv m7 

## 2017-02-22 NOTE — Telephone Encounter (Signed)
Pt is requesting lab orders to be sent over for an upcoming appt. Last labs per epic were: bmp on 01/17/15

## 2017-02-24 NOTE — Telephone Encounter (Signed)
Pt.notified

## 2017-03-02 LAB — LIPID PANEL
CHOL/HDL RATIO: 3.2 ratio (ref 0.0–4.4)
CHOLESTEROL TOTAL: 189 mg/dL (ref 100–199)
HDL: 60 mg/dL (ref >39)
LDL CALC: 113 mg/dL — AB (ref 0–99)
TRIGLYCERIDES: 82 mg/dL (ref 0–149)
VLDL CHOLESTEROL CAL: 16 mg/dL (ref 5–40)

## 2017-03-02 LAB — BASIC METABOLIC PANEL
BUN / CREAT RATIO: 11 — AB (ref 12–28)
BUN: 19 mg/dL (ref 8–27)
CHLORIDE: 103 mmol/L (ref 96–106)
CO2: 25 mmol/L (ref 18–29)
CREATININE: 1.69 mg/dL — AB (ref 0.57–1.00)
Calcium: 9.8 mg/dL (ref 8.7–10.3)
GFR calc Af Amer: 34 mL/min/{1.73_m2} — ABNORMAL LOW (ref >59)
GFR calc non Af Amer: 29 mL/min/{1.73_m2} — ABNORMAL LOW (ref >59)
GLUCOSE: 89 mg/dL (ref 65–99)
Potassium: 4.1 mmol/L (ref 3.5–5.2)
SODIUM: 142 mmol/L (ref 134–144)

## 2017-03-02 LAB — HEPATIC FUNCTION PANEL
ALT: 13 IU/L (ref 0–32)
AST: 21 IU/L (ref 0–40)
Albumin: 3.9 g/dL (ref 3.5–4.8)
Alkaline Phosphatase: 79 IU/L (ref 39–117)
BILIRUBIN, DIRECT: 0.12 mg/dL (ref 0.00–0.40)
Bilirubin Total: 0.4 mg/dL (ref 0.0–1.2)
TOTAL PROTEIN: 6 g/dL (ref 6.0–8.5)

## 2017-03-21 ENCOUNTER — Ambulatory Visit (INDEPENDENT_AMBULATORY_CARE_PROVIDER_SITE_OTHER): Payer: Medicare Other | Admitting: Family Medicine

## 2017-03-21 ENCOUNTER — Encounter: Payer: Self-pay | Admitting: Family Medicine

## 2017-03-21 VITALS — BP 136/84 | Temp 99.3°F | Ht 66.0 in | Wt 153.0 lb

## 2017-03-21 DIAGNOSIS — N183 Chronic kidney disease, stage 3 unspecified: Secondary | ICD-10-CM

## 2017-03-21 DIAGNOSIS — Z87440 Personal history of urinary (tract) infections: Secondary | ICD-10-CM | POA: Diagnosis not present

## 2017-03-21 DIAGNOSIS — I10 Essential (primary) hypertension: Secondary | ICD-10-CM

## 2017-03-21 DIAGNOSIS — F411 Generalized anxiety disorder: Secondary | ICD-10-CM | POA: Diagnosis not present

## 2017-03-21 MED ORDER — HYDROCHLOROTHIAZIDE 25 MG PO TABS: ORAL_TABLET | ORAL | 5 refills | Status: DC

## 2017-03-21 MED ORDER — VERAPAMIL HCL ER 180 MG PO TBCR: EXTENDED_RELEASE_TABLET | ORAL | 5 refills | Status: DC

## 2017-03-21 NOTE — Progress Notes (Signed)
Subjective:    Patient ID: Christine Newman, female    DOB: January 22, 1941, 76 y.o.   MRN: [id number] Patient arrives with numerous concerns Hypertension  This is a chronic problem. Risk factors for coronary artery disease include post-menopausal state. Treatments tried: verapamil, hctz. There are no compliance problems (takes meds every day, eats healthy, exercises.).     Was told with visit with urologist nephrologist 2 weeks ago that she had a urinary tract infection. Had no symptoms whatsoever. Was given antibiotics. Took all of these. Still experiencing no urinary symptoms. Arrives today with a very slight elevation of 10. Was unaware of this. No concerns today but had a low grade fever. No urninary symptoms   Blood pressure medicine and blood pressure levels reviewed today with patient. Compliant with blood pressure medicine. States does not miss a dose. No obvious side effects. Blood pressure generally good when checked elsewhere. Watching salt intake.  Recently rxed uti at the nephr ofice  Results for orders placed or performed in visit on 02/22/17  Lipid panel  Result Value Ref Range   Cholesterol, Total 189 100 - 199 mg/dL   Triglycerides 82 0 - 149 mg/dL   HDL 60 >39 mg/dL   VLDL Cholesterol Cal 16 5 - 40 mg/dL   LDL Calculated 113 (H) 0 - 99 mg/dL   Chol/HDL Ratio 3.2 0.0 - 4.4 ratio  Hepatic function panel  Result Value Ref Range   Total Protein 6.0 6.0 - 8.5 g/dL   Albumin 3.9 3.5 - 4.8 g/dL   Bilirubin Total 0.4 0.0 - 1.2 mg/dL   Bilirubin, Direct 0.12 0.00 - 0.40 mg/dL   Alkaline Phosphatase 79 39 - 117 IU/L   AST 21 0 - 40 IU/L   ALT 13 0 - 32 IU/L  Basic metabolic panel  Result Value Ref Range   Glucose 89 65 - 99 mg/dL   BUN 19 8 - 27 mg/dL   Creatinine, Ser 1.69 (H) 0.57 - 1.00 mg/dL   GFR calc non Af Amer 29 (L) >59 mL/min/1.73   GFR calc Af Amer 34 (L) >59 mL/min/1.73   BUN/Creatinine Ratio 11 (L) 12 - 28   Sodium 142 134 - 144 mmol/L   Potassium 4.1  3.5 - 5.2 mmol/L   Chloride 103 96 - 106 mmol/L   CO2 25 18 - 29 mmol/L   Calcium 9.8 8.7 - 10.3 mg/dL   Patient has known chronic kidney disease. See his renal specialists for this.  Also notes chronic anxiety. No major challenges with this currently. On no specific medication   Review of Systems No headache, no major weight loss or weight gain, no chest pain no back pain abdominal pain no change in bowel habits complete ROS otherwise negative     Objective:   Physical Exam Alert and oriented, vitals reviewed and stable, NAD ENT-TM's and ext canals WNL bilat via otoscopic exam Soft palate, tonsils and post pharynx WNL via oropharyngeal exam Neck-symmetric, no masses; thyroid nonpalpable and nontender Pulmonary-no tachypnea or accessory muscle use; Clear without wheezes via auscultation Card--no abnrml murmurs, rhythm reg and rate WNL Carotid pulses symmetric, without bruits        Assessment & Plan:  Impression 1 hypertension discussed good control maintain same #2 chronic kidney disease discussed maintain same stage III number stable for now thankfully #3 chronic anxiety ongoing and stable #4 potential urinary tract infection, since 0 symptoms in the experts he states recommend nose screening urinalyses, we'll hold off on  further testing rationale discussed after numerous questions

## 2017-03-21 NOTE — Patient Instructions (Signed)
Results for orders placed or performed in visit on 02/22/17  Lipid panel  Result Value Ref Range   Cholesterol, Total 189 100 - 199 mg/dL   Triglycerides 82 0 - 149 mg/dL   HDL 60 >39 mg/dL   VLDL Cholesterol Cal 16 5 - 40 mg/dL   LDL Calculated 113 (H) 0 - 99 mg/dL   Chol/HDL Ratio 3.2 0.0 - 4.4 ratio  Hepatic function panel  Result Value Ref Range   Total Protein 6.0 6.0 - 8.5 g/dL   Albumin 3.9 3.5 - 4.8 g/dL   Bilirubin Total 0.4 0.0 - 1.2 mg/dL   Bilirubin, Direct 0.12 0.00 - 0.40 mg/dL   Alkaline Phosphatase 79 39 - 117 IU/L   AST 21 0 - 40 IU/L   ALT 13 0 - 32 IU/L  Basic metabolic panel  Result Value Ref Range   Glucose 89 65 - 99 mg/dL   BUN 19 8 - 27 mg/dL   Creatinine, Ser 1.69 (H) 0.57 - 1.00 mg/dL   GFR calc non Af Amer 29 (L) >59 mL/min/1.73   GFR calc Af Amer 34 (L) >59 mL/min/1.73   BUN/Creatinine Ratio 11 (L) 12 - 28   Sodium 142 134 - 144 mmol/L   Potassium 4.1 3.5 - 5.2 mmol/L   Chloride 103 96 - 106 mmol/L   CO2 25 18 - 29 mmol/L   Calcium 9.8 8.7 - 10.3 mg/dL        ++

## 2017-03-28 ENCOUNTER — Encounter: Payer: Self-pay | Admitting: Obstetrics & Gynecology

## 2017-03-31 ENCOUNTER — Ambulatory Visit (INDEPENDENT_AMBULATORY_CARE_PROVIDER_SITE_OTHER): Payer: Medicare Other | Admitting: Otolaryngology

## 2017-03-31 DIAGNOSIS — H903 Sensorineural hearing loss, bilateral: Secondary | ICD-10-CM

## 2017-06-30 ENCOUNTER — Ambulatory Visit (INDEPENDENT_AMBULATORY_CARE_PROVIDER_SITE_OTHER): Payer: Medicare Other | Admitting: Family Medicine

## 2017-06-30 ENCOUNTER — Encounter (HOSPITAL_COMMUNITY): Payer: Self-pay | Admitting: *Deleted

## 2017-06-30 ENCOUNTER — Emergency Department (HOSPITAL_COMMUNITY): Payer: Medicare Other

## 2017-06-30 ENCOUNTER — Encounter: Payer: Self-pay | Admitting: Family Medicine

## 2017-06-30 ENCOUNTER — Emergency Department (HOSPITAL_COMMUNITY)
Admission: EM | Admit: 2017-06-30 | Discharge: 2017-06-30 | Disposition: A | Discharge status: Home / Self Care | Payer: Medicare Other | Attending: Emergency Medicine | Admitting: Emergency Medicine

## 2017-06-30 VITALS — BP 122/80 | Temp 98.3°F | Ht 66.0 in | Wt 152.6 lb

## 2017-06-30 DIAGNOSIS — R1030 Lower abdominal pain, unspecified: Secondary | ICD-10-CM

## 2017-06-30 DIAGNOSIS — K529 Noninfective gastroenteritis and colitis, unspecified: Secondary | ICD-10-CM | POA: Diagnosis not present

## 2017-06-30 DIAGNOSIS — Z8719 Personal history of other diseases of the digestive system: Secondary | ICD-10-CM

## 2017-06-30 DIAGNOSIS — N183 Chronic kidney disease, stage 3 (moderate): Secondary | ICD-10-CM | POA: Diagnosis not present

## 2017-06-30 DIAGNOSIS — Z87891 Personal history of nicotine dependence: Secondary | ICD-10-CM | POA: Insufficient documentation

## 2017-06-30 DIAGNOSIS — I129 Hypertensive chronic kidney disease with stage 1 through stage 4 chronic kidney disease, or unspecified chronic kidney disease: Secondary | ICD-10-CM | POA: Diagnosis not present

## 2017-06-30 DIAGNOSIS — R1031 Right lower quadrant pain: Secondary | ICD-10-CM | POA: Diagnosis not present

## 2017-06-30 DIAGNOSIS — N3000 Acute cystitis without hematuria: Secondary | ICD-10-CM | POA: Diagnosis not present

## 2017-06-30 LAB — COMPREHENSIVE METABOLIC PANEL
ALBUMIN: 3.8 g/dL (ref 3.5–5.0)
ALT: 11 U/L — AB (ref 14–54)
AST: 23 U/L (ref 15–41)
Alkaline Phosphatase: 74 U/L (ref 38–126)
Anion gap: 12 (ref 5–15)
BUN: 17 mg/dL (ref 6–20)
CHLORIDE: 99 mmol/L — AB (ref 101–111)
CO2: 29 mmol/L (ref 22–32)
CREATININE: 1.52 mg/dL — AB (ref 0.44–1.00)
Calcium: 9.9 mg/dL (ref 8.9–10.3)
GFR calc Af Amer: 38 mL/min — ABNORMAL LOW (ref >60)
GFR, EST NON AFRICAN AMERICAN: 32 mL/min — AB (ref >60)
GLUCOSE: 97 mg/dL (ref 65–99)
Potassium: 3.5 mmol/L (ref 3.5–5.1)
Sodium: 140 mmol/L (ref 135–145)
Total Bilirubin: 0.8 mg/dL (ref 0.3–1.2)
Total Protein: 7.3 g/dL (ref 6.5–8.1)

## 2017-06-30 LAB — CBC WITH DIFFERENTIAL/PLATELET
Basophils Absolute: 0 10*3/uL (ref 0.0–0.1)
Basophils Relative: 0 %
EOS PCT: 2 %
Eosinophils Absolute: 0.2 10*3/uL (ref 0.0–0.7)
HCT: 42.7 % (ref 36.0–46.0)
HEMOGLOBIN: 13.6 g/dL (ref 12.0–15.0)
LYMPHS ABS: 1.7 10*3/uL (ref 0.7–4.0)
LYMPHS PCT: 17 %
MCH: 29.5 pg (ref 26.0–34.0)
MCHC: 31.9 g/dL (ref 30.0–36.0)
MCV: 92.6 fL (ref 78.0–100.0)
MONOS PCT: 5 %
Monocytes Absolute: 0.5 10*3/uL (ref 0.1–1.0)
Neutro Abs: 7.5 10*3/uL (ref 1.7–7.7)
Neutrophils Relative %: 76 %
Platelets: 281 10*3/uL (ref 150–400)
RBC: 4.61 MIL/uL (ref 3.87–5.11)
RDW: 13.8 % (ref 11.5–15.5)
WBC: 10 10*3/uL (ref 4.0–10.5)

## 2017-06-30 LAB — URINALYSIS, ROUTINE W REFLEX MICROSCOPIC
BILIRUBIN URINE: NEGATIVE
Bacteria, UA: NONE SEEN
Glucose, UA: NEGATIVE mg/dL
KETONES UR: 5 mg/dL — AB
NITRITE: NEGATIVE
Protein, ur: NEGATIVE mg/dL
SPECIFIC GRAVITY, URINE: 1.019 (ref 1.005–1.030)
pH: 5 (ref 5.0–8.0)

## 2017-06-30 LAB — LIPASE, BLOOD: Lipase: 43 U/L (ref 11–51)

## 2017-06-30 MED ORDER — CEPHALEXIN 500 MG PO CAPS: 500.0000 mg | ORAL_CAPSULE | Freq: Two times a day (BID) | ORAL | 0 refills | Status: AC

## 2017-06-30 NOTE — ED Triage Notes (Signed)
Pt comes in from PCP's office for abdominal pain starting one month ago. Denies any n/v/d.

## 2017-06-30 NOTE — Discharge Instructions (Signed)
You have been seen in the Emergency Department (ED) today for pain when urinating.  Your workup today suggests that you have a urinary tract infection (UTI).  Please take your antibiotic as prescribed and over-the-counter pain medication (Tylenol or Motrin) as needed, but no more than recommended on the label instructions.  Drink PLENTY of fluids.  Call your regular doctor to schedule the next available appointment to follow up on today?s ED visit, or return immediately to the ED if your pain worsens, you have decreased urine production, develop fever, persistent vomiting, or other symptoms that concern you.

## 2017-06-30 NOTE — Progress Notes (Signed)
   Subjective:    Patient ID: Christine Newman, female    DOB: [birth year], 76 y.o.   MRN: [id number]  HPI Patient states she called her GI surgeon he recommended she contact her primary care   Patient arrives with c/o abdominal pain and loss of appetite for a few weeks. Patient has a history of diverticulitis that required hospitalization  Pt hx of diverticulitis in the past, took rice and other food one mo ago and had a flare for about a month  yest pt had stomach pain off and on  No energy or appetite  Patient's main focus of pain is the lower abdomen. Primarily right lower quadrant. Not hungry today. Pain severe at times.  Pt exoeriencing intermit epigast pain  Pt has ensure  Review of Systems No headache, no major weight loss or weight gain, no chest pain no back pain abdominal pain no change in bowel habits complete ROS otherwise negative     Objective:   Physical Exam Alert and oriented, vitals reviewed and stable, NAD ENT-TM's and ext canals WNL bilat via otoscopic exam Soft palate, tonsils and post pharynx WNL via oropharyngeal exam Neck-symmetric, no masses; thyroid nonpalpable and nontender Pulmonary-no tachypnea or accessory muscle use; Clear without wheezes via auscultation Card--no abnrml murmurs, rhythm reg and rate WNL Carotid pulses symmetric, without bruits Abdomen bowel sounds present discrete tenderness right lower quadrant positive suprapubic region. No rebound no guarding. Tearful with discomfort       Assessment & Plan:  Impression probable acute diverticulitis. Of concern is patient in the past is had question of fistula. Worsening severe abdominal pain. I think patient is in need of urgent workup. I spoke with the ER physician. This will be initiated today promptly. Discussion with patient.

## 2017-06-30 NOTE — ED Provider Notes (Signed)
Emergency Department Provider Note   I have reviewed the triage vital signs and the nursing notes.   HISTORY  Chief Complaint Abdominal Pain   HPI Christine Newman is a 76 y.o. female with PMH of CKD, HTN, and Diverticulitis presents to the ED for evaluation of lower abdominal pain over the last 30 days. Patient denies any nausea, vomiting, or diarrhea. No fever or chills. No blood in the BMs or urine. No dysuria, hesitancy, or urgency. She was evaluated by her PCP today who referred her directly to the ED for "a scan." Patient denies any sudden worsening pain. No CP or SOB. No radiation of symptoms. No recent abx.    Past Medical History:  Diagnosis Date  . Anal polyp 2005   condyloma acuminatum  . CKD (chronic kidney disease)    Cr 1.9 -09/2014  . Colonic mass   . Diverticulitis    with abscess  . Hypertension     Patient Active Problem List   Diagnosis Date Noted  . Generalized anxiety disorder 09/27/2015  . Diverticulosis of colon without hemorrhage   . Abnormal computed tomography of cecum and terminal ileum   . Colonic mass 01/16/2015  . CKD (chronic kidney disease) stage 3, GFR 30-59 ml/min 01/14/2015  . AKI (acute kidney injury) ([place]) 01/14/2015  . Hypomagnesemia 01/14/2015  . Diverticulitis of large intestine with abscess without bleeding   . Diverticulitis of intestine with abscess 01/13/2015  . Hypokalemia 01/13/2015  . UTI (lower urinary tract infection)   . Benign paroxysmal positional vertigo 08/27/2013  . Essential hypertension, benign 03/11/2013  . Renal insufficiency 03/11/2013    Past Surgical History:  Procedure Laterality Date  . anal polypectomy  01/02/2004   Dr.Rosenbower- anal polyp removed. bx= condyloma acuminatum  . COLONOSCOPY  04/08/2003   Dr.Mann- small nonbleeding internal and external hemorrhoids small polypoid mass at anal verge, normal appearing L colon, transverse colon, R colon including cecum. stenotic ileocecal valve. bx of  maa= benign rectal type mucosa with features of mucosal prolapse syndrome  . COLONOSCOPY N/A 04/02/2015   Dr.Rourk- colonic diverticulosis, no evidence of colonic neoplasm. abnormal ileocecal valve with ulceration bx= ulcerated/eroded benign ileocecal valve mucosa  . FLEXIBLE SIGMOIDOSCOPY  11/06/2003   Dr.Mann- small nodular mass at anal verge, bx done. early L side diverticula. bx= benign rectal mucosa with features of mucosal prolapse syndrome. no adenomatous changes or evidence of malignancy identified.   Marland Kitchen [facility] SURGERY      Current Outpatient Rx  . Order #: [id number] Class: Historical Med  . Order #: [id number] Class: Historical Med  . Order #: [id number] Class: Normal  . Order #: [id number] Class: Historical Med  . Order #: [id number] Class: Normal  . Order #: [id number] Class: Print    Allergies Patient has no known allergies.  Family History  Problem Relation Age of Onset  . Hypertension Mother   . Heart disease Father   . Cancer Sister        breast  . Colon cancer Neg Hx     Social History Social History  Substance Use Topics  . Smoking status: Former Smoker    Years: 5.00    Types: Cigarettes  . Smokeless tobacco: Never Used     Comment: 1 pack cigarettes per week   . Alcohol use No    Review of Systems  Constitutional: No fever/chills Eyes: No visual changes. ENT: No sore throat. Cardiovascular: Denies chest pain. Respiratory: Denies shortness of breath. Gastrointestinal: Positive lower abdominal pain.  No nausea,  no vomiting.  No diarrhea.  No constipation. Genitourinary: Negative for dysuria. Musculoskeletal: Negative for back pain. Skin: Negative for rash. Neurological: Negative for headaches, focal weakness or numbness.  10-point ROS otherwise negative.  ____________________________________________   PHYSICAL EXAM:  VITAL SIGNS: ED Triage Vitals  Enc Vitals Group     BP 06/30/17 1239 (!) 136/96     Pulse Rate 06/30/17 1238 87     Resp 06/30/17  1238 18     Temp 06/30/17 1238 97.9 F (36.6 C)     Temp Source 06/30/17 1238 Oral     SpO2 06/30/17 1238 95 %     Weight 06/30/17 1239 152 lb (68.9 kg)     Height 06/30/17 1239 5' 5.98" (1.676 m)     Pain Score 06/30/17 1238 3   Constitutional: Alert and oriented. Well appearing and in no acute distress. Eyes: Conjunctivae are normal. Head: Atraumatic. Nose: No congestion/rhinnorhea. Mouth/Throat: Mucous membranes are moist.  Oropharynx non-erythematous. Neck: No stridor.  Cardiovascular: Normal rate, regular rhythm. Good peripheral circulation. Grossly normal heart sounds.   Respiratory: Normal respiratory effort.  No retractions. Lungs CTAB. Gastrointestinal: Soft with lower abdominal tenderness to palpation. No rebound or guarding. No distention.  Musculoskeletal: No lower extremity tenderness nor edema. No gross deformities of extremities. Neurologic:  Normal speech and language. No gross focal neurologic deficits are appreciated.  Skin:  Skin is warm, dry and intact. No rash noted.  ____________________________________________   LABS (all labs ordered are listed, but only abnormal results are displayed)  Labs Reviewed  COMPREHENSIVE METABOLIC PANEL - Abnormal; Notable for the following:       Result Value   Chloride 99 (*)    Creatinine, Ser 1.52 (*)    ALT 11 (*)    GFR calc non Af Amer 32 (*)    GFR calc Af Amer 38 (*)    All other components within normal limits  URINALYSIS, ROUTINE W REFLEX MICROSCOPIC - Abnormal; Notable for the following:    APPearance HAZY (*)    Hgb urine dipstick SMALL (*)    Ketones, ur 5 (*)    Leukocytes, UA LARGE (*)    Squamous Epithelial / LPF 0-5 (*)    All other components within normal limits  URINE CULTURE  LIPASE, BLOOD  CBC WITH DIFFERENTIAL/PLATELET   ____________________________________________  RADIOLOGY  Ct Abdomen Pelvis Wo Contrast  Result Date: 06/30/2017 CLINICAL DATA:  Lower abdominal pain. EXAM: CT ABDOMEN AND  PELVIS WITHOUT CONTRAST TECHNIQUE: Multidetector CT imaging of the abdomen and pelvis was performed following the standard protocol without IV contrast. COMPARISON:  CT scan of July 24, 2015. FINDINGS: Lower chest: No acute abnormality. Hepatobiliary: No focal liver abnormality is seen. No gallstones, gallbladder wall thickening, or biliary dilatation. Pancreas: Unremarkable. No pancreatic ductal dilatation or surrounding inflammatory changes. Spleen: Normal in size without focal abnormality. Adrenals/Urinary Tract: Adrenal glands appear normal. Nonobstructive right renal calculus is noted. Severe left renal atrophy is noted. 18 x 10 mm calculus is seen in lower pole calyx of left kidney. No hydronephrosis or renal obstruction is noted. Urinary bladder is unremarkable. Stomach/Bowel: The appendix appears normal. The stomach appears normal. There is noted 21 x 16 mm irregular air collection and surrounding scarring which appears to extend to sigmoid colon and adjacent small bowel. This may represent scarring or sequela from previous inflammation. There is severe wall thickening involving a loop of small bowel adjacent to this area consistent with focal enteritis or inflammation. No small bowel dilatation  is noted otherwise. Vascular/Lymphatic: Aortic atherosclerosis. No enlarged abdominal or pelvic lymph nodes. Reproductive: Uterus and bilateral adnexa are unremarkable. Other: No abdominal wall hernia or abnormality. No abdominopelvic ascites. Musculoskeletal: No acute or significant osseous findings. IMPRESSION: Severe focal small bowel dilatation is seen in the pelvis anteriorly concerning for focal enteritis or inflammation. It is adjacent to area of probable scarring or other sequela from previous inflammation near sigmoid colon. Possible enterocolic fistula cannot be excluded. Nonobstructive right renal calculus. Severe left renal atrophy is noted with 18 x 10 mm calculus seen in lower pole calyx of left  kidney. No hydronephrosis or renal obstruction is noted. Aortic atherosclerosis. Electronically Signed   By: Marijo Conception, M.D.   On: 06/30/2017 17:57    ____________________________________________   PROCEDURES  Procedure(s) performed:   Procedures  None ____________________________________________   INITIAL IMPRESSION / ASSESSMENT AND PLAN / ED COURSE  Pertinent labs & imaging results that were available during my care of the patient were reviewed by me and considered in my medical decision making (see chart for details).  Patient presents to the ED for evaluation of lower abdominal pain for the last month. Patient was sent to the ED by PCP Dr. Wolfgang Phoenix after office evaluation. Some clinical concern for diverticulitis. Last episode was 2 years prior. Patient with some mild lower abdominal discomfort on exam with no rebound or guarding. CT and labs ordered from triage.   CT with focal enteritis. Patient with no diarrhea and feeling much better. No abscess. No abx for CT findings. Will treat UTI with Keflex and have patient f/u with PCP. Patient is completely pain free at discharge.   At this time, I do not feel there is any life-threatening condition present. I have reviewed and discussed all results (EKG, imaging, lab, urine as appropriate), exam findings with patient. I have reviewed nursing notes and appropriate previous records.  I feel the patient is safe to be discharged home without further emergent workup. Discussed usual and customary return precautions. Patient and family (if present) verbalize understanding and are comfortable with this plan.  Patient will follow-up with their primary care provider. If they do not have a primary care provider, information for follow-up has been provided to them. All questions have been answered.  ____________________________________________  FINAL CLINICAL IMPRESSION(S) / ED DIAGNOSES  Final diagnoses:  Lower abdominal pain  Acute  cystitis without hematuria  Enteritis     MEDICATIONS GIVEN DURING THIS VISIT:  Medications - No data to display   NEW OUTPATIENT MEDICATIONS STARTED DURING THIS VISIT:  Discharge Medication List as of 06/30/2017  6:08 PM    START taking these medications   Details  cephALEXin (KEFLEX) 500 MG capsule Take 1 capsule (500 mg total) by mouth 2 (two) times daily., Starting Thu 06/30/2017, Until Thu 07/07/2017, Print        Note:  This document was prepared using Dragon voice recognition software and may include unintentional dictation errors.  Nanda Quinton, MD Emergency Medicine    Chipper Koudelka, [facility], MD 07/01/17 1116

## 2017-07-02 LAB — URINE CULTURE: SPECIAL REQUESTS: NORMAL

## 2017-07-03 ENCOUNTER — Telehealth: Payer: Self-pay

## 2017-07-03 NOTE — Telephone Encounter (Signed)
Post ED Visit - Positive Culture Follow-up  Culture report reviewed by antimicrobial stewardship pharmacist:  []  Elenor Quinones, Pharm.D. []  Heide Guile, Pharm.D., BCPS AQ-ID []  Parks Neptune, Pharm.D., BCPS []  Alycia Rossetti, Pharm.D., BCPS []  [place], [place].D., BCPS, AAHIVP []  Legrand Como, Pharm.D., BCPS, AAHIVP []  Salome Arnt, PharmD, BCPS []  Dimitri Ped, PharmD, BCPS []  Vincenza Hews, PharmD, BCPS [facility] Pharm D Positive urine culture Treated with Cephalexin, organism sensitive to the same and no further patient follow-up is required at this time.  Genia Del 07/03/2017, 9:17 AM

## 2017-07-06 ENCOUNTER — Encounter: Payer: Self-pay | Admitting: Family Medicine

## 2017-07-06 ENCOUNTER — Ambulatory Visit (INDEPENDENT_AMBULATORY_CARE_PROVIDER_SITE_OTHER): Payer: Medicare Other | Admitting: Family Medicine

## 2017-07-06 VITALS — BP 120/74 | Temp 99.1°F | Ht 65.0 in | Wt 152.0 lb

## 2017-07-06 DIAGNOSIS — Z8719 Personal history of other diseases of the digestive system: Secondary | ICD-10-CM | POA: Diagnosis not present

## 2017-07-06 DIAGNOSIS — R1030 Lower abdominal pain, unspecified: Secondary | ICD-10-CM | POA: Diagnosis not present

## 2017-07-06 MED ORDER — CIPROFLOXACIN HCL 500 MG PO TABS: 500.0000 mg | ORAL_TABLET | Freq: Two times a day (BID) | ORAL | 0 refills | Status: DC

## 2017-07-06 MED ORDER — METRONIDAZOLE 500 MG PO TABS: 500.0000 mg | ORAL_TABLET | Freq: Three times a day (TID) | ORAL | 0 refills | Status: DC

## 2017-07-06 NOTE — Progress Notes (Signed)
   Subjective:    Patient ID: Christine Newman, female    DOB: [birth year], 76 y.o.   MRN: [id number]  Abdominal Pain  This is a new (ER follow up) problem. Associated symptoms include nausea.  low grade fever and nausea today. No abdominal pain. Taking keflex that the ED prescribed.  Urine culture, bloodwork and scan done in the ED.   Patient returns for follow-up from the emergency room. At that point last week patient was advised to follow-up with me. She did not. We called her today and brought her in.  Patient continues to manifest off-and-on abdominal symptoms. Low abdomen aching pain. Positive malaise. Positive diminished energy. No obvious fever or chills. No dysuria.  Review of ER report shows normal white blood count. Urine was positive for lactobacillus 80,000 CFU's. CT scan concern for ongoing challenges see below  Review of Systems  Gastrointestinal: Positive for abdominal pain and nausea.       Objective:   Physical Exam  Alert and oriented, vitals reviewed and stable, NAD ENT-TM's and ext canals WNL bilat via otoscopic exam Soft palate, tonsils and post pharynx WNL via oropharyngeal exam Neck-symmetric, no masses; thyroid nonpalpable and nontender Pulmonary-no tachypnea or accessory muscle use; Clear without wheezes via auscultation Card--no abnrml murmurs, rhythm reg and rate WNL Carotid pulses symmetric, without bruits Low abdomen diffuse tenderness worse in mid suprapubic and left lower quadrant region no rebound no guarding bowel sunds intact       Assessment & Plan:  Impression recurrent low abdominal pain/tenderness with known history of diverticulitis and questionable past history of enterocolic fistula. Now with ongoing subacute symptomatology. In scan with dilated small bowel loop in changes which appear consistent with ongoing GI difficulties. Discussed at length with patient. Discussed with patient surgeon that Dr. Buford Dresser originally sent her to a couple  years ago. They will reassess for potential for surgery. I think that she will need some type of intervention in this regard. Discussed at length warning signs discussed. Dr. Brantley Stage said [facility] will be contacting

## 2017-09-08 ENCOUNTER — Other Ambulatory Visit: Payer: Self-pay | Admitting: General Surgery

## 2017-09-08 DIAGNOSIS — K5792 Diverticulitis of intestine, part unspecified, without perforation or abscess without bleeding: Secondary | ICD-10-CM

## 2017-09-15 ENCOUNTER — Ambulatory Visit
Admission: RE | Admit: 2017-09-15 | Discharge: 2017-09-15 | Disposition: A | Discharge status: Home / Self Care | Payer: Medicare Other | Source: Ambulatory Visit | Attending: General Surgery | Admitting: General Surgery

## 2017-09-15 ENCOUNTER — Other Ambulatory Visit: Payer: Medicare Other

## 2017-09-15 DIAGNOSIS — K5792 Diverticulitis of intestine, part unspecified, without perforation or abscess without bleeding: Secondary | ICD-10-CM

## 2017-09-16 ENCOUNTER — Telehealth: Payer: Self-pay | Admitting: General Surgery

## 2017-09-16 ENCOUNTER — Ambulatory Visit: Payer: Self-pay | Admitting: General Surgery

## 2017-09-16 NOTE — Telephone Encounter (Signed)
Called to discuss patient CT findings.  I am concerned that she may have Crohn's disease.  We discussed repeating her colonoscopy with terminal ileum intubation and biopsy versus exploratory laparoscopy and possible ileocecectomy.  She would like to proceed with the latter.

## 2017-09-22 ENCOUNTER — Other Ambulatory Visit: Payer: Self-pay | Admitting: Family Medicine

## 2017-09-26 ENCOUNTER — Encounter: Payer: Self-pay | Admitting: Family Medicine

## 2017-09-26 ENCOUNTER — Ambulatory Visit: Payer: Medicare Other | Admitting: Family Medicine

## 2017-09-26 VITALS — BP 134/88 | Ht 66.0 in | Wt 146.0 lb

## 2017-09-26 DIAGNOSIS — N183 Chronic kidney disease, stage 3 unspecified: Secondary | ICD-10-CM

## 2017-09-26 DIAGNOSIS — R1030 Lower abdominal pain, unspecified: Secondary | ICD-10-CM | POA: Diagnosis not present

## 2017-09-26 DIAGNOSIS — I10 Essential (primary) hypertension: Secondary | ICD-10-CM | POA: Diagnosis not present

## 2017-09-26 MED ORDER — VERAPAMIL HCL ER 180 MG PO TBCR: EXTENDED_RELEASE_TABLET | ORAL | 1 refills | Status: DC

## 2017-09-26 MED ORDER — HYDROCHLOROTHIAZIDE 25 MG PO TABS: ORAL_TABLET | ORAL | 1 refills | Status: DC

## 2017-09-26 NOTE — Progress Notes (Signed)
   Subjective:    Patient ID: Christine Newman, female    DOB: [birth year], 76 y.o.   MRN: [id number]  Hypertension  This is a chronic problem. Treatments tried: verapamil. There are no compliance problems (takes meds every day, eats healthy, tries to exercise as much as possible).    Pt states no concerns today.   Blood pressure medicine and blood pressure levels reviewed today with patient. Compliant with blood pressure medicine. States does not miss a dose. No obvious side effects. Blood pressure generally good when checked elsewhere. Watching salt intake.  Patient has chronic renal insufficiency.  Patient.  Prior visit with specialist and note reviewed with patient.  Ongoing abdominal symptoms.  Saw the specialist.  They recommended excision distal ileum.  Review of Systems No headache, no major weight loss or weight gain, no chest pain no back pain abdominal pain no change in bowel habits complete ROS otherwise negative     Objective:   Physical Exam  Alert and oriented, vitals reviewed and stable, NAD ENT-TM's and ext canals WNL bilat via otoscopic exam Soft palate, tonsils and post pharynx WNL via oropharyngeal exam Neck-symmetric, no masses; thyroid nonpalpable and nontender Pulmonary-no tachypnea or accessory muscle use; Clear without wheezes via auscultation Card--no abnrml murmurs, rhythm reg and rate WNL Carotid pulses symmetric, without bruits       Assessment & Plan:  Impression 1 hypertension good control discussed to maintain same  2.  Chronic renal insufficiency discussed creatinine stable to maintain same  3.  Recurrent diverticulitis now with scarring distal ileum need for excision discussion held at this point encouraged: On doing the surgery.  Greater than 50% of this 25 minute face to face visit was spent in counseling and discussion and coordination of care regarding the above diagnosis/diagnosies

## 2017-10-26 NOTE — Patient Instructions (Signed)
Christine Newman  10/26/2017   Your procedure is scheduled on: 11-02-17  Report to [facility] Main  Entrance    Report to admitting at 6:30AM   Call this number if you have problems the morning of surgery [phone]     Remember: Do not eat food or drink liquids :After Midnight.     Take these medicines the morning of surgery with A SIP OF WATER: NONE                                You may not have any metal on your body including hair pins and              piercings  Do not wear jewelry, make-up, lotions, powders or perfumes, deodorant             Do not wear nail polish.  Do not shave  48 hours prior to surgery.        Do not bring valuables to the hospital. [place].  Contacts, dentures or bridgework may not be worn into surgery.  Leave suitcase in the car. After surgery it may be brought to your room.               Please read over the following fact sheets you were given: _____________________________________________________________________             [facility] - Preparing for Surgery Before surgery, you can play an important role.  Because skin is not sterile, your skin needs to be as free of germs as possible.  You can reduce the number of germs on your skin by washing with CHG (chlorahexidine gluconate) soap before surgery.  CHG is an antiseptic cleaner which kills germs and bonds with the skin to continue killing germs even after washing. Please DO NOT use if you have an allergy to CHG or antibacterial soaps.  If your skin becomes reddened/irritated stop using the CHG and inform your nurse when you arrive at Short Stay. Do not shave (including legs and underarms) for at least 48 hours prior to the first CHG shower.  You may shave your face/neck. Please follow these instructions carefully:  1.  Shower with CHG Soap the night before surgery and the  morning of Surgery.  2.  If you choose to  wash your hair, wash your hair first as usual with your  normal  shampoo.  3.  After you shampoo, rinse your hair and body thoroughly to remove the  shampoo.                           4.  Use CHG as you would any other liquid soap.  You can apply chg directly  to the skin and wash                       Gently with a scrungie or clean washcloth.  5.  Apply the CHG Soap to your body ONLY FROM THE NECK DOWN.   Do not use on face/ open  Wound or open sores. Avoid contact with eyes, ears mouth and genitals (private parts).                       Wash face,  Genitals (private parts) with your normal soap.             6.  Wash thoroughly, paying special attention to the area where your surgery  will be performed.  7.  Thoroughly rinse your body with warm water from the neck down.  8.  DO NOT shower/wash with your normal soap after using and rinsing off  the CHG Soap.                9.  Pat yourself dry with a clean towel.            10.  Wear clean pajamas.            11.  Place clean sheets on your bed the night of your first shower and do not  sleep with pets. Day of Surgery : Do not apply any lotions/deodorants the morning of surgery.  Please wear clean clothes to the hospital/surgery center.  FAILURE TO FOLLOW THESE INSTRUCTIONS MAY RESULT IN THE CANCELLATION OF YOUR SURGERY PATIENT SIGNATURE_________________________________  NURSE SIGNATURE__________________________________  ________________________________________________________________________

## 2017-10-28 ENCOUNTER — Encounter (HOSPITAL_COMMUNITY)
Admission: RE | Admit: 2017-10-28 | Discharge: 2017-10-28 | Disposition: A | Discharge status: Home / Self Care | Payer: Medicare Other | Source: Ambulatory Visit | Attending: General Surgery | Admitting: General Surgery

## 2017-10-28 ENCOUNTER — Encounter (INDEPENDENT_AMBULATORY_CARE_PROVIDER_SITE_OTHER): Payer: Self-pay

## 2017-10-28 ENCOUNTER — Encounter (HOSPITAL_COMMUNITY): Payer: Self-pay

## 2017-10-28 ENCOUNTER — Other Ambulatory Visit: Payer: Self-pay

## 2017-10-28 DIAGNOSIS — K529 Noninfective gastroenteritis and colitis, unspecified: Secondary | ICD-10-CM | POA: Insufficient documentation

## 2017-10-28 DIAGNOSIS — R9431 Abnormal electrocardiogram [ECG] [EKG]: Secondary | ICD-10-CM | POA: Insufficient documentation

## 2017-10-28 DIAGNOSIS — I10 Essential (primary) hypertension: Secondary | ICD-10-CM | POA: Insufficient documentation

## 2017-10-28 DIAGNOSIS — Z01818 Encounter for other preprocedural examination: Secondary | ICD-10-CM | POA: Diagnosis not present

## 2017-10-28 LAB — CBC
HCT: 40.3 % (ref 36.0–46.0)
HEMOGLOBIN: 12.5 g/dL (ref 12.0–15.0)
MCH: 28.8 pg (ref 26.0–34.0)
MCHC: 31 g/dL (ref 30.0–36.0)
MCV: 92.9 fL (ref 78.0–100.0)
Platelets: 351 10*3/uL (ref 150–400)
RBC: 4.34 MIL/uL (ref 3.87–5.11)
RDW: 14.1 % (ref 11.5–15.5)
WBC: 7.1 10*3/uL (ref 4.0–10.5)

## 2017-10-28 LAB — BASIC METABOLIC PANEL
Anion gap: 7 (ref 5–15)
BUN: 20 mg/dL (ref 6–20)
CO2: 28 mmol/L (ref 22–32)
CREATININE: 1.48 mg/dL — AB (ref 0.44–1.00)
Calcium: 9.2 mg/dL (ref 8.9–10.3)
Chloride: 105 mmol/L (ref 101–111)
GFR calc non Af Amer: 33 mL/min — ABNORMAL LOW (ref >60)
GFR, EST AFRICAN AMERICAN: 39 mL/min — AB (ref >60)
Glucose, Bld: 90 mg/dL (ref 65–99)
POTASSIUM: 3.6 mmol/L (ref 3.5–5.1)
Sodium: 140 mmol/L (ref 135–145)

## 2017-11-01 MED ORDER — BUPIVACAINE LIPOSOME 1.3 % IJ SUSP
20.0000 mL | INTRAMUSCULAR | Status: DC
  Filled 2017-11-01: qty 20

## 2017-11-02 ENCOUNTER — Inpatient Hospital Stay (HOSPITAL_COMMUNITY)
Admission: RE | Admit: 2017-11-02 | Discharge: 2017-11-05 | DRG: 331 | Disposition: A | Discharge status: Home / Self Care | Payer: Medicare Other | Attending: General Surgery | Admitting: General Surgery

## 2017-11-02 ENCOUNTER — Other Ambulatory Visit: Payer: Self-pay

## 2017-11-02 ENCOUNTER — Inpatient Hospital Stay (HOSPITAL_COMMUNITY): Payer: Medicare Other | Admitting: Anesthesiology

## 2017-11-02 ENCOUNTER — Encounter (HOSPITAL_COMMUNITY): Payer: Self-pay | Admitting: *Deleted

## 2017-11-02 ENCOUNTER — Encounter (HOSPITAL_COMMUNITY)
Admission: RE | Disposition: A | Discharge status: Home / Self Care | Payer: Self-pay | Source: Home / Self Care | Attending: General Surgery

## 2017-11-02 DIAGNOSIS — Z87891 Personal history of nicotine dependence: Secondary | ICD-10-CM

## 2017-11-02 DIAGNOSIS — N183 Chronic kidney disease, stage 3 (moderate): Secondary | ICD-10-CM | POA: Diagnosis present

## 2017-11-02 DIAGNOSIS — Z8249 Family history of ischemic heart disease and other diseases of the circulatory system: Secondary | ICD-10-CM

## 2017-11-02 DIAGNOSIS — R109 Unspecified abdominal pain: Secondary | ICD-10-CM | POA: Diagnosis present

## 2017-11-02 DIAGNOSIS — K50912 Crohn's disease, unspecified, with intestinal obstruction: Principal | ICD-10-CM | POA: Diagnosis present

## 2017-11-02 DIAGNOSIS — I129 Hypertensive chronic kidney disease with stage 1 through stage 4 chronic kidney disease, or unspecified chronic kidney disease: Secondary | ICD-10-CM | POA: Diagnosis present

## 2017-11-02 DIAGNOSIS — K5090 Crohn's disease, unspecified, without complications: Secondary | ICD-10-CM | POA: Diagnosis present

## 2017-11-02 DIAGNOSIS — K56609 Unspecified intestinal obstruction, unspecified as to partial versus complete obstruction: Secondary | ICD-10-CM | POA: Diagnosis present

## 2017-11-02 DIAGNOSIS — Z79899 Other long term (current) drug therapy: Secondary | ICD-10-CM

## 2017-11-02 DIAGNOSIS — Z87442 Personal history of urinary calculi: Secondary | ICD-10-CM

## 2017-11-02 DIAGNOSIS — K660 Peritoneal adhesions (postprocedural) (postinfection): Secondary | ICD-10-CM | POA: Diagnosis present

## 2017-11-02 DIAGNOSIS — Z7982 Long term (current) use of aspirin: Secondary | ICD-10-CM | POA: Diagnosis not present

## 2017-11-02 HISTORY — PX: LAPAROSCOPY: SHX197

## 2017-11-02 SURGERY — LAPAROSCOPY, DIAGNOSTIC
Anesthesia: General | Site: Abdomen

## 2017-11-02 MED ORDER — BUPIVACAINE LIPOSOME 1.3 % IJ SUSP
INTRAMUSCULAR | Status: DC | PRN
  Administered 2017-11-02: 20 mL

## 2017-11-02 MED ORDER — ESMOLOL HCL 100 MG/10ML IV SOLN
INTRAVENOUS | Status: AC
  Filled 2017-11-02: qty 10

## 2017-11-02 MED ORDER — ONDANSETRON HCL 4 MG/2ML IJ SOLN
INTRAMUSCULAR | Status: DC | PRN
  Administered 2017-11-02: 4 mg via INTRAVENOUS

## 2017-11-02 MED ORDER — MIDAZOLAM HCL 2 MG/2ML IJ SOLN
INTRAMUSCULAR | Status: AC
  Filled 2017-11-02: qty 2

## 2017-11-02 MED ORDER — ALVIMOPAN 12 MG PO CAPS
12.0000 mg | ORAL_CAPSULE | Freq: Two times a day (BID) | ORAL | Status: DC
  Administered 2017-11-03 – 2017-11-04 (×3): 12 mg via ORAL
  Filled 2017-11-02 (×3): qty 1

## 2017-11-02 MED ORDER — ALUM & MAG HYDROXIDE-SIMETH 200-200-20 MG/5ML PO SUSP: 30.0000 mL | Freq: Four times a day (QID) | ORAL | Status: DC | PRN

## 2017-11-02 MED ORDER — LACTATED RINGERS IV SOLN
INTRAVENOUS | Status: DC
  Administered 2017-11-02: 07:00:00 via INTRAVENOUS

## 2017-11-02 MED ORDER — FENTANYL CITRATE (PF) 100 MCG/2ML IJ SOLN
INTRAMUSCULAR | Status: AC
  Filled 2017-11-02: qty 2

## 2017-11-02 MED ORDER — SUGAMMADEX SODIUM 200 MG/2ML IV SOLN
INTRAVENOUS | Status: DC | PRN
  Administered 2017-11-02: 200 mg via INTRAVENOUS

## 2017-11-02 MED ORDER — ENOXAPARIN SODIUM 40 MG/0.4ML ~~LOC~~ SOLN
40.0000 mg | SUBCUTANEOUS | Status: DC
  Administered 2017-11-03 – 2017-11-05 (×2): 40 mg via SUBCUTANEOUS
  Filled 2017-11-02 (×2): qty 0.4

## 2017-11-02 MED ORDER — ACETAMINOPHEN 500 MG PO TABS
1000.0000 mg | ORAL_TABLET | Freq: Four times a day (QID) | ORAL | Status: DC
  Administered 2017-11-03 – 2017-11-05 (×6): 1000 mg via ORAL
  Filled 2017-11-02 (×8): qty 2

## 2017-11-02 MED ORDER — ACETAMINOPHEN 500 MG PO TABS
1000.0000 mg | ORAL_TABLET | ORAL | Status: AC
  Administered 2017-11-02: 1000 mg via ORAL
  Filled 2017-11-02: qty 2

## 2017-11-02 MED ORDER — KETAMINE HCL 10 MG/ML IJ SOLN
INTRAMUSCULAR | Status: AC
  Filled 2017-11-02: qty 1

## 2017-11-02 MED ORDER — BUPIVACAINE HCL (PF) 0.25 % IJ SOLN
INTRAMUSCULAR | Status: AC
  Filled 2017-11-02: qty 30

## 2017-11-02 MED ORDER — DEXAMETHASONE SODIUM PHOSPHATE 10 MG/ML IJ SOLN
INTRAMUSCULAR | Status: AC
  Filled 2017-11-02: qty 1

## 2017-11-02 MED ORDER — DIPHENHYDRAMINE HCL 12.5 MG/5ML PO ELIX: 12.5000 mg | ORAL_SOLUTION | Freq: Four times a day (QID) | ORAL | Status: DC | PRN

## 2017-11-02 MED ORDER — ONDANSETRON HCL 4 MG/2ML IJ SOLN
INTRAMUSCULAR | Status: AC
  Filled 2017-11-02: qty 2

## 2017-11-02 MED ORDER — PROMETHAZINE HCL 25 MG/ML IJ SOLN
6.2500 mg | INTRAMUSCULAR | Status: DC | PRN
  Administered 2017-11-02: 6.25 mg via INTRAVENOUS

## 2017-11-02 MED ORDER — ESMOLOL HCL 100 MG/10ML IV SOLN
INTRAVENOUS | Status: DC | PRN
  Administered 2017-11-02 (×2): 10 mg via INTRAVENOUS

## 2017-11-02 MED ORDER — ONDANSETRON HCL 4 MG PO TABS: 4.0000 mg | ORAL_TABLET | Freq: Four times a day (QID) | ORAL | Status: DC | PRN

## 2017-11-02 MED ORDER — BUPIVACAINE HCL (PF) 0.25 % IJ SOLN
INTRAMUSCULAR | Status: DC | PRN
  Administered 2017-11-02: 30 mL

## 2017-11-02 MED ORDER — LIDOCAINE 2% (20 MG/ML) 5 ML SYRINGE
INTRAMUSCULAR | Status: DC | PRN
  Administered 2017-11-02: 1.5 mg/kg/h via INTRAVENOUS

## 2017-11-02 MED ORDER — DIPHENHYDRAMINE HCL 50 MG/ML IJ SOLN: 12.5000 mg | Freq: Four times a day (QID) | INTRAMUSCULAR | Status: DC | PRN

## 2017-11-02 MED ORDER — PROMETHAZINE HCL 25 MG/ML IJ SOLN
INTRAMUSCULAR | Status: AC
  Filled 2017-11-02: qty 1

## 2017-11-02 MED ORDER — KETAMINE HCL 10 MG/ML IJ SOLN
INTRAMUSCULAR | Status: DC | PRN
  Administered 2017-11-02 (×3): 10 mg via INTRAVENOUS

## 2017-11-02 MED ORDER — PROPOFOL 500 MG/50ML IV EMUL: INTRAVENOUS | Status: DC | PRN

## 2017-11-02 MED ORDER — SUGAMMADEX SODIUM 200 MG/2ML IV SOLN
INTRAVENOUS | Status: AC
  Filled 2017-11-02: qty 2

## 2017-11-02 MED ORDER — MORPHINE SULFATE (PF) 2 MG/ML IV SOLN
2.0000 mg | INTRAVENOUS | Status: DC | PRN
  Administered 2017-11-02 – 2017-11-03 (×3): 2 mg via INTRAVENOUS
  Filled 2017-11-02 (×3): qty 1

## 2017-11-02 MED ORDER — 0.9 % SODIUM CHLORIDE (POUR BTL) OPTIME
TOPICAL | Status: DC | PRN
  Administered 2017-11-02: 2000 mL

## 2017-11-02 MED ORDER — PROPOFOL 10 MG/ML IV BOLUS
INTRAVENOUS | Status: AC
  Filled 2017-11-02: qty 20

## 2017-11-02 MED ORDER — ONDANSETRON HCL 4 MG/2ML IJ SOLN
4.0000 mg | Freq: Four times a day (QID) | INTRAMUSCULAR | Status: DC | PRN
  Administered 2017-11-03 – 2017-11-04 (×3): 4 mg via INTRAVENOUS
  Filled 2017-11-02 (×3): qty 2

## 2017-11-02 MED ORDER — LIDOCAINE 2% (20 MG/ML) 5 ML SYRINGE
INTRAMUSCULAR | Status: DC | PRN
  Administered 2017-11-02: 60 mg via INTRAVENOUS

## 2017-11-02 MED ORDER — LACTATED RINGERS IR SOLN
Status: DC | PRN
  Administered 2017-11-02: 1000 mL

## 2017-11-02 MED ORDER — LIDOCAINE HCL (PF) 2 % IJ SOLN: INTRAMUSCULAR | Status: DC | PRN

## 2017-11-02 MED ORDER — PHENYLEPHRINE 40 MCG/ML (10ML) SYRINGE FOR IV PUSH (FOR BLOOD PRESSURE SUPPORT)
PREFILLED_SYRINGE | INTRAVENOUS | Status: DC | PRN
  Administered 2017-11-02 (×3): 80 ug via INTRAVENOUS

## 2017-11-02 MED ORDER — HYDROCHLOROTHIAZIDE 25 MG PO TABS
25.0000 mg | ORAL_TABLET | Freq: Every day | ORAL | Status: DC
  Administered 2017-11-03 – 2017-11-05 (×2): 25 mg via ORAL
  Filled 2017-11-02 (×2): qty 1

## 2017-11-02 MED ORDER — VERAPAMIL HCL ER 180 MG PO TBCR
180.0000 mg | EXTENDED_RELEASE_TABLET | Freq: Every day | ORAL | Status: DC
  Administered 2017-11-02 – 2017-11-04 (×3): 180 mg via ORAL
  Filled 2017-11-02 (×3): qty 1

## 2017-11-02 MED ORDER — PROPOFOL 10 MG/ML IV BOLUS
INTRAVENOUS | Status: DC | PRN
  Administered 2017-11-02: 150 mg via INTRAVENOUS

## 2017-11-02 MED ORDER — ALVIMOPAN 12 MG PO CAPS
12.0000 mg | ORAL_CAPSULE | ORAL | Status: AC
  Administered 2017-11-02: 12 mg via ORAL
  Filled 2017-11-02: qty 1

## 2017-11-02 MED ORDER — MIDAZOLAM HCL 5 MG/5ML IJ SOLN
INTRAMUSCULAR | Status: DC | PRN
  Administered 2017-11-02: 1 mg via INTRAVENOUS

## 2017-11-02 MED ORDER — DEXTROSE 5 % IV SOLN
2.0000 g | Freq: Two times a day (BID) | INTRAVENOUS | Status: AC
  Administered 2017-11-02: 2 g via INTRAVENOUS
  Filled 2017-11-02: qty 2

## 2017-11-02 MED ORDER — ROCURONIUM BROMIDE 100 MG/10ML IV SOLN
INTRAVENOUS | Status: DC | PRN
  Administered 2017-11-02: 50 mg via INTRAVENOUS

## 2017-11-02 MED ORDER — FENTANYL CITRATE (PF) 100 MCG/2ML IJ SOLN
INTRAMUSCULAR | Status: DC | PRN
  Administered 2017-11-02 (×3): 50 ug via INTRAVENOUS

## 2017-11-02 MED ORDER — KCL IN DEXTROSE-NACL 20-5-0.45 MEQ/L-%-% IV SOLN
INTRAVENOUS | Status: DC
  Administered 2017-11-02 – 2017-11-03 (×2): via INTRAVENOUS
  Filled 2017-11-02 (×4): qty 1000

## 2017-11-02 MED ORDER — DEXAMETHASONE SODIUM PHOSPHATE 10 MG/ML IJ SOLN
INTRAMUSCULAR | Status: DC | PRN
  Administered 2017-11-02: 10 mg via INTRAVENOUS

## 2017-11-02 MED ORDER — CEFOTETAN DISODIUM-DEXTROSE 2-2.08 GM-%(50ML) IV SOLR
2.0000 g | INTRAVENOUS | Status: AC
  Administered 2017-11-02: 2 g via INTRAVENOUS
  Filled 2017-11-02: qty 50

## 2017-11-02 MED ORDER — FENTANYL CITRATE (PF) 100 MCG/2ML IJ SOLN
25.0000 ug | INTRAMUSCULAR | Status: DC | PRN
  Administered 2017-11-02: 50 ug via INTRAVENOUS

## 2017-11-02 SURGICAL SUPPLY — 56 items
APPLIER CLIP 5 13 M/L LIGAMAX5 (MISCELLANEOUS)
APPLIER CLIP ROT 10 11.4 M/L (STAPLE)
BLADE EXTENDED COATED 6.5IN (ELECTRODE) IMPLANT
CELLS DAT CNTRL 66122 CELL SVR (MISCELLANEOUS) ×2 IMPLANT
CHLORAPREP W/TINT 26ML (MISCELLANEOUS) ×4 IMPLANT
CLIP APPLIE 5 13 M/L LIGAMAX5 (MISCELLANEOUS) IMPLANT
CLIP APPLIE ROT 10 11.4 M/L (STAPLE) IMPLANT
COVER MAYO STAND STRL (DRAPES) IMPLANT
DECANTER SPIKE VIAL GLASS SM (MISCELLANEOUS) ×4 IMPLANT
DERMABOND ADVANCED (GAUZE/BANDAGES/DRESSINGS) ×2
DERMABOND ADVANCED .7 DNX12 (GAUZE/BANDAGES/DRESSINGS) ×2 IMPLANT
DRSG OPSITE POSTOP 4X6 (GAUZE/BANDAGES/DRESSINGS) ×4 IMPLANT
ELECT PENCIL ROCKER SW 15FT (MISCELLANEOUS) ×4 IMPLANT
ELECT REM PT RETURN 15FT ADLT (MISCELLANEOUS) ×4 IMPLANT
GAUZE SPONGE 4X4 12PLY STRL (GAUZE/BANDAGES/DRESSINGS) IMPLANT
GLOVE BIO SURGEON STRL SZ 6.5 (GLOVE) ×6 IMPLANT
GLOVE BIO SURGEONS STRL SZ 6.5 (GLOVE) ×2
GLOVE BIOGEL PI IND STRL 7.0 (GLOVE) ×2 IMPLANT
GLOVE BIOGEL PI INDICATOR 7.0 (GLOVE) ×2
GOWN STRL REUS W/TWL 2XL LVL3 (GOWN DISPOSABLE) ×8 IMPLANT
GOWN STRL REUS W/TWL XL LVL3 (GOWN DISPOSABLE) ×16 IMPLANT
HANDLE SUCTION POOLE (INSTRUMENTS) IMPLANT
IRRIG SUCT STRYKERFLOW 2 WTIP (MISCELLANEOUS) ×4
IRRIGATION SUCT STRKRFLW 2 WTP (MISCELLANEOUS) ×2 IMPLANT
LIGASURE IMPACT 36 18CM CVD LR (INSTRUMENTS) ×4 IMPLANT
PACK COLON (CUSTOM PROCEDURE TRAY) ×4 IMPLANT
RELOAD PROXIMATE 75MM BLUE (ENDOMECHANICALS) ×8 IMPLANT
RTRCTR WOUND ALEXIS 18CM MED (MISCELLANEOUS) ×4
SEALER TISSUE G2 STRG ARTC 35C (ENDOMECHANICALS) IMPLANT
SEALER TISSUE X1 CVD JAW (INSTRUMENTS) IMPLANT
SLEEVE XCEL OPT CAN 5 100 (ENDOMECHANICALS) ×4 IMPLANT
SPONGE LAP 18X18 X RAY DECT (DISPOSABLE) IMPLANT
STAPLER GUN LINEAR PROX 60 (STAPLE) ×4 IMPLANT
STAPLER PROXIMATE 75MM BLUE (STAPLE) ×4 IMPLANT
STAPLER VISISTAT 35W (STAPLE) IMPLANT
SUCTION POOLE HANDLE (INSTRUMENTS)
SUT NOVA 1 T20/GS 25DT (SUTURE) IMPLANT
SUT NOVA NAB DX-16 0-1 5-0 T12 (SUTURE) ×12 IMPLANT
SUT PDS AB 1 TP1 96 (SUTURE) IMPLANT
SUT PROLENE 2 0 KS (SUTURE) IMPLANT
SUT PROLENE 2 0 SH DA (SUTURE) IMPLANT
SUT SILK 2 0 (SUTURE) ×2
SUT SILK 2 0 SH CR/8 (SUTURE) ×4 IMPLANT
SUT SILK 2-0 18XBRD TIE 12 (SUTURE) ×2 IMPLANT
SUT SILK 3 0 (SUTURE) ×2
SUT SILK 3 0 SH CR/8 (SUTURE) ×4 IMPLANT
SUT SILK 3-0 18XBRD TIE 12 (SUTURE) ×2 IMPLANT
SUT VIC AB 2-0 SH 18 (SUTURE) ×4 IMPLANT
SUT VIC AB 4-0 PS2 27 (SUTURE) ×4 IMPLANT
SUT VICRYL 0 UR6 27IN ABS (SUTURE) ×4 IMPLANT
TOWEL OR 17X26 10 PK STRL BLUE (TOWEL DISPOSABLE) IMPLANT
TOWEL OR NON WOVEN STRL DISP B (DISPOSABLE) ×4 IMPLANT
TRAY FOLEY CATH 14FRSI W/METER (CATHETERS) ×4 IMPLANT
TROCAR BLADELESS OPT 5 100 (ENDOMECHANICALS) ×12 IMPLANT
TROCAR XCEL BLUNT TIP 100MML (ENDOMECHANICALS) ×4 IMPLANT
TROCAR XCEL NON-BLD 11X100MML (ENDOMECHANICALS) IMPLANT

## 2017-11-02 NOTE — H&P (Signed)
CC: abd pain  HPI: Patient presented to the office with abdominal pain.  CT was ordered and showed a loop of inflamed small bowel.  Pt has partial obstructive symptoms.  I recommended resection with diagnostic laparoscopy.  Past Medical History:  Diagnosis Date  . Anal polyp 2005   condyloma acuminatum  . CKD (chronic kidney disease)    Cr 1.9 -09/2014  . Colonic mass   . Diverticulitis    with abscess  . Hypertension    Past Surgical History:  Procedure Laterality Date  . anal polypectomy  01/02/2004   Dr.Rosenbower- anal polyp removed. bx= condyloma acuminatum  . COLONOSCOPY  04/08/2003   Dr.Mann- small nonbleeding internal and external hemorrhoids small polypoid mass at anal verge, normal appearing L colon, transverse colon, R colon including cecum. stenotic ileocecal valve. bx of maa= benign rectal type mucosa with features of mucosal prolapse syndrome  . COLONOSCOPY N/A 04/02/2015   Dr.Rourk- colonic diverticulosis, no evidence of colonic neoplasm. abnormal ileocecal valve with ulceration bx= ulcerated/eroded benign ileocecal valve mucosa  . FLEXIBLE SIGMOIDOSCOPY  11/06/2003   Dr.Mann- small nodular mass at anal verge, bx done. early L side diverticula. bx= benign rectal mucosa with features of mucosal prolapse syndrome. no adenomatous changes or evidence of malignancy identified.   [facility]     Social History   Socioeconomic History  . Marital status: Widowed    Spouse name: Not on file  . Number of children: Not on file  . Years of education: Not on file  . Highest education level: Not on file  Social Needs  . Financial resource strain: Not on file  . Food insecurity - worry: Not on file  . Food insecurity - inability: Not on file  . Transportation needs - medical: Not on file  . Transportation needs - non-medical: Not on file  Occupational History  . Not on file  Tobacco Use  . Smoking status: Former Smoker    Years: 5.00    Types: Cigarettes  .  Smokeless tobacco: Never Used  . Tobacco comment: 1 pack cigarettes per week ; quit in her early 70s   Substance and Sexual Activity  . Alcohol use: No  . Drug use: Yes  . Sexual activity: Not on file  Other Topics Concern  . Not on file  Social History Narrative  . Not on file   Family History  Problem Relation Age of Onset  . Hypertension Mother   . Heart disease Father   . Cancer Sister        breast  . Colon cancer Neg Hx    Current Meds  Medication Sig  . aspirin EC 81 MG tablet Take 81 mg by mouth at bedtime.   . Calcium Carbonate-Vitamin D (CALTRATE 600+D) 600-400 MG-UNIT per tablet Take 1 tablet by mouth 2 (two) times daily.  . folic acid (FOLVITE) 027 MCG tablet Take 800 mcg by mouth daily.   . hydrochlorothiazide (HYDRODIURIL) 25 MG tablet Take one tablet qam (Patient taking differently: Take 25 mg by mouth every morning. Take one tablet qam)  . Multiple Vitamins-Minerals (MULTIVITAMIN WITH MINERALS) tablet Take 1 tablet by mouth daily.  . verapamil (CALAN-SR) 180 MG CR tablet Take one tablet at bedtime (Patient taking differently: Take 180 mg by mouth at bedtime. Take one tablet at bedtime)  No Known Allergies  Review of Systems - General ROS: negative for - chills or fever Hematological and Lymphatic ROS: negative for - bleeding problems or bruising  Respiratory ROS: no cough, shortness of breath, or wheezing Cardiovascular ROS: no chest pain or dyspnea on exertion Gastrointestinal ROS: positive for - abdominal pain negative for - diarrhea or nausea/vomiting Genito-Urinary ROS: no dysuria, trouble voiding, or hematuria   BP 140/82   Pulse 83   Temp 98.4 F (36.9 C) (Oral)   Resp 18   Ht 5' 6"  (1.676 m)   Wt 64.9 kg (143 lb)   LMP  (LMP Unknown)   SpO2 97%   BMI 23.08 kg/m    Exam Physical Exam  Constitutional: She is oriented to person, place, and time. She appears well-developed and well-nourished.  HENT:  Head: Normocephalic and atraumatic.  Eyes:  EOM are normal. Pupils are equal, round, and reactive to light.  Neck: Normal range of motion. Neck supple.  Cardiovascular: Normal rate and regular rhythm.  Pulmonary/Chest: Effort normal. No respiratory distress.  Abdominal: Soft. She exhibits no distension. There is no tenderness.  Musculoskeletal: Normal range of motion.  Neurological: She is alert and oriented to person, place, and time.  Skin: Skin is warm and dry.    Assessment   Weight loss, abnormal imaging  Plan:  To OR for diagnostic laparoscopy.  Will attempt small bowel resection if feasible.  Risks include bleeding, infection, damage to adjacent structures, recurrence and hernia.  I believe she understands this and agrees to proceed.  Rosario Adie, MD  [facility]

## 2017-11-02 NOTE — Anesthesia Procedure Notes (Signed)
Procedure Name: Intubation Date/Time: 11/02/2017 8:32 AM Performed by: Gwyndolyn Saxon, CRNA Pre-anesthesia Checklist: Patient identified, Emergency Drugs available, Suction available, Patient being monitored and Timeout performed Patient Re-evaluated:Patient Re-evaluated prior to induction Oxygen Delivery Method: Circle system utilized Induction Type: IV induction Ventilation: Mask ventilation without difficulty Grade View: Grade I Tube type: Oral Tube size: 7.0 mm Number of attempts: 1 Placement Confirmation: ETT inserted through vocal cords under direct vision,  positive ETCO2,  CO2 detector and breath sounds checked- equal and bilateral Secured at: 21 cm Tube secured with: Tape Dental Injury: Teeth and Oropharynx as per pre-operative assessment

## 2017-11-02 NOTE — Progress Notes (Signed)
   11/02/17 1400  PT Visit Information  Last PT Received On 11/02/17  Reason Eval/Treat Not Completed Patient not medically ready (per RN)

## 2017-11-02 NOTE — Anesthesia Preprocedure Evaluation (Signed)
Anesthesia Evaluation  Patient identified by MRN, date of birth, ID band Patient awake    Reviewed: Allergy & Precautions, NPO status , Patient's Chart, lab work & pertinent test results  Airway Mallampati: II  TM Distance: >3 FB Neck ROM: Full    Dental no notable dental hx.    Pulmonary neg pulmonary ROS, former smoker,    Pulmonary exam normal breath sounds clear to auscultation       Cardiovascular hypertension, Normal cardiovascular exam Rhythm:Regular Rate:Normal     Neuro/Psych negative neurological ROS  negative psych ROS   GI/Hepatic negative GI ROS, Neg liver ROS,   Endo/Other  negative endocrine ROS  Renal/GU Renal InsufficiencyRenal disease  negative genitourinary   Musculoskeletal negative musculoskeletal ROS (+)   Abdominal   Peds negative pediatric ROS (+)  Hematology negative hematology ROS (+)   Anesthesia Other Findings   Reproductive/Obstetrics negative OB ROS                             Anesthesia Physical Anesthesia Plan  ASA: II  Anesthesia Plan: General   Post-op Pain Management:    Induction: Intravenous  PONV Risk Score and Plan: 2 and Ondansetron, Dexamethasone and Treatment may vary due to age or medical condition  Airway Management Planned: Oral ETT  Additional Equipment:   Intra-op Plan:   Post-operative Plan: Extubation in OR  Informed Consent: I have reviewed the patients History and Physical, chart, labs and discussed the procedure including the risks, benefits and alternatives for the proposed anesthesia with the patient or authorized representative who has indicated his/her understanding and acceptance.   Dental advisory given  Plan Discussed with: CRNA and Surgeon  Anesthesia Plan Comments:         Anesthesia Quick Evaluation

## 2017-11-02 NOTE — Anesthesia Postprocedure Evaluation (Signed)
Anesthesia Post Note  Patient: Christine Newman  Procedure(s) Performed: LAPAROSCOPIC ILEOCECECTOMY (N/A Abdomen)     Patient location during evaluation: PACU Anesthesia Type: General Level of consciousness: awake and alert Pain management: pain level controlled Vital Signs Assessment: post-procedure vital signs reviewed and stable Respiratory status: spontaneous breathing, nonlabored ventilation, respiratory function stable and patient connected to nasal cannula oxygen Cardiovascular status: blood pressure returned to baseline and stable Postop Assessment: no apparent nausea or vomiting Anesthetic complications: no    Last Vitals:  Vitals:   11/02/17 1100 11/02/17 1102  BP: (!) 151/76   Pulse: 65 65  Resp: (!) 23 (!) 21  Temp:    SpO2: 100% 100%    Last Pain:  Vitals:   11/02/17 1102  TempSrc:   PainSc: 6                  Desmund Elman S

## 2017-11-02 NOTE — Op Note (Signed)
11/02/2017  10:05 AM  PATIENT:  Christine Newman  77 y.o. female  Patient Care Team: Mikey Kirschner, MD as PCP - General (Family Medicine)  PRE-OPERATIVE DIAGNOSIS:  small bowel inflammation  POST-OPERATIVE DIAGNOSIS:  small bowel inflammation  PROCEDURE:  LAPAROSCOPIC ILEOCECECTOMY   Surgeon(s): Leighton Ruff, MD Ileana Roup, MD  ASSISTANT: Dr Dema Severin   ANESTHESIA:   local and general  EBL: 10 ml  Total I/O In: -  Out: 20 [Urine:10; Blood:10]  DRAINS: none   SPECIMEN:  Source of Specimen:  terminal ileum and small bowel  DISPOSITION OF SPECIMEN:  PATHOLOGY  COUNTS:  YES  PLAN OF CARE: Admit to inpatient   PATIENT DISPOSITION:  PACU - hemodynamically stable.  INDICATION: 77 year old female with obstructive symptoms.  CT showed localized small bowel inflammation   OR FINDINGS: chronic inflammation of the terminal ileum with creeping fat, chronic appearing obstruction  DESCRIPTION: the patient was identified in the preoperative holding area and taken to the OR where they were laid supine on the operating room table.  General anesthesia was induced without difficulty. SCDs were also noted to be in place prior to the initiation of anesthesia.  The patient was then prepped and draped in the usual sterile fashion.   A surgical timeout was performed indicating the correct patient, procedure, positioning and need for preoperative antibiotics.   I began by making an infraumbilical incision using a 15 blade scalpel.  Dissection was carried down through the subcutaneous tissues to the level of the fascia.  The fascia was then incised at midline after being elevated with 2 Coker clamps.  A stay suture was placed and the [facility] port was placed inside the abdomen.  The abdomen insufflated to approximately 15 mmHg.  A diagnostic laparoscopy was performed all 4 quadrants of the abdomen were inspected.  The patient had what appeared to be chronic inflammation of the ileum  that was adherent to the sigmoid colon.  The sigmoid colon appeared normal and without inflammation.  The proximal small bowel was dilated and consistent with chronic partial obstruction.  I decided to enlarge the incision to evaluate this more closely as it was difficult to dissect laparoscopically.  I enlarged this incision inferiorly and placed an Lynn wound protector.  The small bowel was adherent to the sigmoid colon.  This was broken apart bluntly.  The mesenteric adhesions were also cleared.  This allowed me to elevate the sigmoid colon out of the abdominal wound.  I shaved a portion of the tissue using a 10 blade scalpel.  This was sent to frozen pathology to evaluate for any malignancy.  Pathology returned as inflammatory fibrosis only.  I indicated the area along the sigmoid colon using 2-0 silk sutures.  This was then placed back into the abdomen and the small bowel was brought out of the abdominal wound.  I ran the small bowel proximally to the level of the ligament of Treitz.  There were no other strictures except for one just proximal to the area of inflammation.  I divided the small bowel proximal to this area using a GIA blue load stapler.  The terminal ileum appeared mildly inflamed with creeping fat and therefore I decided to perform an ileocecectomy.  The cecum was elevated out of the wound and mobilized.  I then transected the descending colon just distal to the cecum using a 75 mm blue load stapler.  The mesentery was dissected using a LigaSure device.  Hemostasis was good.  The  specimen was sent to pathology for further examination.  I then oriented the small bowel to the descending colon and perform an anastomosis using a GIA blue load stapler.  The common enterotomy channel was closed using a TA blue load 60 mm stapler.  The mesenteric defect was closed using a running 2-0 silk suture.  The ends of the staple line were imbricated using interrupted 3-0 silk sutures.  This was then placed  back into the abdomen.  The fascia was closed using #1 Novafil interrupted sutures.  The abdomen was then reinsufflated.  An additional 5 mm port was placed at midline.  The camera inspection revealed good hemostasis.  The abdomen was irrigated.  I then performed a tapp block using Exparel bilaterally.  This was done under laparoscopic visualization.  The ports were removed and the abdomen was desufflated.  The subcutaneous tissue was reapproximated at midline using 2-0 Vicryl sutures.  The skin was closed using a running 4-0 subcuticular suture.  The port sites were also closed using 4-0 Vicryl suture and Dermabond.  Dressing was placed on the midline wound.  The patient was then awakened from anesthesia and sent to the postanesthesia care unit in stable condition.  All counts were correct per operating room staff.

## 2017-11-02 NOTE — Transfer of Care (Signed)
Immediate Anesthesia Transfer of Care Note  Patient: Karlton Lemon  Procedure(s) Performed: LAPAROSCOPIC ILEOCECECTOMY (N/A Abdomen)  Patient Location: PACU  Anesthesia Type:General  Level of Consciousness: drowsy and patient cooperative  Airway & Oxygen Therapy: Patient Spontanous Breathing and Patient connected to face mask oxygen  Post-op Assessment: Report given to RN and Post -op Vital signs reviewed and stable  Post vital signs: Reviewed and stable  Last Vitals:  Vitals:   11/02/17 0648  BP: 140/82  Pulse: 83  Resp: 18  Temp: 36.9 C  SpO2: 97%    Last Pain:  Vitals:   11/02/17 0648  TempSrc: Oral      Patients Stated Pain Goal: 3 (09/38/18 2993)  Complications: No apparent anesthesia complications

## 2017-11-03 ENCOUNTER — Encounter (HOSPITAL_COMMUNITY): Payer: Self-pay | Admitting: General Surgery

## 2017-11-03 LAB — BASIC METABOLIC PANEL
ANION GAP: 9 (ref 5–15)
BUN: 16 mg/dL (ref 6–20)
CALCIUM: 8.8 mg/dL — AB (ref 8.9–10.3)
CO2: 24 mmol/L (ref 22–32)
Chloride: 102 mmol/L (ref 101–111)
Creatinine, Ser: 1.49 mg/dL — ABNORMAL HIGH (ref 0.44–1.00)
GFR, EST AFRICAN AMERICAN: 38 mL/min — AB (ref >60)
GFR, EST NON AFRICAN AMERICAN: 33 mL/min — AB (ref >60)
Glucose, Bld: 119 mg/dL — ABNORMAL HIGH (ref 65–99)
POTASSIUM: 4.5 mmol/L (ref 3.5–5.1)
SODIUM: 135 mmol/L (ref 135–145)

## 2017-11-03 LAB — CBC
HCT: 37.6 % (ref 36.0–46.0)
HEMOGLOBIN: 12 g/dL (ref 12.0–15.0)
MCH: 29 pg (ref 26.0–34.0)
MCHC: 31.9 g/dL (ref 30.0–36.0)
MCV: 90.8 fL (ref 78.0–100.0)
Platelets: 266 10*3/uL (ref 150–400)
RBC: 4.14 MIL/uL (ref 3.87–5.11)
RDW: 14 % (ref 11.5–15.5)
WBC: 12.2 10*3/uL — ABNORMAL HIGH (ref 4.0–10.5)

## 2017-11-03 NOTE — Progress Notes (Signed)
   11/03/17 1500  PT Visit Information  Last PT Received On 11/03/17  Reason Eval/Treat Not Completed PT screened, no needs identified, will sign off (reports amb in hallway w/o difficulty)

## 2017-11-03 NOTE — Progress Notes (Signed)
1 Day Post-Op lap ileocecectomy  Subjective: Doing well, min pain, no nausea, no flatus, ambulated in hall  Objective: Vital signs in last 24 hours: Temp:  [97.6 F (36.4 C)-99.3 F (37.4 C)] 98.3 F (36.8 C) (01/24 0520) Pulse Rate:  [65-80] 76 (01/24 0520) Resp:  [15-26] 16 (01/24 0520) BP: (112-164)/(70-92) 112/77 (01/24 0520) SpO2:  [96 %-100 %] 99 % (01/24 0520)   Intake/Output from previous day: 01/23 0701 - 01/24 0700 In: 2102.5 [I.V.:2102.5] Out: 690 [Urine:680; Blood:10] Intake/Output this shift: No intake/output data recorded.   General appearance: alert and cooperative GI: normal findings: soft, non-distended  Incision: no significant drainage  Lab Results:  Recent Labs    11/03/17 0434  WBC 12.2*  HGB 12.0  HCT 37.6  PLT 266   BMET Recent Labs    11/03/17 0434  NA 135  K 4.5  CL 102  CO2 24  GLUCOSE 119*  BUN 16  CREATININE 1.49*  CALCIUM 8.8*   PT/INR No results for input(s): LABPROT, INR in the last 72 hours. ABG No results for input(s): PHART, HCO3 in the last 72 hours.  Invalid input(s): PCO2, PO2  MEDS, Scheduled . acetaminophen  1,000 mg Oral Q6H  . alvimopan  12 mg Oral BID  . enoxaparin (LOVENOX) injection  40 mg Subcutaneous Q24H  . hydrochlorothiazide  25 mg Oral Daily  . verapamil  180 mg Oral QHS    Studies/Results: No results found.  Assessment: s/p Procedure(s): LAPAROSCOPIC ILEOCECECTOMY Patient Active Problem List   Diagnosis Date Noted  . SBO (small bowel obstruction) ([place]) 11/02/2017  . Generalized anxiety disorder 09/27/2015  . Diverticulosis of colon without hemorrhage   . Abnormal computed tomography of cecum and terminal ileum   . Colonic mass 01/16/2015  . CKD (chronic kidney disease) stage 3, GFR 30-59 ml/min ([facility]) 01/14/2015  . AKI (acute kidney injury) ([place]) 01/14/2015  . Hypomagnesemia 01/14/2015  . Diverticulitis of large intestine with abscess without bleeding   . Diverticulitis of intestine  with abscess 01/13/2015  . Hypokalemia 01/13/2015  . UTI (lower urinary tract infection)   . Benign paroxysmal positional vertigo 08/27/2013  . Essential hypertension, benign 03/11/2013  . Renal insufficiency 03/11/2013    Expected post op course  Plan: d/c foley  Clears as tolerated Ambulate    LOS: 1 day     .Rosario Adie, [facility], [place]   11/03/2017 7:50 AM

## 2017-11-04 LAB — BASIC METABOLIC PANEL
Anion gap: 9 (ref 5–15)
BUN: 18 mg/dL (ref 6–20)
CO2: 27 mmol/L (ref 22–32)
CREATININE: 1.4 mg/dL — AB (ref 0.44–1.00)
Calcium: 8.9 mg/dL (ref 8.9–10.3)
Chloride: 97 mmol/L — ABNORMAL LOW (ref 101–111)
GFR calc non Af Amer: 36 mL/min — ABNORMAL LOW (ref >60)
GFR, EST AFRICAN AMERICAN: 41 mL/min — AB (ref >60)
GLUCOSE: 123 mg/dL — AB (ref 65–99)
Potassium: 3.9 mmol/L (ref 3.5–5.1)
Sodium: 133 mmol/L — ABNORMAL LOW (ref 135–145)

## 2017-11-04 LAB — CBC
HCT: 39.1 % (ref 36.0–46.0)
Hemoglobin: 12.4 g/dL (ref 12.0–15.0)
MCH: 28.7 pg (ref 26.0–34.0)
MCHC: 31.7 g/dL (ref 30.0–36.0)
MCV: 90.5 fL (ref 78.0–100.0)
PLATELETS: 312 10*3/uL (ref 150–400)
RBC: 4.32 MIL/uL (ref 3.87–5.11)
RDW: 14.1 % (ref 11.5–15.5)
WBC: 15.5 10*3/uL — ABNORMAL HIGH (ref 4.0–10.5)

## 2017-11-04 MED ORDER — TRAMADOL HCL 50 MG PO TABS: 50.0000 mg | ORAL_TABLET | Freq: Four times a day (QID) | ORAL | Status: DC | PRN

## 2017-11-04 NOTE — Progress Notes (Signed)
2 Days Post-Op lap ileocecectomy  Subjective: Doing well, min pain, some nausea last night, min flatus, no BM yet, ambulated in hall yesterday  Objective: Vital signs in last 24 hours: Temp:  [98 F (36.7 C)-98.9 F (37.2 C)] 98 F (36.7 C) (01/25 0509) Pulse Rate:  [83-90] 85 (01/25 0509) Resp:  [16-18] 18 (01/25 0509) BP: (109-119)/(67-77) 110/67 (01/25 0509) SpO2:  [96 %-97 %] 97 % (01/25 0509)   Intake/Output from previous day: 01/24 0701 - 01/25 0700 In: 2831.3 [P.O.:1040; I.V.:1791.3] Out: 800 [Urine:800] Intake/Output this shift: No intake/output data recorded.   General appearance: alert and cooperative GI: normal findings: soft, non-distended  Incision: no significant drainage  Lab Results:  Recent Labs    11/03/17 0434 11/04/17 0453  WBC 12.2* 15.5*  HGB 12.0 12.4  HCT 37.6 39.1  PLT 266 312   BMET Recent Labs    11/03/17 0434 11/04/17 0453  NA 135 133*  K 4.5 3.9  CL 102 97*  CO2 24 27  GLUCOSE 119* 123*  BUN 16 18  CREATININE 1.49* 1.40*  CALCIUM 8.8* 8.9   PT/INR No results for input(s): LABPROT, INR in the last 72 hours. ABG No results for input(s): PHART, HCO3 in the last 72 hours.  Invalid input(s): PCO2, PO2  MEDS, Scheduled . acetaminophen  1,000 mg Oral Q6H  . alvimopan  12 mg Oral BID  . enoxaparin (LOVENOX) injection  40 mg Subcutaneous Q24H  . hydrochlorothiazide  25 mg Oral Daily  . verapamil  180 mg Oral QHS    Studies/Results: No results found.  Assessment: s/p Procedure(s): LAPAROSCOPIC ILEOCECECTOMY Patient Active Problem List   Diagnosis Date Noted  . SBO (small bowel obstruction) ([place]) 11/02/2017  . Generalized anxiety disorder 09/27/2015  . Diverticulosis of colon without hemorrhage   . Abnormal computed tomography of cecum and terminal ileum   . Colonic mass 01/16/2015  . CKD (chronic kidney disease) stage 3, GFR 30-59 ml/min (HCC) 01/14/2015  . AKI (acute kidney injury) ([place]) 01/14/2015  .  Hypomagnesemia 01/14/2015  . Diverticulitis of large intestine with abscess without bleeding   . Diverticulitis of intestine with abscess 01/13/2015  . Hypokalemia 01/13/2015  . UTI (lower urinary tract infection)   . Benign paroxysmal positional vertigo 08/27/2013  . Essential hypertension, benign 03/11/2013  . Renal insufficiency 03/11/2013    Expected post op course  Plan: Fulls as tolerated, can increase to soft diet if she has a BM Ambulate  PO pain meds if needed  LOS: 2 days     .Rosario Adie, [facility], [place] [phone]   11/04/2017 10:31 AM

## 2017-11-05 LAB — BASIC METABOLIC PANEL
ANION GAP: 4 — AB (ref 5–15)
BUN: 16 mg/dL (ref 6–20)
CHLORIDE: 99 mmol/L — AB (ref 101–111)
CO2: 30 mmol/L (ref 22–32)
Calcium: 8.3 mg/dL — ABNORMAL LOW (ref 8.9–10.3)
Creatinine, Ser: 1.43 mg/dL — ABNORMAL HIGH (ref 0.44–1.00)
GFR calc non Af Amer: 35 mL/min — ABNORMAL LOW (ref >60)
GFR, EST AFRICAN AMERICAN: 40 mL/min — AB (ref >60)
GLUCOSE: 97 mg/dL (ref 65–99)
POTASSIUM: 3.9 mmol/L (ref 3.5–5.1)
Sodium: 133 mmol/L — ABNORMAL LOW (ref 135–145)

## 2017-11-05 LAB — CBC
HEMATOCRIT: 35.8 % — AB (ref 36.0–46.0)
HEMOGLOBIN: 11.5 g/dL — AB (ref 12.0–15.0)
MCH: 28.9 pg (ref 26.0–34.0)
MCHC: 32.1 g/dL (ref 30.0–36.0)
MCV: 89.9 fL (ref 78.0–100.0)
Platelets: 288 10*3/uL (ref 150–400)
RBC: 3.98 MIL/uL (ref 3.87–5.11)
RDW: 14.1 % (ref 11.5–15.5)
WBC: 10.8 10*3/uL — ABNORMAL HIGH (ref 4.0–10.5)

## 2017-11-05 MED ORDER — MESALAMINE 400 MG PO CPDR: 800.0000 mg | DELAYED_RELEASE_CAPSULE | Freq: Three times a day (TID) | ORAL | 1 refills | Status: DC

## 2017-11-05 MED ORDER — MESALAMINE 400 MG PO CPDR
800.0000 mg | DELAYED_RELEASE_CAPSULE | Freq: Three times a day (TID) | ORAL | Status: DC
  Administered 2017-11-05: 800 mg via ORAL
  Filled 2017-11-05 (×2): qty 2

## 2017-11-05 NOTE — Discharge Summary (Signed)
Patient ID: Christine Newman [id number] 77 y.o. Jun 14, 1941  11/02/2017  Discharge date and time: No discharge date for patient encounter.  Admitting Physician: Rosario Adie  Discharge Physician: Rosario Adie.  Admission Diagnoses: small bowel inflammation  Discharge Diagnoses: Crohn's disease  Operations:  LAPAROSCOPIC ILEOCECECTOMY    Discharged Condition: good    Hospital Course: Pt admitted after surgery.  Foley was removed on POD 1.  Her diet was advanced as tolerated.  By POD 3 she was tolerating a diet and pain was controlled with tylenol.  Her pathology revealed Crohn's disease and she was started on Asachol at discharge.    Consults: None  Significant Diagnostic Studies: labs: cbc, chemistry  Treatments: IV hydration, analgesia: acetaminophen and surgery: lap ileocecectomy   Disposition: Home

## 2017-11-05 NOTE — Discharge Instructions (Signed)

## 2017-11-05 NOTE — Progress Notes (Signed)
Discharge instructions reviewed with patient. All questions answered. Patient wheeled to vehicle with belongings by nurse tech.

## 2017-11-05 NOTE — Care Management Important Message (Signed)
Important Message  Patient Details  Name: Christine Newman MRN: [id number] Date of Birth: 1941/09/08   Medicare Important Message Given:  Yes    Erenest Rasher, RN 11/05/2017, 11:44 AM

## 2017-11-21 ENCOUNTER — Encounter: Payer: Self-pay | Admitting: Internal Medicine

## 2018-01-05 ENCOUNTER — Other Ambulatory Visit: Payer: Self-pay

## 2018-01-05 ENCOUNTER — Inpatient Hospital Stay (HOSPITAL_COMMUNITY)
Admission: EM | Admit: 2018-01-05 | Discharge: 2018-01-08 | DRG: 872 | Disposition: A | Discharge status: Home / Self Care | Payer: Medicare Other | Attending: Internal Medicine | Admitting: Internal Medicine

## 2018-01-05 ENCOUNTER — Encounter (HOSPITAL_COMMUNITY): Payer: Self-pay | Admitting: Emergency Medicine

## 2018-01-05 ENCOUNTER — Emergency Department (HOSPITAL_COMMUNITY): Payer: Medicare Other

## 2018-01-05 ENCOUNTER — Observation Stay (HOSPITAL_COMMUNITY): Payer: Medicare Other

## 2018-01-05 DIAGNOSIS — Z809 Family history of malignant neoplasm, unspecified: Secondary | ICD-10-CM

## 2018-01-05 DIAGNOSIS — A419 Sepsis, unspecified organism: Secondary | ICD-10-CM

## 2018-01-05 DIAGNOSIS — K5090 Crohn's disease, unspecified, without complications: Secondary | ICD-10-CM | POA: Diagnosis present

## 2018-01-05 DIAGNOSIS — N183 Chronic kidney disease, stage 3 unspecified: Secondary | ICD-10-CM | POA: Diagnosis present

## 2018-01-05 DIAGNOSIS — I10 Essential (primary) hypertension: Secondary | ICD-10-CM | POA: Diagnosis present

## 2018-01-05 DIAGNOSIS — F411 Generalized anxiety disorder: Secondary | ICD-10-CM | POA: Diagnosis present

## 2018-01-05 DIAGNOSIS — I129 Hypertensive chronic kidney disease with stage 1 through stage 4 chronic kidney disease, or unspecified chronic kidney disease: Secondary | ICD-10-CM | POA: Diagnosis present

## 2018-01-05 DIAGNOSIS — N10 Acute tubulo-interstitial nephritis: Secondary | ICD-10-CM | POA: Insufficient documentation

## 2018-01-05 DIAGNOSIS — N200 Calculus of kidney: Secondary | ICD-10-CM | POA: Diagnosis not present

## 2018-01-05 DIAGNOSIS — Z87891 Personal history of nicotine dependence: Secondary | ICD-10-CM

## 2018-01-05 DIAGNOSIS — N23 Unspecified renal colic: Secondary | ICD-10-CM | POA: Diagnosis not present

## 2018-01-05 DIAGNOSIS — Z79899 Other long term (current) drug therapy: Secondary | ICD-10-CM

## 2018-01-05 DIAGNOSIS — Z8249 Family history of ischemic heart disease and other diseases of the circulatory system: Secondary | ICD-10-CM

## 2018-01-05 DIAGNOSIS — Z8719 Personal history of other diseases of the digestive system: Secondary | ICD-10-CM

## 2018-01-05 DIAGNOSIS — Z7982 Long term (current) use of aspirin: Secondary | ICD-10-CM

## 2018-01-05 DIAGNOSIS — N136 Pyonephrosis: Secondary | ICD-10-CM | POA: Diagnosis present

## 2018-01-05 DIAGNOSIS — E876 Hypokalemia: Secondary | ICD-10-CM | POA: Diagnosis not present

## 2018-01-05 DIAGNOSIS — Z87442 Personal history of urinary calculi: Secondary | ICD-10-CM

## 2018-01-05 DIAGNOSIS — N132 Hydronephrosis with renal and ureteral calculous obstruction: Secondary | ICD-10-CM | POA: Diagnosis present

## 2018-01-05 HISTORY — PX: IR NEPHROSTOMY PLACEMENT LEFT: IMG6063

## 2018-01-05 LAB — BASIC METABOLIC PANEL
ANION GAP: 11 (ref 5–15)
BUN: 20 mg/dL (ref 6–20)
CALCIUM: 9.3 mg/dL (ref 8.9–10.3)
CO2: 25 mmol/L (ref 22–32)
CREATININE: 1.58 mg/dL — AB (ref 0.44–1.00)
Chloride: 99 mmol/L — ABNORMAL LOW (ref 101–111)
GFR, EST AFRICAN AMERICAN: 36 mL/min — AB (ref >60)
GFR, EST NON AFRICAN AMERICAN: 31 mL/min — AB (ref >60)
Glucose, Bld: 109 mg/dL — ABNORMAL HIGH (ref 65–99)
Potassium: 3.6 mmol/L (ref 3.5–5.1)
SODIUM: 135 mmol/L (ref 135–145)

## 2018-01-05 LAB — CBC WITH DIFFERENTIAL/PLATELET
BASOS ABS: 0 10*3/uL (ref 0.0–0.1)
BASOS PCT: 0 %
Eosinophils Absolute: 0 10*3/uL (ref 0.0–0.7)
Eosinophils Relative: 0 %
HCT: 39.1 % (ref 36.0–46.0)
HEMOGLOBIN: 12.1 g/dL (ref 12.0–15.0)
Lymphocytes Relative: 5 %
Lymphs Abs: 0.7 10*3/uL (ref 0.7–4.0)
MCH: 29.2 pg (ref 26.0–34.0)
MCHC: 30.9 g/dL (ref 30.0–36.0)
MCV: 94.4 fL (ref 78.0–100.0)
MONOS PCT: 6 %
Monocytes Absolute: 0.7 10*3/uL (ref 0.1–1.0)
NEUTROS ABS: 10.8 10*3/uL — AB (ref 1.7–7.7)
NEUTROS PCT: 89 %
Platelets: 268 10*3/uL (ref 150–400)
RBC: 4.14 MIL/uL (ref 3.87–5.11)
RDW: 14.9 % (ref 11.5–15.5)
WBC: 12.1 10*3/uL — ABNORMAL HIGH (ref 4.0–10.5)

## 2018-01-05 LAB — URINALYSIS, ROUTINE W REFLEX MICROSCOPIC
BILIRUBIN URINE: NEGATIVE
Glucose, UA: NEGATIVE mg/dL
KETONES UR: NEGATIVE mg/dL
Nitrite: NEGATIVE
PROTEIN: NEGATIVE mg/dL
Specific Gravity, Urine: 1.018 (ref 1.005–1.030)
pH: 5 (ref 5.0–8.0)

## 2018-01-05 LAB — HEPATIC FUNCTION PANEL
ALBUMIN: 3.4 g/dL — AB (ref 3.5–5.0)
ALK PHOS: 62 U/L (ref 38–126)
ALT: 13 U/L — ABNORMAL LOW (ref 14–54)
AST: 20 U/L (ref 15–41)
Bilirubin, Direct: 0.1 mg/dL (ref 0.1–0.5)
Indirect Bilirubin: 0.9 mg/dL (ref 0.3–0.9)
Total Bilirubin: 1 mg/dL (ref 0.3–1.2)
Total Protein: 7.1 g/dL (ref 6.5–8.1)

## 2018-01-05 LAB — PROTIME-INR
INR: 1.06
Prothrombin Time: 13.7 seconds (ref 11.4–15.2)

## 2018-01-05 MED ORDER — IOPAMIDOL (ISOVUE-300) INJECTION 61%: 50.0000 mL | Freq: Once | INTRAVENOUS | Status: DC | PRN

## 2018-01-05 MED ORDER — ONDANSETRON HCL 4 MG/2ML IJ SOLN
INTRAMUSCULAR | Status: AC
  Administered 2018-01-05: 4 mg via INTRAVENOUS
  Filled 2018-01-05: qty 2

## 2018-01-05 MED ORDER — SODIUM CHLORIDE 0.9 % IV SOLN
INTRAVENOUS | Status: DC
  Administered 2018-01-05: 22:00:00 via INTRAVENOUS

## 2018-01-05 MED ORDER — SODIUM CHLORIDE 0.9 % IV SOLN
1.0000 g | Freq: Once | INTRAVENOUS | Status: AC
  Administered 2018-01-05: 1 g via INTRAVENOUS
  Filled 2018-01-05: qty 10

## 2018-01-05 MED ORDER — ONDANSETRON HCL 4 MG/2ML IJ SOLN: 4.0000 mg | Freq: Four times a day (QID) | INTRAMUSCULAR | Status: DC | PRN

## 2018-01-05 MED ORDER — CEFTRIAXONE SODIUM 2 G IJ SOLR
2.0000 g | INTRAMUSCULAR | Status: DC
  Administered 2018-01-06 – 2018-01-07 (×2): 2 g via INTRAVENOUS
  Filled 2018-01-05: qty 20
  Filled 2018-01-05 (×2): qty 2

## 2018-01-05 MED ORDER — FENTANYL CITRATE (PF) 100 MCG/2ML IJ SOLN
INTRAMUSCULAR | Status: AC
  Filled 2018-01-05: qty 4

## 2018-01-05 MED ORDER — IOPAMIDOL (ISOVUE-300) INJECTION 61%
INTRAVENOUS | Status: AC
  Filled 2018-01-05: qty 50

## 2018-01-05 MED ORDER — VERAPAMIL HCL ER 180 MG PO TBCR
180.0000 mg | EXTENDED_RELEASE_TABLET | Freq: Every day | ORAL | Status: DC
  Filled 2018-01-05: qty 1

## 2018-01-05 MED ORDER — ONDANSETRON HCL 4 MG PO TABS: 4.0000 mg | ORAL_TABLET | Freq: Four times a day (QID) | ORAL | Status: DC | PRN

## 2018-01-05 MED ORDER — MORPHINE SULFATE (PF) 4 MG/ML IV SOLN
4.0000 mg | Freq: Once | INTRAVENOUS | Status: AC
  Administered 2018-01-05: 4 mg via INTRAVENOUS
  Filled 2018-01-05: qty 1

## 2018-01-05 MED ORDER — ACETAMINOPHEN 325 MG PO TABS
650.0000 mg | ORAL_TABLET | Freq: Once | ORAL | Status: AC
  Administered 2018-01-05: 650 mg via ORAL
  Filled 2018-01-05: qty 2

## 2018-01-05 MED ORDER — MORPHINE SULFATE (PF) 2 MG/ML IV SOLN: 2.0000 mg | INTRAVENOUS | Status: DC | PRN

## 2018-01-05 MED ORDER — ONDANSETRON HCL 4 MG/2ML IJ SOLN
4.0000 mg | Freq: Once | INTRAMUSCULAR | Status: AC
  Administered 2018-01-05: 4 mg via INTRAVENOUS

## 2018-01-05 MED ORDER — MIDAZOLAM HCL 2 MG/2ML IJ SOLN
INTRAMUSCULAR | Status: AC | PRN
  Administered 2018-01-05: 1 mg via INTRAVENOUS
  Administered 2018-01-05: 0.5 mg via INTRAVENOUS

## 2018-01-05 MED ORDER — MIDAZOLAM HCL 2 MG/2ML IJ SOLN
INTRAMUSCULAR | Status: AC
  Filled 2018-01-05: qty 6

## 2018-01-05 MED ORDER — FENTANYL CITRATE (PF) 100 MCG/2ML IJ SOLN
INTRAMUSCULAR | Status: AC | PRN
  Administered 2018-01-05: 25 ug via INTRAVENOUS
  Administered 2018-01-05: 50 ug via INTRAVENOUS

## 2018-01-05 MED ORDER — CEFTRIAXONE SODIUM 1 G IJ SOLR
1.0000 g | Freq: Once | INTRAMUSCULAR | Status: AC
  Administered 2018-01-05: 1 g via INTRAVENOUS
  Filled 2018-01-05: qty 1

## 2018-01-05 MED ORDER — LIDOCAINE HCL 1 % IJ SOLN
INTRAMUSCULAR | Status: AC
  Filled 2018-01-05: qty 20

## 2018-01-05 MED ORDER — LIDOCAINE HCL 1 % IJ SOLN
INTRAMUSCULAR | Status: AC | PRN
  Administered 2018-01-05: 10 mL

## 2018-01-05 NOTE — ED Provider Notes (Addendum)
[facility] EMERGENCY DEPARTMENT Provider Note   CSN: [id number] Arrival date & time: 01/05/18  1342     History   Chief Complaint Chief Complaint  Patient presents with  . Flank Pain    HPI Christine Newman is a 77 y.o. female.  HPI planes of left flank pain gradual onset yesterday.  Feels like kidney stone she has had in the past.  No other associated symptoms no nausea or vomiting no fever.  Nothing makes pain better or worse.  No treatment prior to coming here.  No other associated symptoms  Past Medical History:  Diagnosis Date  . Anal polyp 2005   condyloma acuminatum  . CKD (chronic kidney disease)    Cr 1.9 -09/2014  . Colonic mass   . Diverticulitis    with abscess  . Hypertension     Patient Active Problem List   Diagnosis Date Noted  . SBO (small bowel obstruction) ([place]) 11/02/2017  . Generalized anxiety disorder 09/27/2015  . Diverticulosis of colon without hemorrhage   . Abnormal computed tomography of cecum and terminal ileum   . Colonic mass 01/16/2015  . CKD (chronic kidney disease) stage 3, GFR 30-59 ml/min (HCC) 01/14/2015  . AKI (acute kidney injury) ([employer]) 01/14/2015  . Hypomagnesemia 01/14/2015  . Diverticulitis of large intestine with abscess without bleeding   . Diverticulitis of intestine with abscess 01/13/2015  . Hypokalemia 01/13/2015  . UTI (lower urinary tract infection)   . Benign paroxysmal positional vertigo 08/27/2013  . Essential hypertension, benign 03/11/2013  . Renal insufficiency 03/11/2013    Past Surgical History:  Procedure Laterality Date  . anal polypectomy  01/02/2004   Dr.Rosenbower- anal polyp removed. bx= condyloma acuminatum  . COLONOSCOPY  04/08/2003   Dr.Mann- small nonbleeding internal and external hemorrhoids small polypoid mass at anal verge, normal appearing L colon, transverse colon, R colon including cecum. stenotic ileocecal valve. bx of maa= benign rectal type mucosa with features of mucosal prolapse  syndrome  . COLONOSCOPY N/A 04/02/2015   Dr.Rourk- colonic diverticulosis, no evidence of colonic neoplasm. abnormal ileocecal valve with ulceration bx= ulcerated/eroded benign ileocecal valve mucosa  . FLEXIBLE SIGMOIDOSCOPY  11/06/2003   Dr.Mann- small nodular mass at anal verge, bx done. early L side diverticula. bx= benign rectal mucosa with features of mucosal prolapse syndrome. no adenomatous changes or evidence of malignancy identified.   [facility] Kitchen KIDNEY STONE SURGERY    . LAPAROSCOPY N/A 11/02/2017   Procedure: LAPAROSCOPIC ILEOCECECTOMY;  Surgeon: Leighton Ruff, MD;  Location: [facility];  Service: General;  Laterality: N/A;     OB History   None      Home Medications    Prior to Admission medications   Medication Sig Start Date End Date Taking? Authorizing Provider  aspirin EC 81 MG tablet Take 81 mg by mouth at bedtime.     [provider]  Calcium Carbonate-Vitamin D (CALTRATE 600+D) 600-400 MG-UNIT per tablet Take 1 tablet by mouth 2 (two) times daily.    [provider]  folic acid (FOLVITE) 488 MCG tablet Take 800 mcg by mouth daily.     [provider]  hydrochlorothiazide (HYDRODIURIL) 25 MG tablet Take one tablet qam Patient taking differently: Take 25 mg by mouth every morning. Take one tablet qam 09/26/17   Mikey Kirschner, MD  Mesalamine (ASACOL) 400 MG CPDR DR capsule Take 2 capsules (800 mg total) by mouth 3 (three) times daily. 8/91/69   Leighton Ruff, MD  Multiple Vitamins-Minerals (MULTIVITAMIN WITH MINERALS)  tablet Take 1 tablet by mouth daily.    [provider]  verapamil (CALAN-SR) 180 MG CR tablet Take one tablet at bedtime Patient taking differently: Take 180 mg by mouth at bedtime. Take one tablet at bedtime 09/26/17   Mikey Kirschner, MD    Family History Family History  Problem Relation Age of Onset  . Hypertension Mother   . Heart disease Father   . Cancer Sister        breast  . Colon cancer Neg Hx     Social  History Social History   Tobacco Use  . Smoking status: Former Smoker    Years: 5.00    Types: Cigarettes  . Smokeless tobacco: Never Used  . Tobacco comment: 1 pack cigarettes per week ; quit in her early 49s   Substance Use Topics  . Alcohol use: No  . Drug use: Yes     Allergies   Patient has no known allergies.   Review of Systems Review of Systems  Constitutional: Negative.   HENT: Negative.   Respiratory: Negative.   Cardiovascular: Negative.   Gastrointestinal: Negative.   Genitourinary: Positive for flank pain.  Skin: Negative.   Neurological: Negative.   Psychiatric/Behavioral: Negative.   All other systems reviewed and are negative.    Physical Exam Updated Vital Signs BP (!) 144/77 (BP Location: Right Arm)   Pulse (!) 101   Temp 98 F (36.7 C) (Oral)   Resp 16   Ht 5' 5"  (1.651 m)   Wt 68 kg (150 lb)   LMP  (LMP Unknown)   SpO2 100%   BMI 24.96 kg/m   Physical Exam  Constitutional: She appears well-developed and well-nourished. No distress.  HENT:  Head: Normocephalic and atraumatic.  Eyes: Pupils are equal, round, and reactive to light. Conjunctivae are normal.  Neck: Neck supple. No tracheal deviation present. No thyromegaly present.  Cardiovascular: Normal rate and regular rhythm.  No murmur heard. Pulmonary/Chest: Effort normal and breath sounds normal.  Abdominal: Soft. Bowel sounds are normal. She exhibits no distension. There is no tenderness.  Genitourinary:  Genitourinary Comments: No flank tenderness  Musculoskeletal: Normal range of motion. She exhibits no edema or tenderness.  Neurological: She is alert. Coordination normal.  Skin: Skin is warm and dry. No rash noted.  Psychiatric: She has a normal mood and affect.  Nursing note and vitals reviewed.    ED Treatments / Results  Labs (all labs ordered are listed, but only abnormal results are displayed) Labs Reviewed  URINALYSIS, ROUTINE W REFLEX MICROSCOPIC - Abnormal;  Notable for the following components:      Result Value   APPearance HAZY (*)    Hgb urine dipstick MODERATE (*)    Leukocytes, UA LARGE (*)    Bacteria, UA RARE (*)    Squamous Epithelial / LPF 6-30 (*)    All other components within normal limits  BASIC METABOLIC PANEL  CBC WITH DIFFERENTIAL/PLATELET    EKG None Results for orders placed or performed during the hospital encounter of 01/05/18  Urinalysis, Routine w reflex microscopic  Result Value Ref Range   Color, Urine YELLOW YELLOW   APPearance HAZY (A) CLEAR   Specific Gravity, Urine 1.018 1.005 - 1.030   pH 5.0 5.0 - 8.0   Glucose, UA NEGATIVE NEGATIVE mg/dL   Hgb urine dipstick MODERATE (A) NEGATIVE   Bilirubin Urine NEGATIVE NEGATIVE   Ketones, ur NEGATIVE NEGATIVE mg/dL   Protein, ur NEGATIVE NEGATIVE mg/dL   Nitrite  NEGATIVE NEGATIVE   Leukocytes, UA LARGE (A) NEGATIVE   RBC / HPF 6-30 0 - 5 RBC/hpf   WBC, UA TOO NUMEROUS TO COUNT 0 - 5 WBC/hpf   Bacteria, UA RARE (A) NONE SEEN   Squamous Epithelial / LPF 6-30 (A) NONE SEEN   Mucus PRESENT   Basic metabolic panel  Result Value Ref Range   Sodium 135 135 - 145 mmol/L   Potassium 3.6 3.5 - 5.1 mmol/L   Chloride 99 (L) 101 - 111 mmol/L   CO2 25 22 - 32 mmol/L   Glucose, Bld 109 (H) 65 - 99 mg/dL   BUN 20 6 - 20 mg/dL   Creatinine, Ser 1.58 (H) 0.44 - 1.00 mg/dL   Calcium 9.3 8.9 - 10.3 mg/dL   GFR calc non Af Amer 31 (L) >60 mL/min   GFR calc Af Amer 36 (L) >60 mL/min   Anion gap 11 5 - 15  CBC with Differential/Platelet  Result Value Ref Range   WBC 12.1 (H) 4.0 - 10.5 K/uL   RBC 4.14 3.87 - 5.11 MIL/uL   Hemoglobin 12.1 12.0 - 15.0 g/dL   HCT 39.1 36.0 - 46.0 %   MCV 94.4 78.0 - 100.0 fL   MCH 29.2 26.0 - 34.0 pg   MCHC 30.9 30.0 - 36.0 g/dL   RDW 14.9 11.5 - 15.5 %   Platelets 268 150 - 400 K/uL   Neutrophils Relative % 89 %   Neutro Abs 10.8 (H) 1.7 - 7.7 K/uL   Lymphocytes Relative 5 %   Lymphs Abs 0.7 0.7 - 4.0 K/uL   Monocytes Relative 6 %     Monocytes Absolute 0.7 0.1 - 1.0 K/uL   Eosinophils Relative 0 %   Eosinophils Absolute 0.0 0.0 - 0.7 K/uL   Basophils Relative 0 %   Basophils Absolute 0.0 0.0 - 0.1 K/uL  Hepatic function panel  Result Value Ref Range   Total Protein 7.1 6.5 - 8.1 g/dL   Albumin 3.4 (L) 3.5 - 5.0 g/dL   AST 20 15 - 41 U/L   ALT 13 (L) 14 - 54 U/L   Alkaline Phosphatase 62 38 - 126 U/L   Total Bilirubin 1.0 0.3 - 1.2 mg/dL   Bilirubin, Direct 0.1 0.1 - 0.5 mg/dL   Indirect Bilirubin 0.9 0.3 - 0.9 mg/dL   Ct Renal Stone Study  Result Date: 01/05/2018 CLINICAL DATA:  Left flank pain.  History of kidney stones. EXAM: CT ABDOMEN AND PELVIS WITHOUT CONTRAST TECHNIQUE: Multidetector CT imaging of the abdomen and pelvis was performed following the standard protocol without IV contrast. COMPARISON:  CT abdomen pelvis dated September 15, 2017. FINDINGS: Lower chest: No acute abnormality. Hepatobiliary: Unchanged 7 mm subcentimeter hypodense lesion in segment 7. No new focal liver abnormality. Prominent distention of the gallbladder without wall thickening. Mild dilatation of the common bile duct to 9 mm is unchanged. Pancreas: Unremarkable. No pancreatic ductal dilatation or surrounding inflammatory changes. Spleen: Normal in size without focal abnormality. Adrenals/Urinary Tract: The adrenal glands are unremarkable. The previously seen 1.9 cm calculus on the left has migrated to the UPJ junction, with resultant moderate to severe left hydronephrosis and prominent inflammatory changes surrounding the severely atrophic left kidney. Nonobstructive calculi in the right kidney are unchanged. The bladder is unremarkable. Stomach/Bowel: Postsurgical changes related to interval ileocecectomy with patent anastomosis. Mild to moderate colonic stool burden. Stomach is within normal limits. No bowel wall thickening, distention, or surrounding inflammatory changes. Mild sigmoid diverticulosis. Unchanged  periampullary duodenal  diverticulum. Unchanged diverticulum at the duodenal jejunal junction. Vascular/Lymphatic: Aortic atherosclerosis. Prominent left retroperitoneal lymph nodes near the left renal hilum are likely reactive. Reproductive: Uterus and bilateral adnexa are unremarkable for the patient's age. Other: No free fluid or pneumoperitoneum. Unchanged stellate band-like mesenteric scarring in the pelvis (series 2, image 71), likely sequelae of inflammatory bowel disease. Musculoskeletal: No acute or significant osseous findings. Stable degenerative changes of the lumbar spine. IMPRESSION: 1. Interval migration of the large left 1.9 cm renal calculus to the UPJ with resultant moderate to severe left hydronephrosis and prominent left perinephric inflammatory changes. 2. Unchanged nonobstructive right nephrolithiasis. 3. Interval ileocecectomy.  No bowel inflammatory changes. 4. Prominent gallbladder distension without wall thickening or cholelithiasis. Unchanged mild dilatation of the common bile duct to 9 mm. Correlate with LFTs. 5.  Aortic atherosclerosis (ICD10-I70.0). Electronically Signed   By: Titus Dubin M.D.   On: 01/05/2018 16:20   Radiology No results found.  Procedures Procedures (including critical care time)  Medications Ordered in ED Medications - No data to display   Initial Impression / Assessment and Plan / ED Course  I have reviewed the triage vital signs and the nursing notes.  Pertinent labs & imaging results that were available during my care of the patient were reviewed by me and considered in my medical decision making (see chart for details).    640 p.m. Resting comfortably treatment with intravenous morphine.  Pain is well controlled.  Received is Rocephin 1 g IV here I discussed case with Dr. Alyson Ingles, urologist.  He suggested transfer to [facility] for admission to hospitalist service and he will see patient in consultation.  I have consulted Dr. Shanon Brow who will arrange for  admission and transfer Final Clinical Impressions(s) / ED Diagnoses   Dx #1 ureteral colic #2  Acute pyelonephritis #3 chronic renal insufficiency Final diagnoses:  None    ED Discharge Orders    None       Orlie Dakin, MD 01/05/18 0981    Orlie Dakin, MD 01/05/18 1925

## 2018-01-05 NOTE — H&P (Signed)
Chief Complaint: Patient was seen in consultation today for  Chief Complaint  Patient presents with  . Flank Pain    Referring Physician(s): Nicolette Bang, MD  Patient Status: [facility] - In-pt  History of Present Illness: Christine Newman is a 77 y.o. female with history of renal stones and left flank for 2 days.  She has been spiking fevers with chills.  CT of abdomen demonstrates severe left hydronephrosis with perinephric stranding from a 1.9 cm UPJ stone.  Patient has been evaluated by Dr. Alyson Ingles in Urology who feels that patient would benefit from a nephrostomy tube at this time.  Patient has not eaten today and barely had anything to drink today.  PMH is significant for HTN, CKD and recent ileocecetomy for Crohn's.   Past Medical History:  Diagnosis Date  . Anal polyp 2005   condyloma acuminatum  . CKD (chronic kidney disease)    Cr 1.9 -09/2014  . Colonic mass   . Diverticulitis    with abscess  . Hypertension     Past Surgical History:  Procedure Laterality Date  . anal polypectomy  01/02/2004   Dr.Rosenbower- anal polyp removed. bx= condyloma acuminatum  . COLONOSCOPY  04/08/2003   Dr.Mann- small nonbleeding internal and external hemorrhoids small polypoid mass at anal verge, normal appearing L colon, transverse colon, R colon including cecum. stenotic ileocecal valve. bx of maa= benign rectal type mucosa with features of mucosal prolapse syndrome  . COLONOSCOPY N/A 04/02/2015   Dr.Rourk- colonic diverticulosis, no evidence of colonic neoplasm. abnormal ileocecal valve with ulceration bx= ulcerated/eroded benign ileocecal valve mucosa  . FLEXIBLE SIGMOIDOSCOPY  11/06/2003   Dr.Mann- small nodular mass at anal verge, bx done. early L side diverticula. bx= benign rectal mucosa with features of mucosal prolapse syndrome. no adenomatous changes or evidence of malignancy identified.   Marland Kitchen KIDNEY STONE SURGERY    . LAPAROSCOPY N/A 11/02/2017   Procedure: LAPAROSCOPIC  ILEOCECECTOMY;  Surgeon: Leighton Ruff, MD;  Location: [facility];  Service: General;  Laterality: N/A;    Allergies: Patient has no known allergies.  Medications: Prior to Admission medications   Medication Sig Start Date End Date Taking? Authorizing Provider  aspirin EC 81 MG tablet Take 81 mg by mouth at bedtime.    Yes [provider]  folic acid (FOLVITE) 785 MCG tablet Take 800 mcg by mouth daily.    Yes [provider]  hydrochlorothiazide (HYDRODIURIL) 25 MG tablet Take one tablet qam Patient taking differently: Take 25 mg by mouth every morning. Take one tablet qam 09/26/17  Yes Mikey Kirschner, MD  Mesalamine (ASACOL) 400 MG CPDR DR capsule Take 2 capsules (800 mg total) by mouth 3 (three) times daily. 8/85/02  Yes Leighton Ruff, MD  Multiple Vitamins-Minerals (MULTIVITAMIN WITH MINERALS) tablet Take 1 tablet by mouth daily.   Yes [provider]  verapamil (CALAN-SR) 180 MG CR tablet Take one tablet at bedtime Patient taking differently: Take 180 mg by mouth at bedtime. Take one tablet at bedtime 09/26/17  Yes Mikey Kirschner, MD     Family History  Problem Relation Age of Onset  . Hypertension Mother   . Heart disease Father   . Cancer Sister        breast  . Colon cancer Neg Hx     Social History   Socioeconomic History  . Marital status: Widowed    Spouse name: Not on file  . Number of children: Not on file  . Years of  education: Not on file  . Highest education level: Not on file  Occupational History  . Not on file  Social Needs  . Financial resource strain: Not on file  . Food insecurity:    Worry: Not on file    Inability: Not on file  . Transportation needs:    Medical: Not on file    Non-medical: Not on file  Tobacco Use  . Smoking status: Former Smoker    Years: 5.00    Types: Cigarettes  . Smokeless tobacco: Never Used  . Tobacco comment: 1 pack cigarettes per week ; quit in her early 4s   Substance and Sexual  Activity  . Alcohol use: No  . Drug use: Yes  . Sexual activity: Not on file  Lifestyle  . Physical activity:    Days per week: Not on file    Minutes per session: Not on file  . Stress: Not on file  Relationships  . Social connections:    Talks on phone: Not on file    Gets together: Not on file    Attends religious service: Not on file    Active member of club or organization: Not on file    Attends meetings of clubs or organizations: Not on file    Relationship status: Not on file  Other Topics Concern  . Not on file  Social History Narrative  . Not on file    Review of Systems: A 12 point ROS discussed and pertinent positives are indicated in the HPI above.  All other systems are negative.  Review of Systems  Constitutional: Positive for chills and fever.  Respiratory: Negative.   Cardiovascular: Negative.   Gastrointestinal: Positive for abdominal pain.       Left flank pain  Genitourinary: Negative for dysuria and hematuria.    Vital Signs: BP 110/73 (BP Location: Right Arm)   Pulse 93   Temp 98.3 F (36.8 C) (Oral)   Resp 18   Ht 5' 5"  (1.651 m)   Wt 147 lb (66.7 kg)   LMP  (LMP Unknown)   SpO2 98%   BMI 24.46 kg/m   Physical Exam  Constitutional: She is oriented to person, place, and time. No distress.  HENT:  Mouth/Throat: Oropharynx is clear and moist.  Cardiovascular: Normal rate, regular rhythm and normal heart sounds.  Pulmonary/Chest: Effort normal and breath sounds normal. No respiratory distress. She has no wheezes.  Abdominal: Soft.  Left flank tenderness  Neurological: She is alert and oriented to person, place, and time.    Imaging: Ct Renal Stone Study  Result Date: 01/05/2018 CLINICAL DATA:  Left flank pain.  History of kidney stones. EXAM: CT ABDOMEN AND PELVIS WITHOUT CONTRAST TECHNIQUE: Multidetector CT imaging of the abdomen and pelvis was performed following the standard protocol without IV contrast. COMPARISON:  CT abdomen pelvis  dated September 15, 2017. FINDINGS: Lower chest: No acute abnormality. Hepatobiliary: Unchanged 7 mm subcentimeter hypodense lesion in segment 7. No new focal liver abnormality. Prominent distention of the gallbladder without wall thickening. Mild dilatation of the common bile duct to 9 mm is unchanged. Pancreas: Unremarkable. No pancreatic ductal dilatation or surrounding inflammatory changes. Spleen: Normal in size without focal abnormality. Adrenals/Urinary Tract: The adrenal glands are unremarkable. The previously seen 1.9 cm calculus on the left has migrated to the UPJ junction, with resultant moderate to severe left hydronephrosis and prominent inflammatory changes surrounding the severely atrophic left kidney. Nonobstructive calculi in the right kidney are unchanged. The bladder  is unremarkable. Stomach/Bowel: Postsurgical changes related to interval ileocecectomy with patent anastomosis. Mild to moderate colonic stool burden. Stomach is within normal limits. No bowel wall thickening, distention, or surrounding inflammatory changes. Mild sigmoid diverticulosis. Unchanged periampullary duodenal diverticulum. Unchanged diverticulum at the duodenal jejunal junction. Vascular/Lymphatic: Aortic atherosclerosis. Prominent left retroperitoneal lymph nodes near the left renal hilum are likely reactive. Reproductive: Uterus and bilateral adnexa are unremarkable for the patient's age. Other: No free fluid or pneumoperitoneum. Unchanged stellate band-like mesenteric scarring in the pelvis (series 2, image 71), likely sequelae of inflammatory bowel disease. Musculoskeletal: No acute or significant osseous findings. Stable degenerative changes of the lumbar spine. IMPRESSION: 1. Interval migration of the large left 1.9 cm renal calculus to the UPJ with resultant moderate to severe left hydronephrosis and prominent left perinephric inflammatory changes. 2. Unchanged nonobstructive right nephrolithiasis. 3. Interval  ileocecectomy.  No bowel inflammatory changes. 4. Prominent gallbladder distension without wall thickening or cholelithiasis. Unchanged mild dilatation of the common bile duct to 9 mm. Correlate with LFTs. 5.  Aortic atherosclerosis (ICD10-I70.0). Electronically Signed   By: Titus Dubin M.D.   On: 01/05/2018 16:20    Labs:  CBC: Recent Labs    11/03/17 0434 11/04/17 0453 11/05/17 0406 01/05/18 1450  WBC 12.2* 15.5* 10.8* 12.1*  HGB 12.0 12.4 11.5* 12.1  HCT 37.6 39.1 35.8* 39.1  PLT 266 312 288 268    COAGS: Recent Labs    01/05/18 2216  INR 1.06    BMP: Recent Labs    11/03/17 0434 11/04/17 0453 11/05/17 0406 01/05/18 1450  NA 135 133* 133* 135  K 4.5 3.9 3.9 3.6  CL 102 97* 99* 99*  CO2 24 27 30 25   GLUCOSE 119* 123* 97 109*  BUN 16 18 16 20   CALCIUM 8.8* 8.9 8.3* 9.3  CREATININE 1.49* 1.40* 1.43* 1.58*  GFRNONAA 33* 36* 35* 31*  GFRAA 38* 41* 40* 36*    LIVER FUNCTION TESTS: Recent Labs    03/01/17 0810 06/30/17 1429 01/05/18 1448  BILITOT 0.4 0.8 1.0  AST 21 23 20   ALT 13 11* 13*  ALKPHOS 79 74 62  PROT 6.0 7.3 7.1  ALBUMIN 3.9 3.8 3.4*    TUMOR MARKERS: No results for input(s): AFPTM, CEA, CA199, CHROMGRNA in the last 8760 hours.  Assessment and Plan:  77 yo with an obstructing left renal stone with fevers and concern for sepsis.  Patient would benefit from urgent decompression of the left renal collecting system.  Discussed with Dr. Alyson Ingles of Urology and we agree that a percutaneous nephrostomy is warranted.  Discussed nephrostomy tube placement with patient.  Risks include but not limited to infection and bleeding.  Patient is receiving IV antibiotics at this time. Patient is agreeable to the procedure and plan for an image guided left percutaneous nephrostomy tube placement with moderate sedation this evening.      Thank you for this interesting consult.  I greatly enjoyed meeting Karlton Lemon and look forward to participating in  their care.  A copy of this report was sent to the requesting provider on this date.  Electronically Signed: Burman Riis, MD 01/05/2018, 11:13 PM   I spent a total of 20 Minutes   in face to face in clinical consultation, greater than 50% of which was counseling/coordinating care for hydronephrosis and sepsis.

## 2018-01-05 NOTE — H&P (Signed)
History and Physical    Christine Newman PIR:[id number] DOB: 08/24/1941 DOA: 01/05/2018  PCP: Mikey Kirschner, MD  Patient coming from: Home  Chief Complaint:  Left flank pain  HPI: Christine Newman is a 77 y.o. female with medical history significant of kidney stones, hypertension comes in with 2 days of worsening left flank pain that is radiating to her left groin.  Been spiking fevers up to 103 at home.  She is denies any nausea vomiting or diarrhea.  Patient is found to have a renal stone with hydronephrosis.  Urology at [facility] has been called his record recommending transfer to [facility] along for procedure in the morning.   Review of Systems: As per HPI otherwise 10 point review of systems negative.   Past Medical History:  Diagnosis Date  . Anal polyp 2005   condyloma acuminatum  . CKD (chronic kidney disease)    Cr 1.9 -09/2014  . Colonic mass   . Diverticulitis    with abscess  . Hypertension     Past Surgical History:  Procedure Laterality Date  . anal polypectomy  01/02/2004   Dr.Rosenbower- anal polyp removed. bx= condyloma acuminatum  . COLONOSCOPY  04/08/2003   Dr.Mann- small nonbleeding internal and external hemorrhoids small polypoid mass at anal verge, normal appearing L colon, transverse colon, R colon including cecum. stenotic ileocecal valve. bx of maa= benign rectal type mucosa with features of mucosal prolapse syndrome  . COLONOSCOPY N/A 04/02/2015   Dr.Rourk- colonic diverticulosis, no evidence of colonic neoplasm. abnormal ileocecal valve with ulceration bx= ulcerated/eroded benign ileocecal valve mucosa  . FLEXIBLE SIGMOIDOSCOPY  11/06/2003   Dr.Mann- small nodular mass at anal verge, bx done. early L side diverticula. bx= benign rectal mucosa with features of mucosal prolapse syndrome. no adenomatous changes or evidence of malignancy identified.   Marland Kitchen KIDNEY STONE SURGERY    . LAPAROSCOPY N/A 11/02/2017   Procedure: LAPAROSCOPIC ILEOCECECTOMY;   Surgeon: Leighton Ruff, MD;  Location: [facility];  Service: General;  Laterality: N/A;     reports that she has quit smoking. Her smoking use included cigarettes. She quit after 5.00 years of use. She has never used smokeless tobacco. She reports that she has current or past drug history. She reports that she does not drink alcohol.  No Known Allergies  Family History  Problem Relation Age of Onset  . Hypertension Mother   . Heart disease Father   . Cancer Sister        breast  . Colon cancer Neg Hx     Prior to Admission medications   Medication Sig Start Date End Date Taking? Authorizing Provider  aspirin EC 81 MG tablet Take 81 mg by mouth at bedtime.    Yes [provider]  folic acid (FOLVITE) 630 MCG tablet Take 800 mcg by mouth daily.    Yes [provider]  hydrochlorothiazide (HYDRODIURIL) 25 MG tablet Take one tablet qam Patient taking differently: Take 25 mg by mouth every morning. Take one tablet qam 09/26/17  Yes Mikey Kirschner, MD  Mesalamine (ASACOL) 400 MG CPDR DR capsule Take 2 capsules (800 mg total) by mouth 3 (three) times daily. 1/60/10  Yes Leighton Ruff, MD  Multiple Vitamins-Minerals (MULTIVITAMIN WITH MINERALS) tablet Take 1 tablet by mouth daily.   Yes [provider]  verapamil (CALAN-SR) 180 MG CR tablet Take one tablet at bedtime Patient taking differently: Take 180 mg by mouth at bedtime. Take one tablet at bedtime 09/26/17  Yes  Mikey Kirschner, MD    Physical Exam: Vitals:   01/05/18 1630 01/05/18 1700 01/05/18 1846 01/05/18 2007  BP: 116/67 130/72 (!) 142/63   Pulse: 94 100 99   Resp:  16 19   Temp:   (!) 103.3 F (39.6 C) (!) 102.8 F (39.3 C)  TempSrc:   Oral Oral  SpO2: 95% 92% 95%   Weight:      Height:          Constitutional: NAD, calm, comfortable Vitals:   01/05/18 1630 01/05/18 1700 01/05/18 1846 01/05/18 2007  BP: 116/67 130/72 (!) 142/63   Pulse: 94 100 99   Resp:  16 19   Temp:   (!) 103.3 F  (39.6 C) (!) 102.8 F (39.3 C)  TempSrc:   Oral Oral  SpO2: 95% 92% 95%   Weight:      Height:       Eyes: PERRL, lids and conjunctivae normal ENMT: Mucous membranes are moist. Posterior pharynx clear of any exudate or lesions.Normal dentition.  Neck: normal, supple, no masses, no thyromegaly Respiratory: clear to auscultation bilaterally, no wheezing, no crackles. Normal respiratory effort. No accessory muscle use.  Cardiovascular: Regular rate and rhythm, no murmurs / rubs / gallops. No extremity edema. 2+ pedal pulses. No carotid bruits.  Abdomen: no tenderness, no masses palpated. No hepatosplenomegaly. Bowel sounds positive.  Musculoskeletal: no clubbing / cyanosis. No joint deformity upper and lower extremities. Good ROM, no contractures. Normal muscle tone.  Skin: no rashes, lesions, ulcers. No induration Neurologic: CN 2-12 grossly intact. Sensation intact, DTR normal. Strength 5/5 in all 4.  Psychiatric: Normal judgment and insight. Alert and oriented x 3. Normal mood.    Labs on Admission: I have personally reviewed following labs and imaging studies  CBC: Recent Labs  Lab 01/05/18 1450  WBC 12.1*  NEUTROABS 10.8*  HGB 12.1  HCT 39.1  MCV 94.4  PLT 765   Basic Metabolic Panel: Recent Labs  Lab 01/05/18 1450  NA 135  K 3.6  CL 99*  CO2 25  GLUCOSE 109*  BUN 20  CREATININE 1.58*  CALCIUM 9.3   GFR: Estimated Creatinine Clearance: 27.7 mL/min (A) (by C-G formula based on SCr of 1.58 mg/dL (H)). Liver Function Tests: Recent Labs  Lab 01/05/18 1448  AST 20  ALT 13*  ALKPHOS 62  BILITOT 1.0  PROT 7.1  ALBUMIN 3.4*   No results for input(s): LIPASE, AMYLASE in the last 168 hours. No results for input(s): AMMONIA in the last 168 hours. Coagulation Profile: No results for input(s): INR, PROTIME in the last 168 hours. Cardiac Enzymes: No results for input(s): CKTOTAL, CKMB, CKMBINDEX, TROPONINI in the last 168 hours. BNP (last 3 results) No results  for input(s): PROBNP in the last 8760 hours. HbA1C: No results for input(s): HGBA1C in the last 72 hours. CBG: No results for input(s): GLUCAP in the last 168 hours. Lipid Profile: No results for input(s): CHOL, HDL, LDLCALC, TRIG, CHOLHDL, LDLDIRECT in the last 72 hours. Thyroid Function Tests: No results for input(s): TSH, T4TOTAL, FREET4, T3FREE, THYROIDAB in the last 72 hours. Anemia Panel: No results for input(s): VITAMINB12, FOLATE, FERRITIN, TIBC, IRON, RETICCTPCT in the last 72 hours. Urine analysis:    Component Value Date/Time   COLORURINE YELLOW 01/05/2018 1404   APPEARANCEUR HAZY (A) 01/05/2018 1404   LABSPEC 1.018 01/05/2018 1404   PHURINE 5.0 01/05/2018 1404   GLUCOSEU NEGATIVE 01/05/2018 1404   HGBUR MODERATE (A) 01/05/2018 1404   BILIRUBINUR NEGATIVE  01/05/2018 1404   KETONESUR NEGATIVE 01/05/2018 1404   PROTEINUR NEGATIVE 01/05/2018 1404   UROBILINOGEN 0.2 01/13/2015 1908   NITRITE NEGATIVE 01/05/2018 1404   LEUKOCYTESUR LARGE (A) 01/05/2018 1404   Sepsis Labs: !!!!!!!!!!!!!!!!!!!!!!!!!!!!!!!!!!!!!!!!!!!! @LABRCNTIP (procalcitonin:4,lacticidven:4) )No results found for this or any previous visit (from the past 240 hour(s)).   Radiological Exams on Admission: Ct Renal Stone Study  Result Date: 01/05/2018 CLINICAL DATA:  Left flank pain.  History of kidney stones. EXAM: CT ABDOMEN AND PELVIS WITHOUT CONTRAST TECHNIQUE: Multidetector CT imaging of the abdomen and pelvis was performed following the standard protocol without IV contrast. COMPARISON:  CT abdomen pelvis dated September 15, 2017. FINDINGS: Lower chest: No acute abnormality. Hepatobiliary: Unchanged 7 mm subcentimeter hypodense lesion in segment 7. No new focal liver abnormality. Prominent distention of the gallbladder without wall thickening. Mild dilatation of the common bile duct to 9 mm is unchanged. Pancreas: Unremarkable. No pancreatic ductal dilatation or surrounding inflammatory changes. Spleen: Normal  in size without focal abnormality. Adrenals/Urinary Tract: The adrenal glands are unremarkable. The previously seen 1.9 cm calculus on the left has migrated to the UPJ junction, with resultant moderate to severe left hydronephrosis and prominent inflammatory changes surrounding the severely atrophic left kidney. Nonobstructive calculi in the right kidney are unchanged. The bladder is unremarkable. Stomach/Bowel: Postsurgical changes related to interval ileocecectomy with patent anastomosis. Mild to moderate colonic stool burden. Stomach is within normal limits. No bowel wall thickening, distention, or surrounding inflammatory changes. Mild sigmoid diverticulosis. Unchanged periampullary duodenal diverticulum. Unchanged diverticulum at the duodenal jejunal junction. Vascular/Lymphatic: Aortic atherosclerosis. Prominent left retroperitoneal lymph nodes near the left renal hilum are likely reactive. Reproductive: Uterus and bilateral adnexa are unremarkable for the patient's age. Other: No free fluid or pneumoperitoneum. Unchanged stellate band-like mesenteric scarring in the pelvis (series 2, image 71), likely sequelae of inflammatory bowel disease. Musculoskeletal: No acute or significant osseous findings. Stable degenerative changes of the lumbar spine. IMPRESSION: 1. Interval migration of the large left 1.9 cm renal calculus to the UPJ with resultant moderate to severe left hydronephrosis and prominent left perinephric inflammatory changes. 2. Unchanged nonobstructive right nephrolithiasis. 3. Interval ileocecectomy.  No bowel inflammatory changes. 4. Prominent gallbladder distension without wall thickening or cholelithiasis. Unchanged mild dilatation of the common bile duct to 9 mm. Correlate with LFTs. 5.  Aortic atherosclerosis (ICD10-I70.0). Electronically Signed   By: Titus Dubin M.D.   On: 01/05/2018 16:20    Old chart reviewed  Case discussed with EDP   Assessment/Plan 77 year old female with left  pyelonephritis and stone with hydronephrosis Principal Problem:   Renal stone with pyelonephritis and hydronephrosis-IV Rocephin.  Urine culture obtained.  Transfer to [facility] for urology services.  Keep n.p.o. and hold anticoagulants at this time.  Patient's blood pressure stable and heart rate is also stable.  Active Problems:   Essential hypertension, benign-stable hold blood pressure meds   CKD (chronic kidney disease) stage 3, GFR 30-59 ml/min (HCC)-at baseline of creatinine 1.5   Generalized anxiety disorder-stable   Patient's son is here with her.  They are requesting to drive themselves in their personal vehicle to [facility].  This is a very reliable family and patient.  I have discussed with the nursing staff in the emergency room staff will keep her IV in place I have arranged for her to show up the [facility] emergency department where they will have a bed waiting for her.  She has been instructed not to eat or drink on the way.  DVT prophylaxis: SCDs Code Status: Full Family Communication: Son Disposition Plan: Per day team Consults called: Urology Admission status: Observation   Savalas Monje A MD [facility]  If 7PM-7AM, please contact night-coverage [email or web address] Password [facility]  01/05/2018, 8:26 PM

## 2018-01-05 NOTE — ED Notes (Signed)
Dr. Shanon Brow advised to leave PIV in place for POV transfer to [place] with [place] with date and initials.

## 2018-01-05 NOTE — Procedures (Signed)
I Pre-operative Diagnosis:  Obstructive left renal stone with sepsis      Post-operative Diagnosis: Obstructive left renal stone with sepsis; Pyonephrosis   Indications: Obstructive left renal stone with sepsis  Procedure: Left percutaneous nephrostomy tube placement  Findings: Atrophic left kidney with severe hydronephrosis.  Aspirated 20 ml of pink/yellow purulent fluid from collecting system.  Left kidney was decompressed at end of procedure.  Complications: None     EBL: Minimal  Plan: Follow left renal drainage.  Send fluid for culture.

## 2018-01-05 NOTE — ED Triage Notes (Signed)
Pt reports left flank pain since last night with hx of kidney stones.

## 2018-01-05 NOTE — ED Notes (Signed)
Report given to Environmental education officer at [facility].

## 2018-01-05 NOTE — Consult Note (Signed)
Urology Consult  Referring physician: Dr. Winfred Leeds Reason for referral: sepsis left ureteral calculus  Chief Complaint: left flank pain  History of Present Illness: Christine Newman is a 77yo with a hx of CKD, HTN and nephrolithiasis who presented to [facility] with a 2 day history of sharp, constant, severe, nonradiating left flank pain. Her last stone event was 10 years ago. No exacerbating events. Pain was alleviated with morphine. She has new urinary frequency, urgency. No associated nausea or vomiting. No gross hematuria. No other associated symptoms WBC count is 12. She is febrile to 103. CT stone study shows a 2cm Left proximal ureteral calculus with moderate perinephric stranding.   Past Medical History:  Diagnosis Date  . Anal polyp 2005   condyloma acuminatum  . CKD (chronic kidney disease)    Cr 1.9 -09/2014  . Colonic mass   . Diverticulitis    with abscess  . Hypertension    Past Surgical History:  Procedure Laterality Date  . anal polypectomy  01/02/2004   Dr.Rosenbower- anal polyp removed. bx= condyloma acuminatum  . COLONOSCOPY  04/08/2003   Dr.Mann- small nonbleeding internal and external hemorrhoids small polypoid mass at anal verge, normal appearing L colon, transverse colon, R colon including cecum. stenotic ileocecal valve. bx of maa= benign rectal type mucosa with features of mucosal prolapse syndrome  . COLONOSCOPY N/A 04/02/2015   Dr.Rourk- colonic diverticulosis, no evidence of colonic neoplasm. abnormal ileocecal valve with ulceration bx= ulcerated/eroded benign ileocecal valve mucosa  . FLEXIBLE SIGMOIDOSCOPY  11/06/2003   Dr.Mann- small nodular mass at anal verge, bx done. early L side diverticula. bx= benign rectal mucosa with features of mucosal prolapse syndrome. no adenomatous changes or evidence of malignancy identified.   [facility] KIDNEY STONE SURGERY    . LAPAROSCOPY N/A 11/02/2017   Procedure: LAPAROSCOPIC ILEOCECECTOMY;  Surgeon: Leighton Ruff, MD;   Location: [facility];  Service: General;  Laterality: N/A;    Medications: I have reviewed the patient's current medications. Allergies: No Known Allergies  Family History  Problem Relation Age of Onset  . Hypertension Mother   . Heart disease Father   . Cancer Sister        breast  . Colon cancer Neg Hx    Social History:  reports that she has quit smoking. Her smoking use included cigarettes. She quit after 5.00 years of use. She has never used smokeless tobacco. She reports that she has current or past drug history. She reports that she does not drink alcohol.  Review of Systems  Constitutional: Positive for chills and fever.  Genitourinary: Positive for flank pain, frequency and urgency.  All other systems reviewed and are negative.   Physical Exam:  Vital signs in last 24 hours: Temp:  [98 F (36.7 C)-103.3 F (39.6 C)] 102.8 F (39.3 C) (03/28 2007) Pulse Rate:  [94-101] 99 (03/28 1846) Resp:  [16-19] 19 (03/28 1846) BP: (116-152)/(63-80) 142/63 (03/28 1846) SpO2:  [92 %-100 %] 95 % (03/28 1846) Weight:  [68 kg (150 lb)] 68 kg (150 lb) (03/28 1348) Physical Exam  Constitutional: She is oriented to person, place, and time. She appears well-developed and well-nourished.  HENT:  Head: Normocephalic and atraumatic.  Eyes: Pupils are equal, round, and reactive to light. EOM are normal.  Neck: Normal range of motion. No thyromegaly present.  Cardiovascular: Normal rate and regular rhythm.  Respiratory: Effort normal.  GI: Soft. She exhibits no distension and no mass. There is no tenderness. There is CVA tenderness. There is no  rebound and no guarding.  Musculoskeletal: Normal range of motion. She exhibits no edema.  Neurological: She is alert and oriented to person, place, and time.  Skin: Skin is warm and dry.  Psychiatric: She has a normal mood and affect. Her behavior is normal. Judgment and thought content normal.    Laboratory Data:  Results for orders placed or  performed during the hospital encounter of 01/05/18 (from the past 72 hour(s))  Urinalysis, Routine w reflex microscopic     Status: Abnormal   Collection Time: 01/05/18  2:04 PM  Result Value Ref Range   Color, Urine YELLOW YELLOW   APPearance HAZY (A) CLEAR   Specific Gravity, Urine 1.018 1.005 - 1.030   pH 5.0 5.0 - 8.0   Glucose, UA NEGATIVE NEGATIVE mg/dL   Hgb urine dipstick MODERATE (A) NEGATIVE   Bilirubin Urine NEGATIVE NEGATIVE   Ketones, ur NEGATIVE NEGATIVE mg/dL   Protein, ur NEGATIVE NEGATIVE mg/dL   Nitrite NEGATIVE NEGATIVE   Leukocytes, UA LARGE (A) NEGATIVE   RBC / HPF 6-30 0 - 5 RBC/hpf   WBC, UA TOO NUMEROUS TO COUNT 0 - 5 WBC/hpf   Bacteria, UA RARE (A) NONE SEEN   Squamous Epithelial / LPF 6-30 (A) NONE SEEN   Mucus PRESENT     Comment: Performed at [facility], [address] [place], [place] [address]  Hepatic function panel     Status: Abnormal   Collection Time: 01/05/18  2:48 PM  Result Value Ref Range   Total Protein 7.1 6.5 - 8.1 g/dL   Albumin 3.4 (L) 3.5 - 5.0 g/dL   AST 20 15 - 41 U/L   ALT 13 (L) 14 - 54 U/L   Alkaline Phosphatase 62 38 - 126 U/L   Total Bilirubin 1.0 0.3 - 1.2 mg/dL   Bilirubin, Direct 0.1 0.1 - 0.5 mg/dL   Indirect Bilirubin 0.9 0.3 - 0.9 mg/dL    Comment: Performed at [facility], [address] [place], [place] [address]  Basic metabolic panel     Status: Abnormal   Collection Time: 01/05/18  2:50 PM  Result Value Ref Range   Sodium 135 135 - 145 mmol/L   Potassium 3.6 3.5 - 5.1 mmol/L   Chloride 99 (L) 101 - 111 mmol/L   CO2 25 22 - 32 mmol/L   Glucose, Bld 109 (H) 65 - 99 mg/dL   BUN 20 6 - 20 mg/dL   Creatinine, Ser 1.58 (H) 0.44 - 1.00 mg/dL   Calcium 9.3 8.9 - 10.3 mg/dL   GFR calc non Af Amer 31 (L) >60 mL/min   GFR calc Af Amer 36 (L) >60 mL/min    Comment: (NOTE) The eGFR has been calculated using the CKD EPI equation. This calculation has not been validated in all clinical situations. eGFR's persistently  <60 mL/min signify possible Chronic Kidney Disease.    Anion gap 11 5 - 15    Comment: Performed at [facility], [address] [place], [place] [address]  CBC with Differential/Platelet     Status: Abnormal   Collection Time: 01/05/18  2:50 PM  Result Value Ref Range   WBC 12.1 (H) 4.0 - 10.5 K/uL   RBC 4.14 3.87 - 5.11 MIL/uL   Hemoglobin 12.1 12.0 - 15.0 g/dL   HCT 39.1 36.0 - 46.0 %   MCV 94.4 78.0 - 100.0 fL   MCH 29.2 26.0 - 34.0 pg   MCHC 30.9 30.0 - 36.0 g/dL   RDW 14.9  11.5 - 15.5 %   Platelets 268 150 - 400 K/uL   Neutrophils Relative % 89 %   Neutro Abs 10.8 (H) 1.7 - 7.7 K/uL   Lymphocytes Relative 5 %   Lymphs Abs 0.7 0.7 - 4.0 K/uL   Monocytes Relative 6 %   Monocytes Absolute 0.7 0.1 - 1.0 K/uL   Eosinophils Relative 0 %   Eosinophils Absolute 0.0 0.0 - 0.7 K/uL   Basophils Relative 0 %   Basophils Absolute 0.0 0.0 - 0.1 K/uL    Comment: Performed at [facility], [address], [place], [place] [address]   No results found for this or any previous visit (from the past 240 hour(s)). Creatinine: Recent Labs    01/05/18 1450  CREATININE 1.58*   Baseline Creatinine: 1.5  Impression/Assessment:  76yo with left ureteral calculus and sepsis   Plan:  I discussed the management of large ureteral calculi in the setting of sepsis which included ureteral stent placement and nephrostomy tube placement. Due to the size and location of the calculus ureteral stent placement would be unsuccessful. I discussed nephrostomy tube placement with the patient and ultimately management of her calculus with PCNL once her sepsis resolves. I have discussed her case with Dr. Anselm Pancoast with IR and she will have left nephrostomy tube placement tonight. Please continue broad spectrum antibiotics pending urine culture.   Nicolette Bang 01/05/2018, 9:55 PM

## 2018-01-06 ENCOUNTER — Encounter (HOSPITAL_COMMUNITY): Payer: Self-pay | Admitting: Diagnostic Radiology

## 2018-01-06 DIAGNOSIS — Z8249 Family history of ischemic heart disease and other diseases of the circulatory system: Secondary | ICD-10-CM | POA: Diagnosis not present

## 2018-01-06 DIAGNOSIS — A419 Sepsis, unspecified organism: Secondary | ICD-10-CM | POA: Diagnosis present

## 2018-01-06 DIAGNOSIS — Z809 Family history of malignant neoplasm, unspecified: Secondary | ICD-10-CM | POA: Diagnosis not present

## 2018-01-06 DIAGNOSIS — E876 Hypokalemia: Secondary | ICD-10-CM | POA: Diagnosis not present

## 2018-01-06 DIAGNOSIS — Z87442 Personal history of urinary calculi: Secondary | ICD-10-CM | POA: Diagnosis not present

## 2018-01-06 DIAGNOSIS — N200 Calculus of kidney: Secondary | ICD-10-CM | POA: Diagnosis not present

## 2018-01-06 DIAGNOSIS — Z8719 Personal history of other diseases of the digestive system: Secondary | ICD-10-CM | POA: Diagnosis not present

## 2018-01-06 DIAGNOSIS — N183 Chronic kidney disease, stage 3 (moderate): Secondary | ICD-10-CM | POA: Diagnosis present

## 2018-01-06 DIAGNOSIS — I129 Hypertensive chronic kidney disease with stage 1 through stage 4 chronic kidney disease, or unspecified chronic kidney disease: Secondary | ICD-10-CM | POA: Diagnosis present

## 2018-01-06 DIAGNOSIS — N132 Hydronephrosis with renal and ureteral calculous obstruction: Secondary | ICD-10-CM | POA: Diagnosis present

## 2018-01-06 DIAGNOSIS — F411 Generalized anxiety disorder: Secondary | ICD-10-CM | POA: Diagnosis present

## 2018-01-06 DIAGNOSIS — N136 Pyonephrosis: Secondary | ICD-10-CM | POA: Diagnosis present

## 2018-01-06 DIAGNOSIS — Z87891 Personal history of nicotine dependence: Secondary | ICD-10-CM | POA: Diagnosis not present

## 2018-01-06 DIAGNOSIS — K5090 Crohn's disease, unspecified, without complications: Secondary | ICD-10-CM | POA: Diagnosis present

## 2018-01-06 DIAGNOSIS — N23 Unspecified renal colic: Secondary | ICD-10-CM | POA: Diagnosis present

## 2018-01-06 DIAGNOSIS — Z7982 Long term (current) use of aspirin: Secondary | ICD-10-CM | POA: Diagnosis not present

## 2018-01-06 DIAGNOSIS — Z79899 Other long term (current) drug therapy: Secondary | ICD-10-CM | POA: Diagnosis not present

## 2018-01-06 LAB — BASIC METABOLIC PANEL
Anion gap: 11 (ref 5–15)
BUN: 19 mg/dL (ref 6–20)
CALCIUM: 8.7 mg/dL — AB (ref 8.9–10.3)
CO2: 21 mmol/L — ABNORMAL LOW (ref 22–32)
CREATININE: 1.59 mg/dL — AB (ref 0.44–1.00)
Chloride: 106 mmol/L (ref 101–111)
GFR calc Af Amer: 36 mL/min — ABNORMAL LOW (ref >60)
GFR, EST NON AFRICAN AMERICAN: 31 mL/min — AB (ref >60)
Glucose, Bld: 87 mg/dL (ref 65–99)
Potassium: 3.7 mmol/L (ref 3.5–5.1)
SODIUM: 138 mmol/L (ref 135–145)

## 2018-01-06 LAB — CBC
HCT: 34 % — ABNORMAL LOW (ref 36.0–46.0)
Hemoglobin: 10.8 g/dL — ABNORMAL LOW (ref 12.0–15.0)
MCH: 30.3 pg (ref 26.0–34.0)
MCHC: 31.8 g/dL (ref 30.0–36.0)
MCV: 95.2 fL (ref 78.0–100.0)
PLATELETS: 240 10*3/uL (ref 150–400)
RBC: 3.57 MIL/uL — AB (ref 3.87–5.11)
RDW: 15.5 % (ref 11.5–15.5)
WBC: 8.8 10*3/uL (ref 4.0–10.5)

## 2018-01-06 MED ORDER — ACETAMINOPHEN 325 MG PO TABS
650.0000 mg | ORAL_TABLET | ORAL | Status: DC | PRN
  Administered 2018-01-06 (×2): 650 mg via ORAL
  Filled 2018-01-06 (×2): qty 2

## 2018-01-06 MED ORDER — SODIUM CHLORIDE 0.9 % IV BOLUS
250.0000 mL | Freq: Once | INTRAVENOUS | Status: AC
  Administered 2018-01-06: 250 mL via INTRAVENOUS

## 2018-01-06 MED ORDER — MORPHINE SULFATE (PF) 4 MG/ML IV SOLN
2.0000 mg | INTRAVENOUS | Status: DC | PRN
  Administered 2018-01-06: 2 mg via INTRAVENOUS
  Filled 2018-01-06 (×2): qty 1

## 2018-01-06 NOTE — Progress Notes (Signed)
Pt temp 102.9, NP [name] paged. New orders obtained for blood cultures, PRN Tylenol and 258m fluid bolus. Will continue to monitor pt.

## 2018-01-06 NOTE — Progress Notes (Addendum)
PROGRESS NOTE    Christine Newman  YTK:[id number] DOB: 07/04/1941 DOA: 01/05/2018 PCP: Mikey Kirschner, MD     Brief Narrative:  Christine Newman is a 77 y.o. female with medical history significant of kidney stones, hypertension comes in with 2 days of worsening left flank pain that is radiating to her left groin.  She had spiking fevers up to 103 at home.  She is denies any nausea, vomiting, or diarrhea.    Patient was found to have obstructive left renal stone with hydronephrosis.  Urology was consulted.  They recommended IR to place percutaneous nephrostomy tube.  Assessment & Plan:   Principal Problem:   Renal stone Active Problems:   Essential hypertension, benign   CKD (chronic kidney disease) stage 3, GFR 30-59 ml/min (HCC)   Generalized anxiety disorder   Hydronephrosis with renal calculous obstruction   Sepsis secondary to obstructive left renal stone with hydronephrosis, pyelonephritis  -Status post percutaneous nephrostomy tube -Urine culture pending -Blood culture pending  -IV Rocephin -Urology and IR following  Essential hypertension -Holding antihypertensives due to marginal blood pressure   CKD stage III -Baseline creatinine 1.4 -Stable    DVT prophylaxis: SCD Code Status: Full Family Communication: At bedside Disposition Plan: Pending clinical improvement of perc neph tube output, fever resolution, urine culture result    Consultants:   Urology  IR  Procedures:   Perc nephrostomy tube 3/28   Antimicrobials:  Anti-infectives (From admission, onward)   Start     Dose/Rate Route Frequency Ordered Stop   01/06/18 2200  cefTRIAXone (ROCEPHIN) 2 g in sodium chloride 0.9 % 100 mL IVPB     2 g 200 mL/hr over 30 Minutes Intravenous Every 24 hours 01/05/18 2058     01/05/18 2200  cefTRIAXone (ROCEPHIN) 1 g in sodium chloride 0.9 % 100 mL IVPB     1 g 200 mL/hr over 30 Minutes Intravenous  Once 01/05/18 2131 01/05/18 2340   01/05/18 1715   cefTRIAXone (ROCEPHIN) 1 g in sodium chloride 0.9 % 100 mL IVPB     1 g 200 mL/hr over 30 Minutes Intravenous  Once 01/05/18 1703 01/05/18 1858        Subjective: No complaints today.  Denies any flank pain.  No nausea or vomiting, no abdominal pain or chest pain.  Objective: Vitals:   01/06/18 0335 01/06/18 0431 01/06/18 0546 01/06/18 1219  BP:    (!) 111/55  Pulse:    92  Resp:      Temp: (!) 102.9 F (39.4 C) (!) 102.2 F (39 C) (!) 100.4 F (38 C) 100.3 F (37.9 C)  TempSrc: Oral Oral Oral Oral  SpO2:    91%  Weight:  71 kg (156 lb 8.4 oz)    Height:        Intake/Output Summary (Last 24 hours) at 01/06/2018 1333 Last data filed at 01/06/2018 0951 Gross per 24 hour  Intake 489.04 ml  Output 300 ml  Net 189.04 ml   Filed Weights   01/05/18 1348 01/05/18 2156 01/06/18 0431  Weight: 68 kg (150 lb) 66.7 kg (147 lb) 71 kg (156 lb 8.4 oz)    Examination: General exam: Appears calm and comfortable  Respiratory system: Clear to auscultation. Respiratory effort normal. Cardiovascular system: S1 & S2 heard, RRR. No JVD, murmurs, rubs, gallops or clicks. No pedal edema. Gastrointestinal system: Abdomen is nondistended, soft and nontender. No organomegaly or masses felt. Normal bowel sounds heard. GU: +Left PCN tube with frank blood  Central nervous system: Alert and oriented. No focal neurological deficits. Extremities: Symmetric 5 x 5 power. Skin: No rashes, lesions or ulcers Psychiatry: Judgement and insight appear normal. Mood & affect appropriate.   Data Reviewed: I have personally reviewed following labs and imaging studies  CBC: Recent Labs  Lab 01/05/18 1450 01/06/18 0451  WBC 12.1* 8.8  NEUTROABS 10.8*  --   HGB 12.1 10.8*  HCT 39.1 34.0*  MCV 94.4 95.2  PLT 268 542   Basic Metabolic Panel: Recent Labs  Lab 01/05/18 1450 01/06/18 0451  NA 135 138  K 3.6 3.7  CL 99* 106  CO2 25 21*  GLUCOSE 109* 87  BUN 20 19  CREATININE 1.58* 1.59*  CALCIUM  9.3 8.7*   GFR: Estimated Creatinine Clearance: 30.2 mL/min (A) (by C-G formula based on SCr of 1.59 mg/dL (H)). Liver Function Tests: Recent Labs  Lab 01/05/18 1448  AST 20  ALT 13*  ALKPHOS 62  BILITOT 1.0  PROT 7.1  ALBUMIN 3.4*   No results for input(s): LIPASE, AMYLASE in the last 168 hours. No results for input(s): AMMONIA in the last 168 hours. Coagulation Profile: Recent Labs  Lab 01/05/18 2216  INR 1.06   Cardiac Enzymes: No results for input(s): CKTOTAL, CKMB, CKMBINDEX, TROPONINI in the last 168 hours. BNP (last 3 results) No results for input(s): PROBNP in the last 8760 hours. HbA1C: No results for input(s): HGBA1C in the last 72 hours. CBG: No results for input(s): GLUCAP in the last 168 hours. Lipid Profile: No results for input(s): CHOL, HDL, LDLCALC, TRIG, CHOLHDL, LDLDIRECT in the last 72 hours. Thyroid Function Tests: No results for input(s): TSH, T4TOTAL, FREET4, T3FREE, THYROIDAB in the last 72 hours. Anemia Panel: No results for input(s): VITAMINB12, FOLATE, FERRITIN, TIBC, IRON, RETICCTPCT in the last 72 hours. Sepsis Labs: No results for input(s): PROCALCITON, LATICACIDVEN in the last 168 hours.  Recent Results (from the past 240 hour(s))  Aerobic/Anaerobic Culture (surgical/deep wound)     Status: None (Preliminary result)   Collection Time: 01/06/18 12:02 AM  Result Value Ref Range Status   Specimen Description   Final    URINE, CATHETERIZED LEFT NEPHROSTOMY TUBE Performed at [facility] [address] [place], [place] [address]    Special Requests   Final    NONE Performed at [facility], [place] [address], [place], [place] [address]    Gram Stain   Final    ABUNDANT WBC PRESENT, PREDOMINANTLY PMN RARE GRAM NEGATIVE RODS Performed at [facility], [place] [address] [place], [place] [address]    Culture PENDING  Incomplete   Report Status PENDING  Incomplete       Radiology Studies: Ct  Renal Stone Study  Result Date: 01/05/2018 CLINICAL DATA:  Left flank pain.  History of kidney stones. EXAM: CT ABDOMEN AND PELVIS WITHOUT CONTRAST TECHNIQUE: Multidetector CT imaging of the abdomen and pelvis was performed following the standard protocol without IV contrast. COMPARISON:  CT abdomen pelvis dated September 15, 2017. FINDINGS: Lower chest: No acute abnormality. Hepatobiliary: Unchanged 7 mm subcentimeter hypodense lesion in segment 7. No new focal liver abnormality. Prominent distention of the gallbladder without wall thickening. Mild dilatation of the common bile duct to 9 mm is unchanged. Pancreas: Unremarkable. No pancreatic ductal dilatation or surrounding inflammatory changes. Spleen: Normal in size without focal abnormality. Adrenals/Urinary Tract: The adrenal glands are unremarkable. The previously seen 1.9 cm calculus on the left has migrated to the UPJ junction,  with resultant moderate to severe left hydronephrosis and prominent inflammatory changes surrounding the severely atrophic left kidney. Nonobstructive calculi in the right kidney are unchanged. The bladder is unremarkable. Stomach/Bowel: Postsurgical changes related to interval ileocecectomy with patent anastomosis. Mild to moderate colonic stool burden. Stomach is within normal limits. No bowel wall thickening, distention, or surrounding inflammatory changes. Mild sigmoid diverticulosis. Unchanged periampullary duodenal diverticulum. Unchanged diverticulum at the duodenal jejunal junction. Vascular/Lymphatic: Aortic atherosclerosis. Prominent left retroperitoneal lymph nodes near the left renal hilum are likely reactive. Reproductive: Uterus and bilateral adnexa are unremarkable for the patient's age. Other: No free fluid or pneumoperitoneum. Unchanged stellate band-like mesenteric scarring in the pelvis (series 2, image 71), likely sequelae of inflammatory bowel disease. Musculoskeletal: No acute or significant osseous findings.  Stable degenerative changes of the lumbar spine. IMPRESSION: 1. Interval migration of the large left 1.9 cm renal calculus to the UPJ with resultant moderate to severe left hydronephrosis and prominent left perinephric inflammatory changes. 2. Unchanged nonobstructive right nephrolithiasis. 3. Interval ileocecectomy.  No bowel inflammatory changes. 4. Prominent gallbladder distension without wall thickening or cholelithiasis. Unchanged mild dilatation of the common bile duct to 9 mm. Correlate with LFTs. 5.  Aortic atherosclerosis (ICD10-I70.0). Electronically Signed   By: Titus Dubin M.D.   On: 01/05/2018 16:20   Ir Nephrostomy Placement Left  Result Date: 01/06/2018 INDICATION: 77 year old with an obstructive for right kidney stone. Patient is febrile and concern for sepsis. EXAM: PERCUTANEOUS LEFT NEPHROSTOMY TUBE PLACEMENT WITH ULTRASOUND AND FLUOROSCOPIC GUIDANCE COMPARISON:  None. MEDICATIONS: IV Rocephin was given prior to the procedure. ANESTHESIA/SEDATION: Fentanyl 50 mcg IV; Versed 1.5 mg IV Moderate Sedation Time:  21 minutes The patient was continuously monitored during the procedure by the interventional radiology nurse under my direct supervision. CONTRAST:  5 mL-administered into the collecting system(s) FLUOROSCOPY TIME:  Fluoroscopy Time: 30 seconds, 3 mGy COMPLICATIONS: None immediate. PROCEDURE: Informed written consent was obtained from the patient after a thorough discussion of the procedural risks, benefits and alternatives. All questions were addressed. Maximal Sterile Barrier Technique was utilized including caps, mask, sterile gowns, sterile gloves, sterile drape, hand hygiene and skin antiseptic. A timeout was performed prior to the initiation of the procedure. Ultrasound was used to identify a dilated left renal collecting system. The left flank was prepped and draped in sterile fashion. Skin was anesthetized with 1% lidocaine. 21 gauge needle was directed into a dilated lower pole  calyx with ultrasound guidance. Wire was easily advanced into the collecting system. Accustick dilator set was placed. A 10 [place] multipurpose drain was subsequently placed over a wire. Approximately 20 mL of pink/yellow purulent fluid was removed from the collecting system. Fluid was sent for culture. Small amount of contrast and saline was injected in the collecting system. Catheter was attached to a gravity bag. Catheter was sutured to skin. Fluoroscopic and ultrasound images were taken and saved for documentation. FINDINGS: Severe left hydronephrosis with underlying left renal atrophy. Fluoroscopy demonstrated a obstructive 1.9 cm stone at the UPJ. 10 French catheter was successfully placed in the collecting system and purulent fluid was removed. Renal collecting system was decompressed at the end of the procedure. IMPRESSION: Successful percutaneous left nephrostomy tube with ultrasound and fluoroscopic guidance. Purulent fluid was removed from the collecting system and sent for culture. Electronically Signed   By: Markus Daft M.D.   On: 01/06/2018 11:46      Scheduled Meds: . verapamil  180 mg Oral QHS   Continuous Infusions: . sodium chloride 75  mL/hr at 01/05/18 2155  . cefTRIAXone (ROCEPHIN)  IV       LOS: 0 days    Time spent: 30 minutes   Dessa Phi, DO [facility] [email or web address] Password [email or web address] 01/06/2018, 1:33 PM

## 2018-01-06 NOTE — Progress Notes (Signed)
Chief Complaint: Patient was seen today for (L)PCN  Referring Physician(s): Dr. Alyson Newman  Supervising Physician: Christine Newman  Patient Status: [facility] - In-pt  Subjective: S/p (L)PCN last night for obstructing stone with pyonephrosis. Spiked fever last pm to 102.9, currently 100.4 Pt feeling better this am. Son at bedside  Objective: Physical Exam: BP 125/66 (BP Location: Right Arm)   Pulse 91   Temp (!) 100.4 F (38 C) (Oral)   Resp (!) 24   Ht 5' 5"  (1.651 m)   Wt 156 lb 8.4 oz (71 kg)   LMP  (LMP Unknown)   SpO2 96%   BMI 26.05 kg/m  (L)PCN intact, site clean, mildly tender Output thin cloudy bloody   Current Facility-Administered Medications:  .  0.9 %  sodium chloride infusion, , Intravenous, Continuous, Christine Grout, MD, Last Rate: 75 mL/hr at 01/05/18 2155 .  acetaminophen (TYLENOL) tablet 650 mg, 650 mg, Oral, Q4H PRN, Christine Barefoot, NP, 650 mg at 01/06/18 0457 .  cefTRIAXone (ROCEPHIN) 2 g in sodium chloride 0.9 % 100 mL IVPB, 2 g, Intravenous, Q24H, David, Rachal A, MD .  iopamidol (ISOVUE-300) 61 % injection 50 mL, 50 mL, Other, Once PRN, Christine Daft, MD .  iopamidol (ISOVUE-300) 61 % injection, , , ,  .  lidocaine (XYLOCAINE) 1 % (with pres) injection, , , ,  .  morphine 2 MG/ML injection 2 mg, 2 mg, Intravenous, Q2H PRN, Christine Kay A, MD .  ondansetron (ZOFRAN) tablet 4 mg, 4 mg, Oral, Q6H PRN **OR** ondansetron (ZOFRAN) injection 4 mg, 4 mg, Intravenous, Q6H PRN, Christine Kay A, MD .  verapamil (CALAN-SR) CR tablet 180 mg, 180 mg, Oral, QHS, Christine Grout, MD, Stopped at 01/05/18 2249  Labs: CBC Recent Labs    01/05/18 1450 01/06/18 0451  WBC 12.1* 8.8  HGB 12.1 10.8*  HCT 39.1 34.0*  PLT 268 240   BMET Recent Labs    01/05/18 1450 01/06/18 0451  NA 135 138  K 3.6 3.7  CL 99* 106  CO2 25 21*  GLUCOSE 109* 87  BUN 20 19  CREATININE 1.58* 1.59*  CALCIUM 9.3 8.7*   LFT Recent Labs    01/05/18 1448  PROT 7.1    ALBUMIN 3.4*  AST 20  ALT 13*  ALKPHOS 62  BILITOT 1.0  BILIDIR 0.1  IBILI 0.9   PT/INR Recent Labs    01/05/18 2216  LABPROT 13.7  INR 1.06     Studies/Results: Ct Renal Stone Study  Result Date: 01/05/2018 CLINICAL DATA:  Left flank pain.  History of kidney stones. EXAM: CT ABDOMEN AND PELVIS WITHOUT CONTRAST TECHNIQUE: Multidetector CT imaging of the abdomen and pelvis was performed following the standard protocol without IV contrast. COMPARISON:  CT abdomen pelvis dated September 15, 2017. FINDINGS: Lower chest: No acute abnormality. Hepatobiliary: Unchanged 7 mm subcentimeter hypodense lesion in segment 7. No new focal liver abnormality. Prominent distention of the gallbladder without wall thickening. Mild dilatation of the common bile duct to 9 mm is unchanged. Pancreas: Unremarkable. No pancreatic ductal dilatation or surrounding inflammatory changes. Spleen: Normal in size without focal abnormality. Adrenals/Urinary Tract: The adrenal glands are unremarkable. The previously seen 1.9 cm calculus on the left has migrated to the UPJ junction, with resultant moderate to severe left hydronephrosis and prominent inflammatory changes surrounding the severely atrophic left kidney. Nonobstructive calculi in the right kidney are unchanged. The bladder is unremarkable. Stomach/Bowel: Postsurgical changes related to interval ileocecectomy with patent anastomosis.  Mild to moderate colonic stool burden. Stomach is within normal limits. No bowel wall thickening, distention, or surrounding inflammatory changes. Mild sigmoid diverticulosis. Unchanged periampullary duodenal diverticulum. Unchanged diverticulum at the duodenal jejunal junction. Vascular/Lymphatic: Aortic atherosclerosis. Prominent left retroperitoneal lymph nodes near the left renal hilum are likely reactive. Reproductive: Uterus and bilateral adnexa are unremarkable for the patient's age. Other: No free fluid or pneumoperitoneum. Unchanged  stellate band-like mesenteric scarring in the pelvis (series 2, image 71), likely sequelae of inflammatory bowel disease. Musculoskeletal: No acute or significant osseous findings. Stable degenerative changes of the lumbar spine. IMPRESSION: 1. Interval migration of the large left 1.9 cm renal calculus to the UPJ with resultant moderate to severe left hydronephrosis and prominent left perinephric inflammatory changes. 2. Unchanged nonobstructive right nephrolithiasis. 3. Interval ileocecectomy.  No bowel inflammatory changes. 4. Prominent gallbladder distension without wall thickening or cholelithiasis. Unchanged mild dilatation of the common bile duct to 9 mm. Correlate with LFTs. 5.  Aortic atherosclerosis (ICD10-I70.0). Electronically Signed   By: Christine Newman M.D.   On: 01/05/2018 16:20    Assessment/Plan: Obstructing (L) stone with pyonephrosis S/p (L)PCN  Good urine output, WBC down. IR following    LOS: 0 days   I spent Newman total of 15 minutes in face to face in clinical consultation, greater than 50% of which was counseling/coordinating care for [name]  Christine Dike PA-C 01/06/2018 9:58 AM

## 2018-01-06 NOTE — Progress Notes (Signed)
Subjective: Patient reports a decrease in her left flank pain. Fever this morning but otherwise feels well. S/p L neph tube placement  Objective: Vital signs in last 24 hours: Temp:  [98.3 F (36.8 C)-103.3 F (39.6 C)] 100.3 F (37.9 C) (03/29 1219) Pulse Rate:  [80-148] 92 (03/29 1219) Resp:  [16-24] 24 (03/29 0322) BP: (86-152)/(49-80) 111/55 (03/29 1219) SpO2:  [83 %-100 %] 91 % (03/29 1219) Weight:  [66.7 kg (147 lb)-71 kg (156 lb 8.4 oz)] 71 kg (156 lb 8.4 oz) (03/29 0431)  Intake/Output from previous day: 03/28 0701 - 03/29 0700 In: 439 [I.V.:306.3; IV Piggyback:132.8] Out: 300 [Urine:300] Intake/Output this shift: Total I/O In: 650 [I.V.:600; Other:50] Out: -   Physical Exam:  General:alert, cooperative and appears stated age GI: soft, non tender, normal bowel sounds, no palpable masses, no organomegaly, no inguinal hernia Female genitalia: not done Extremities: extremities normal, atraumatic, no cyanosis or edema  Lab Results: Recent Labs    01/05/18 1450 01/06/18 0451  HGB 12.1 10.8*  HCT 39.1 34.0*   BMET Recent Labs    01/05/18 1450 01/06/18 0451  NA 135 138  K 3.6 3.7  CL 99* 106  CO2 25 21*  GLUCOSE 109* 87  BUN 20 19  CREATININE 1.58* 1.59*  CALCIUM 9.3 8.7*   Recent Labs    01/05/18 2216  INR 1.06   No results for input(s): LABURIN in the last 72 hours. Results for orders placed or performed during the hospital encounter of 01/05/18  Aerobic/Anaerobic Culture (surgical/deep wound)     Status: None (Preliminary result)   Collection Time: 01/06/18 12:02 AM  Result Value Ref Range Status   Specimen Description   Final    URINE, CATHETERIZED LEFT NEPHROSTOMY TUBE Performed at [facility] [address], [place], [place] [address]    Special Requests   Final    NONE Performed at [facility], [place] [address] [place], [place] [address]    Gram Stain   Final    ABUNDANT WBC PRESENT,  PREDOMINANTLY PMN RARE GRAM NEGATIVE RODS Performed at [facility], [place] [address] [place], [place] [address]    Culture PENDING  Incomplete   Report Status PENDING  Incomplete    Studies/Results: Ct Renal Stone Study  Result Date: 01/05/2018 CLINICAL DATA:  Left flank pain.  History of kidney stones. EXAM: CT ABDOMEN AND PELVIS WITHOUT CONTRAST TECHNIQUE: Multidetector CT imaging of the abdomen and pelvis was performed following the standard protocol without IV contrast. COMPARISON:  CT abdomen pelvis dated September 15, 2017. FINDINGS: Lower chest: No acute abnormality. Hepatobiliary: Unchanged 7 mm subcentimeter hypodense lesion in segment 7. No new focal liver abnormality. Prominent distention of the gallbladder without wall thickening. Mild dilatation of the common bile duct to 9 mm is unchanged. Pancreas: Unremarkable. No pancreatic ductal dilatation or surrounding inflammatory changes. Spleen: Normal in size without focal abnormality. Adrenals/Urinary Tract: The adrenal glands are unremarkable. The previously seen 1.9 cm calculus on the left has migrated to the UPJ junction, with resultant moderate to severe left hydronephrosis and prominent inflammatory changes surrounding the severely atrophic left kidney. Nonobstructive calculi in the right kidney are unchanged. The bladder is unremarkable. Stomach/Bowel: Postsurgical changes related to interval ileocecectomy with patent anastomosis. Mild to moderate colonic stool burden. Stomach is within normal limits. No bowel wall thickening, distention, or surrounding inflammatory changes. Mild sigmoid diverticulosis. Unchanged periampullary duodenal diverticulum. Unchanged diverticulum at the duodenal jejunal junction. Vascular/Lymphatic: Aortic atherosclerosis.  Prominent left retroperitoneal lymph nodes near the left renal hilum are likely reactive. Reproductive: Uterus and bilateral adnexa are unremarkable for the patient's age. Other: No free  fluid or pneumoperitoneum. Unchanged stellate band-like mesenteric scarring in the pelvis (series 2, image 71), likely sequelae of inflammatory bowel disease. Musculoskeletal: No acute or significant osseous findings. Stable degenerative changes of the lumbar spine. IMPRESSION: 1. Interval migration of the large left 1.9 cm renal calculus to the UPJ with resultant moderate to severe left hydronephrosis and prominent left perinephric inflammatory changes. 2. Unchanged nonobstructive right nephrolithiasis. 3. Interval ileocecectomy.  No bowel inflammatory changes. 4. Prominent gallbladder distension without wall thickening or cholelithiasis. Unchanged mild dilatation of the common bile duct to 9 mm. Correlate with LFTs. 5.  Aortic atherosclerosis (ICD10-I70.0). Electronically Signed   By: Titus Dubin M.D.   On: 01/05/2018 16:20   Ir Nephrostomy Placement Left  Result Date: 01/06/2018 INDICATION: 77 year old with an obstructive for right kidney stone. Patient is febrile and concern for sepsis. EXAM: PERCUTANEOUS LEFT NEPHROSTOMY TUBE PLACEMENT WITH ULTRASOUND AND FLUOROSCOPIC GUIDANCE COMPARISON:  None. MEDICATIONS: IV Rocephin was given prior to the procedure. ANESTHESIA/SEDATION: Fentanyl 50 mcg IV; Versed 1.5 mg IV Moderate Sedation Time:  21 minutes The patient was continuously monitored during the procedure by the interventional radiology nurse under my direct supervision. CONTRAST:  5 mL-administered into the collecting system(s) FLUOROSCOPY TIME:  Fluoroscopy Time: 30 seconds, 3 mGy COMPLICATIONS: None immediate. PROCEDURE: Informed written consent was obtained from the patient after a thorough discussion of the procedural risks, benefits and alternatives. All questions were addressed. Maximal Sterile Barrier Technique was utilized including caps, mask, sterile gowns, sterile gloves, sterile drape, hand hygiene and skin antiseptic. A timeout was performed prior to the initiation of the procedure.  Ultrasound was used to identify a dilated left renal collecting system. The left flank was prepped and draped in sterile fashion. Skin was anesthetized with 1% lidocaine. 21 gauge needle was directed into a dilated lower pole calyx with ultrasound guidance. Wire was easily advanced into the collecting system. Accustick dilator set was placed. A 10 [place] multipurpose drain was subsequently placed over a wire. Approximately 20 mL of pink/yellow purulent fluid was removed from the collecting system. Fluid was sent for culture. Small amount of contrast and saline was injected in the collecting system. Catheter was attached to a gravity bag. Catheter was sutured to skin. Fluoroscopic and ultrasound images were taken and saved for documentation. FINDINGS: Severe left hydronephrosis with underlying left renal atrophy. Fluoroscopy demonstrated a obstructive 1.9 cm stone at the UPJ. 10 French catheter was successfully placed in the collecting system and purulent fluid was removed. Renal collecting system was decompressed at the end of the procedure. IMPRESSION: Successful percutaneous left nephrostomy tube with ultrasound and fluoroscopic guidance. Purulent fluid was removed from the collecting system and sent for culture. Electronically Signed   By: Markus Daft M.D.   On: 01/06/2018 11:46    Assessment/Plan: 76yo with sepsis from a left ureteral calculus s/p nephrostomy tube placement 1. Please continue broad spectrum antibiotics pending urine culture. She will be schedule for left percutaneous nephrostolithotomy in 2-3 week after discharge  LOS: 0 days   Nicolette Bang 01/06/2018, 2:36 PM

## 2018-01-07 LAB — BASIC METABOLIC PANEL
Anion gap: 8 (ref 5–15)
BUN: 18 mg/dL (ref 6–20)
CHLORIDE: 107 mmol/L (ref 101–111)
CO2: 24 mmol/L (ref 22–32)
Calcium: 8.7 mg/dL — ABNORMAL LOW (ref 8.9–10.3)
Creatinine, Ser: 1.48 mg/dL — ABNORMAL HIGH (ref 0.44–1.00)
GFR, EST AFRICAN AMERICAN: 39 mL/min — AB (ref >60)
GFR, EST NON AFRICAN AMERICAN: 33 mL/min — AB (ref >60)
Glucose, Bld: 98 mg/dL (ref 65–99)
POTASSIUM: 3.3 mmol/L — AB (ref 3.5–5.1)
SODIUM: 139 mmol/L (ref 135–145)

## 2018-01-07 LAB — URINE CULTURE: SPECIAL REQUESTS: NORMAL

## 2018-01-07 LAB — CBC
HCT: 33.1 % — ABNORMAL LOW (ref 36.0–46.0)
HEMOGLOBIN: 10.2 g/dL — AB (ref 12.0–15.0)
MCH: 29.7 pg (ref 26.0–34.0)
MCHC: 30.8 g/dL (ref 30.0–36.0)
MCV: 96.2 fL (ref 78.0–100.0)
PLATELETS: 242 10*3/uL (ref 150–400)
RBC: 3.44 MIL/uL — AB (ref 3.87–5.11)
RDW: 15.8 % — ABNORMAL HIGH (ref 11.5–15.5)
WBC: 7.7 10*3/uL (ref 4.0–10.5)

## 2018-01-07 MED ORDER — SENNA 8.6 MG PO TABS: 1.0000 | ORAL_TABLET | Freq: Every day | ORAL | Status: DC | PRN

## 2018-01-07 MED ORDER — DOCUSATE SODIUM 100 MG PO CAPS: 100.0000 mg | ORAL_CAPSULE | Freq: Two times a day (BID) | ORAL | Status: DC | PRN

## 2018-01-07 MED ORDER — POTASSIUM CHLORIDE CRYS ER 20 MEQ PO TBCR
40.0000 meq | EXTENDED_RELEASE_TABLET | Freq: Once | ORAL | Status: AC
  Administered 2018-01-07: 40 meq via ORAL
  Filled 2018-01-07: qty 2

## 2018-01-07 NOTE — Progress Notes (Addendum)
PROGRESS NOTE    Christine Newman  SPQ:[id number] DOB: 20-Jan-1941 DOA: 01/05/2018 PCP: Mikey Kirschner, MD     Brief Narrative:  Christine Newman is a 77 y.o. female with medical history significant of kidney stones, hypertension comes in with 2 days of worsening left flank pain that is radiating to her left groin.  She had spiking fevers up to 103 at home.  She is denies any nausea, vomiting, or diarrhea.    Patient was found to have obstructive left renal stone with hydronephrosis.  Urology was consulted.  They recommended IR to place percutaneous nephrostomy tube.  Assessment & Plan:   Principal Problem:   Renal stone Active Problems:   Essential hypertension, benign   CKD (chronic kidney disease) stage 3, GFR 30-59 ml/min (HCC)   Generalized anxiety disorder   Hydronephrosis with renal calculous obstruction   Sepsis secondary to obstructive left renal stone with hydronephrosis, pyelonephritis  -Status post percutaneous nephrostomy tube -Urine culture +citrobacter freundii, sensitivity pending  -Blood culture negative to date  -IV Rocephin -Urology and IR following -Fever 100.7 last night, will treat with IV antibiotics until remain fever free > 24 hours   Essential hypertension -Resume antihypertensives today   CKD stage III -Baseline creatinine 1.4 -Stable   Hypokalemia -Replace, trend    DVT prophylaxis: SCD Code Status: Full Family Communication: No family at bedside  Disposition Plan: Pending clinical improvement of perc neph tube output, fever resolution, urine culture result    Consultants:   Urology  IR  Procedures:   Perc nephrostomy tube 3/28   Antimicrobials:  Anti-infectives (From admission, onward)   Start     Dose/Rate Route Frequency Ordered Stop   01/06/18 2200  cefTRIAXone (ROCEPHIN) 2 g in sodium chloride 0.9 % 100 mL IVPB     2 g 200 mL/hr over 30 Minutes Intravenous Every 24 hours 01/05/18 2058     01/05/18 2200  cefTRIAXone  (ROCEPHIN) 1 g in sodium chloride 0.9 % 100 mL IVPB     1 g 200 mL/hr over 30 Minutes Intravenous  Once 01/05/18 2131 01/05/18 2340   01/05/18 1715  cefTRIAXone (ROCEPHIN) 1 g in sodium chloride 0.9 % 100 mL IVPB     1 g 200 mL/hr over 30 Minutes Intravenous  Once 01/05/18 1703 01/05/18 1858       Subjective: No issues, no flank pain or nausea, vomiting.   Objective: Vitals:   01/06/18 1219 01/06/18 2041 01/07/18 0527 01/07/18 1338  BP: (!) 111/55 (!) 105/53 (!) 125/95 138/74  Pulse: 92 98 86 90  Resp:  20 20   Temp: 100.3 F (37.9 C) (!) 100.7 F (38.2 C) 99.8 F (37.7 C) 99.5 F (37.5 C)  TempSrc: Oral Oral Oral Oral  SpO2: 91% 94% 95% 99%  Weight:      Height:        Intake/Output Summary (Last 24 hours) at 01/07/2018 1528 Last data filed at 01/07/2018 0800 Gross per 24 hour  Intake -  Output 600 ml  Net -600 ml   Filed Weights   01/05/18 1348 01/05/18 2156 01/06/18 0431  Weight: 68 kg (150 lb) 66.7 kg (147 lb) 71 kg (156 lb 8.4 oz)    Examination: General exam: Appears calm and comfortable  Respiratory system: Clear to auscultation. Respiratory effort normal. Cardiovascular system: S1 & S2 heard, RRR. No JVD, murmurs, rubs, gallops or clicks. No pedal edema. Gastrointestinal system: Abdomen is nondistended, soft and nontender. No organomegaly or masses felt. Normal  bowel sounds heard. GU: +left perc neph drain with serosanguinous fluid  Central nervous system: Alert and oriented. No focal neurological deficits. Extremities: Symmetric 5 x 5 power. Skin: No rashes, lesions or ulcers Psychiatry: Judgement and insight appear normal. Mood & affect appropriate.    Data Reviewed: I have personally reviewed following labs and imaging studies  CBC: Recent Labs  Lab 01/05/18 1450 01/06/18 0451 01/07/18 0453  WBC 12.1* 8.8 7.7  NEUTROABS 10.8*  --   --   HGB 12.1 10.8* 10.2*  HCT 39.1 34.0* 33.1*  MCV 94.4 95.2 96.2  PLT 268 240 932   Basic Metabolic  Panel: Recent Labs  Lab 01/05/18 1450 01/06/18 0451 01/07/18 0453  NA 135 138 139  K 3.6 3.7 3.3*  CL 99* 106 107  CO2 25 21* 24  GLUCOSE 109* 87 98  BUN 20 19 18   CREATININE 1.58* 1.59* 1.48*  CALCIUM 9.3 8.7* 8.7*   GFR: Estimated Creatinine Clearance: 32.5 mL/min (A) (by C-G formula based on SCr of 1.48 mg/dL (H)). Liver Function Tests: Recent Labs  Lab 01/05/18 1448  AST 20  ALT 13*  ALKPHOS 62  BILITOT 1.0  PROT 7.1  ALBUMIN 3.4*   No results for input(s): LIPASE, AMYLASE in the last 168 hours. No results for input(s): AMMONIA in the last 168 hours. Coagulation Profile: Recent Labs  Lab 01/05/18 2216  INR 1.06   Cardiac Enzymes: No results for input(s): CKTOTAL, CKMB, CKMBINDEX, TROPONINI in the last 168 hours. BNP (last 3 results) No results for input(s): PROBNP in the last 8760 hours. HbA1C: No results for input(s): HGBA1C in the last 72 hours. CBG: No results for input(s): GLUCAP in the last 168 hours. Lipid Profile: No results for input(s): CHOL, HDL, LDLCALC, TRIG, CHOLHDL, LDLDIRECT in the last 72 hours. Thyroid Function Tests: No results for input(s): TSH, T4TOTAL, FREET4, T3FREE, THYROIDAB in the last 72 hours. Anemia Panel: No results for input(s): VITAMINB12, FOLATE, FERRITIN, TIBC, IRON, RETICCTPCT in the last 72 hours. Sepsis Labs: No results for input(s): PROCALCITON, LATICACIDVEN in the last 168 hours.  Recent Results (from the past 240 hour(s))  Urine Culture     Status: Abnormal   Collection Time: 01/05/18  2:04 PM  Result Value Ref Range Status   Specimen Description   Final    URINE, CLEAN CATCH Performed at [facility], [address], [place], [place] [address]    Special Requests   Final    Normal Performed at [facility], [address] [place], [place] [address]    Culture MULTIPLE SPECIES PRESENT, SUGGEST RECOLLECTION (A)  Final   Report Status 01/07/2018 FINAL  Final  Aerobic/Anaerobic Culture (surgical/deep wound)      Status: None (Preliminary result)   Collection Time: 01/06/18 12:02 AM  Result Value Ref Range Status   Specimen Description   Final    URINE, CATHETERIZED LEFT NEPHROSTOMY TUBE Performed at [facility] [address], [place], [place] [address]    Special Requests   Final    NONE Performed at [facility], [place] [address], [place], [place] [address]    Gram Stain   Final    ABUNDANT WBC PRESENT, PREDOMINANTLY PMN RARE GRAM NEGATIVE RODS Performed at [facility], [place] [address] [place], [place] [address]    Culture ABUNDANT CITROBACTER FREUNDII  Final   Report Status PENDING  Incomplete  Culture, blood (routine x 2)     Status: None (Preliminary result)  Collection Time: 01/06/18  4:46 AM  Result Value Ref Range Status   Specimen Description   Final    BLOOD RIGHT ANTECUBITAL Performed at [facility] [address], [place], [place] [address]    Special Requests   Final    BOTTLES DRAWN AEROBIC AND ANAEROBIC Blood Culture adequate volume Performed at [facility] [address], [place], [place] [address]    Culture   Final    NO GROWTH 1 DAY Performed at [facility], [place] [address] [place], [place] [address]    Report Status PENDING  Incomplete  Culture, blood (routine x 2)     Status: None (Preliminary result)   Collection Time: 01/06/18  4:51 AM  Result Value Ref Range Status   Specimen Description   Final    BLOOD RIGHT HAND Performed at [facility] [address] [place], [place] [address]    Special Requests   Final    BOTTLES DRAWN AEROBIC AND ANAEROBIC Blood Culture adequate volume Performed at [facility] [address], [place], [place] [address]    Culture   Final    NO GROWTH 1 DAY Performed at [facility], [place] [address], [place], [place] [address]    Report Status PENDING  Incomplete       Radiology  Studies: Ct Renal Stone Study  Result Date: 01/05/2018 CLINICAL DATA:  Left flank pain.  History of kidney stones. EXAM: CT ABDOMEN AND PELVIS WITHOUT CONTRAST TECHNIQUE: Multidetector CT imaging of the abdomen and pelvis was performed following the standard protocol without IV contrast. COMPARISON:  CT abdomen pelvis dated September 15, 2017. FINDINGS: Lower chest: No acute abnormality. Hepatobiliary: Unchanged 7 mm subcentimeter hypodense lesion in segment 7. No new focal liver abnormality. Prominent distention of the gallbladder without wall thickening. Mild dilatation of the common bile duct to 9 mm is unchanged. Pancreas: Unremarkable. No pancreatic ductal dilatation or surrounding inflammatory changes. Spleen: Normal in size without focal abnormality. Adrenals/Urinary Tract: The adrenal glands are unremarkable. The previously seen 1.9 cm calculus on the left has migrated to the UPJ junction, with resultant moderate to severe left hydronephrosis and prominent inflammatory changes surrounding the severely atrophic left kidney. Nonobstructive calculi in the right kidney are unchanged. The bladder is unremarkable. Stomach/Bowel: Postsurgical changes related to interval ileocecectomy with patent anastomosis. Mild to moderate colonic stool burden. Stomach is within normal limits. No bowel wall thickening, distention, or surrounding inflammatory changes. Mild sigmoid diverticulosis. Unchanged periampullary duodenal diverticulum. Unchanged diverticulum at the duodenal jejunal junction. Vascular/Lymphatic: Aortic atherosclerosis. Prominent left retroperitoneal lymph nodes near the left renal hilum are likely reactive. Reproductive: Uterus and bilateral adnexa are unremarkable for the patient's age. Other: No free fluid or pneumoperitoneum. Unchanged stellate band-like mesenteric scarring in the pelvis (series 2, image 71), likely sequelae of inflammatory bowel disease. Musculoskeletal: No acute or significant osseous  findings. Stable degenerative changes of the lumbar spine. IMPRESSION: 1. Interval migration of the large left 1.9 cm renal calculus to the UPJ with resultant moderate to severe left hydronephrosis and prominent left perinephric inflammatory changes. 2. Unchanged nonobstructive right nephrolithiasis. 3. Interval ileocecectomy.  No bowel inflammatory changes. 4. Prominent gallbladder distension without wall thickening or cholelithiasis. Unchanged mild dilatation of the common bile duct to 9 mm. Correlate with LFTs. 5.  Aortic atherosclerosis (ICD10-I70.0). Electronically Signed   By: Titus Dubin M.D.   On: 01/05/2018 16:20   Ir Nephrostomy Placement Left  Result  Date: 01/06/2018 INDICATION: 77 year old with an obstructive for right kidney stone. Patient is febrile and concern for sepsis. EXAM: PERCUTANEOUS LEFT NEPHROSTOMY TUBE PLACEMENT WITH ULTRASOUND AND FLUOROSCOPIC GUIDANCE COMPARISON:  None. MEDICATIONS: IV Rocephin was given prior to the procedure. ANESTHESIA/SEDATION: Fentanyl 50 mcg IV; Versed 1.5 mg IV Moderate Sedation Time:  21 minutes The patient was continuously monitored during the procedure by the interventional radiology nurse under my direct supervision. CONTRAST:  5 mL-administered into the collecting system(s) FLUOROSCOPY TIME:  Fluoroscopy Time: 30 seconds, 3 mGy COMPLICATIONS: None immediate. PROCEDURE: Informed written consent was obtained from the patient after a thorough discussion of the procedural risks, benefits and alternatives. All questions were addressed. Maximal Sterile Barrier Technique was utilized including caps, mask, sterile gowns, sterile gloves, sterile drape, hand hygiene and skin antiseptic. A timeout was performed prior to the initiation of the procedure. Ultrasound was used to identify a dilated left renal collecting system. The left flank was prepped and draped in sterile fashion. Skin was anesthetized with 1% lidocaine. 21 gauge needle was directed into a dilated  lower pole calyx with ultrasound guidance. Wire was easily advanced into the collecting system. Accustick dilator set was placed. A 10 [place] multipurpose drain was subsequently placed over a wire. Approximately 20 mL of pink/yellow purulent fluid was removed from the collecting system. Fluid was sent for culture. Small amount of contrast and saline was injected in the collecting system. Catheter was attached to a gravity bag. Catheter was sutured to skin. Fluoroscopic and ultrasound images were taken and saved for documentation. FINDINGS: Severe left hydronephrosis with underlying left renal atrophy. Fluoroscopy demonstrated a obstructive 1.9 cm stone at the UPJ. 10 French catheter was successfully placed in the collecting system and purulent fluid was removed. Renal collecting system was decompressed at the end of the procedure. IMPRESSION: Successful percutaneous left nephrostomy tube with ultrasound and fluoroscopic guidance. Purulent fluid was removed from the collecting system and sent for culture. Electronically Signed   By: Markus Daft M.D.   On: 01/06/2018 11:46      Scheduled Meds:  Continuous Infusions: . cefTRIAXone (ROCEPHIN)  IV Stopped (01/06/18 2140)     LOS: 1 day    Time spent: 30 minutes   Dessa Phi, DO [facility] [email or web address] Password [facility] 01/07/2018, 3:28 PM

## 2018-01-07 NOTE — Plan of Care (Signed)
  Problem: Clinical Measurements: Goal: Will remain free from infection Outcome: Progressing Goal: Cardiovascular complication will be avoided Outcome: Progressing   Problem: Elimination: Goal: Will not experience complications related to bowel motility Outcome: Progressing Goal: Will not experience complications related to urinary retention Outcome: Progressing   Problem: Skin Integrity: Goal: Risk for impaired skin integrity will decrease Outcome: Progressing

## 2018-01-08 LAB — BASIC METABOLIC PANEL
ANION GAP: 9 (ref 5–15)
BUN: 17 mg/dL (ref 6–20)
CHLORIDE: 109 mmol/L (ref 101–111)
CO2: 23 mmol/L (ref 22–32)
Calcium: 8.9 mg/dL (ref 8.9–10.3)
Creatinine, Ser: 1.36 mg/dL — ABNORMAL HIGH (ref 0.44–1.00)
GFR calc non Af Amer: 37 mL/min — ABNORMAL LOW (ref >60)
GFR, EST AFRICAN AMERICAN: 43 mL/min — AB (ref >60)
Glucose, Bld: 93 mg/dL (ref 65–99)
POTASSIUM: 5 mmol/L (ref 3.5–5.1)
Sodium: 141 mmol/L (ref 135–145)

## 2018-01-08 LAB — CBC
HEMATOCRIT: 33.8 % — AB (ref 36.0–46.0)
Hemoglobin: 10.7 g/dL — ABNORMAL LOW (ref 12.0–15.0)
MCH: 29.8 pg (ref 26.0–34.0)
MCHC: 31.7 g/dL (ref 30.0–36.0)
MCV: 94.2 fL (ref 78.0–100.0)
Platelets: 262 10*3/uL (ref 150–400)
RBC: 3.59 MIL/uL — AB (ref 3.87–5.11)
RDW: 15.4 % (ref 11.5–15.5)
WBC: 6 10*3/uL (ref 4.0–10.5)

## 2018-01-08 MED ORDER — VERAPAMIL HCL ER 180 MG PO TBCR: 180.0000 mg | EXTENDED_RELEASE_TABLET | Freq: Every day | ORAL | Status: DC

## 2018-01-08 MED ORDER — HYDROCHLOROTHIAZIDE 25 MG PO TABS: 25.0000 mg | ORAL_TABLET | Freq: Every morning | ORAL | Status: DC

## 2018-01-08 MED ORDER — CIPROFLOXACIN HCL 500 MG PO TABS: 500.0000 mg | ORAL_TABLET | Freq: Two times a day (BID) | ORAL | 0 refills | Status: DC

## 2018-01-08 MED ORDER — CIPROFLOXACIN HCL 500 MG PO TABS: 500.0000 mg | ORAL_TABLET | Freq: Two times a day (BID) | ORAL | 0 refills | Status: AC

## 2018-01-08 NOTE — Discharge Summary (Signed)
Physician Discharge Summary  Christine Newman PJS:[id number] DOB: 19-Jul-1941 DOA: 01/05/2018  PCP: Mikey Kirschner, MD  Admit date: 01/05/2018 Discharge date: 01/08/2018  Admitted From: Home Disposition:  Home   Recommendations for Outpatient Follow-up:  1. Follow up with PCP in 1 week 2. Follow up with Urology Dr. Alyson Ingles in 2-3 weeks  3. Please follow up on the following pending results: final blood culture result, negative to time of discharge   Discharge Condition: Stable CODE STATUS: Full  Diet recommendation: Heart healthy   Brief/Interim Summary: Christine Barsky Montgomeryis a 77 y.o.femalewith medical history significant ofkidney stones, hypertension comes in with 2 days of worsening left flank pain that is radiating to her left groin. She had spiking fevers up to 103 at home. She is denies any nausea, vomiting, or diarrhea.   Patient was found to have obstructive left renal stone with hydronephrosis.  Urology was consulted.  They recommended IR to place percutaneous nephrostomy tube. She was started on empiric Rocephin. Urine culture from left nephrostomy tube returned positive for citrobacter freundii which was resistant to cefazolin and sensitive to ceftriaxone, cipro. She is related to Cipro on discharge.  Patient will follow up with urology in 2-3 weeks to schedule left percutaneous nephrostolithotomy.   Discharge Diagnoses:  Principal Problem:   Renal stone Active Problems:   Essential hypertension, benign   CKD (chronic kidney disease) stage 3, GFR 30-59 ml/min (HCC)   Generalized anxiety disorder   Hydronephrosis with renal calculous obstruction   Sepsis secondary to obstructive left renal stone with hydronephrosis, pyelonephritis  -Status post percutaneous nephrostomy tube -Urine culture +citrobacter freundii -Blood culture negative to date  -Urology and IR following -Has been afebrile over last 24 hours -IV Rocephin --> Cipro on discharge  -Follow-up with  urology in 2-3 weeks  Essential hypertension -Resume antihypertensives    CKD stage III -Baseline creatinine 1.4 -Stable    Discharge Instructions  Discharge Instructions    Call MD for:  difficulty breathing, headache or visual disturbances   Complete by:  As directed    Call MD for:  extreme fatigue   Complete by:  As directed    Call MD for:  hives   Complete by:  As directed    Call MD for:  persistant dizziness or light-headedness   Complete by:  As directed    Call MD for:  persistant nausea and vomiting   Complete by:  As directed    Call MD for:  severe uncontrolled pain   Complete by:  As directed    Call MD for:  temperature >100.4   Complete by:  As directed    Diet - low sodium heart healthy   Complete by:  As directed    Discharge instructions   Complete by:  As directed    You were cared for by a hospitalist during your hospital stay. If you have any questions about your discharge medications or the care you received while you were in the hospital after you are discharged, you can call the unit and asked to speak with the hospitalist on call if the hospitalist that took care of you is not available. Once you are discharged, your primary care physician will handle any further medical issues. Please note that NO REFILLS for any discharge medications will be authorized once you are discharged, as it is imperative that you return to your primary care physician (or establish a relationship with a primary care physician if you do not have one)  for your aftercare needs so that they can reassess your need for medications and monitor your lab values.   Increase activity slowly   Complete by:  As directed      Allergies as of 01/08/2018   No Known Allergies     Medication List    TAKE these medications   aspirin EC 81 MG tablet Take 81 mg by mouth at bedtime.   ciprofloxacin 500 MG tablet Commonly known as:  CIPRO Take 1 tablet (500 mg total) by mouth 2 (two) times  daily for 10 days.   folic acid 469 MCG tablet Commonly known as:  FOLVITE Take 800 mcg by mouth daily.   hydrochlorothiazide 25 MG tablet Commonly known as:  HYDRODIURIL Take one tablet qam What changed:    how much to take  how to take this  when to take this  additional instructions   Mesalamine 400 MG Cpdr DR capsule Commonly known as:  ASACOL Take 2 capsules (800 mg total) by mouth 3 (three) times daily.   multivitamin with minerals tablet Take 1 tablet by mouth daily.   verapamil 180 MG CR tablet Commonly known as:  CALAN-SR Take one tablet at bedtime What changed:    how much to take  how to take this  when to take this  additional instructions      Follow-up Information    Luking, Grace Bushy, MD. Schedule an appointment as soon as possible for a visit in 1 week(s).   Specialty:  Family Medicine Contact information: [place] [address] [phone]        Cleon Gustin, MD. Schedule an appointment as soon as possible for a visit in 2 week(s).   Specialty:  Urology Contact information: [place]  [address] [phone]          No Known Allergies  Consultations:  Urology  IR    Procedures/Studies: Ct Renal Stone Study  Result Date: 01/05/2018 CLINICAL DATA:  Left flank pain.  History of kidney stones. EXAM: CT ABDOMEN AND PELVIS WITHOUT CONTRAST TECHNIQUE: Multidetector CT imaging of the abdomen and pelvis was performed following the standard protocol without IV contrast. COMPARISON:  CT abdomen pelvis dated September 15, 2017. FINDINGS: Lower chest: No acute abnormality. Hepatobiliary: Unchanged 7 mm subcentimeter hypodense lesion in segment 7. No new focal liver abnormality. Prominent distention of the gallbladder without wall thickening. Mild dilatation of the common bile duct to 9 mm is unchanged. Pancreas: Unremarkable. No pancreatic ductal dilatation or surrounding inflammatory changes.  Spleen: Normal in size without focal abnormality. Adrenals/Urinary Tract: The adrenal glands are unremarkable. The previously seen 1.9 cm calculus on the left has migrated to the UPJ junction, with resultant moderate to severe left hydronephrosis and prominent inflammatory changes surrounding the severely atrophic left kidney. Nonobstructive calculi in the right kidney are unchanged. The bladder is unremarkable. Stomach/Bowel: Postsurgical changes related to interval ileocecectomy with patent anastomosis. Mild to moderate colonic stool burden. Stomach is within normal limits. No bowel wall thickening, distention, or surrounding inflammatory changes. Mild sigmoid diverticulosis. Unchanged periampullary duodenal diverticulum. Unchanged diverticulum at the duodenal jejunal junction. Vascular/Lymphatic: Aortic atherosclerosis. Prominent left retroperitoneal lymph nodes near the left renal hilum are likely reactive. Reproductive: Uterus and bilateral adnexa are unremarkable for the patient's age. Other: No free fluid or pneumoperitoneum. Unchanged stellate band-like mesenteric scarring in the pelvis (series 2, image 71), likely sequelae of inflammatory bowel disease. Musculoskeletal: No acute or significant osseous findings. Stable degenerative  changes of the lumbar spine. IMPRESSION: 1. Interval migration of the large left 1.9 cm renal calculus to the UPJ with resultant moderate to severe left hydronephrosis and prominent left perinephric inflammatory changes. 2. Unchanged nonobstructive right nephrolithiasis. 3. Interval ileocecectomy.  No bowel inflammatory changes. 4. Prominent gallbladder distension without wall thickening or cholelithiasis. Unchanged mild dilatation of the common bile duct to 9 mm. Correlate with LFTs. 5.  Aortic atherosclerosis (ICD10-I70.0). Electronically Signed   By: Titus Dubin M.D.   On: 01/05/2018 16:20   Ir Nephrostomy Placement Left  Result Date: 01/06/2018 INDICATION: 77 year old  with an obstructive for right kidney stone. Patient is febrile and concern for sepsis. EXAM: PERCUTANEOUS LEFT NEPHROSTOMY TUBE PLACEMENT WITH ULTRASOUND AND FLUOROSCOPIC GUIDANCE COMPARISON:  None. MEDICATIONS: IV Rocephin was given prior to the procedure. ANESTHESIA/SEDATION: Fentanyl 50 mcg IV; Versed 1.5 mg IV Moderate Sedation Time:  21 minutes The patient was continuously monitored during the procedure by the interventional radiology nurse under my direct supervision. CONTRAST:  5 mL-administered into the collecting system(s) FLUOROSCOPY TIME:  Fluoroscopy Time: 30 seconds, 3 mGy COMPLICATIONS: None immediate. PROCEDURE: Informed written consent was obtained from the patient after a thorough discussion of the procedural risks, benefits and alternatives. All questions were addressed. Maximal Sterile Barrier Technique was utilized including caps, mask, sterile gowns, sterile gloves, sterile drape, hand hygiene and skin antiseptic. A timeout was performed prior to the initiation of the procedure. Ultrasound was used to identify a dilated left renal collecting system. The left flank was prepped and draped in sterile fashion. Skin was anesthetized with 1% lidocaine. 21 gauge needle was directed into a dilated lower pole calyx with ultrasound guidance. Wire was easily advanced into the collecting system. Accustick dilator set was placed. A 10 [place] multipurpose drain was subsequently placed over a wire. Approximately 20 mL of pink/yellow purulent fluid was removed from the collecting system. Fluid was sent for culture. Small amount of contrast and saline was injected in the collecting system. Catheter was attached to a gravity bag. Catheter was sutured to skin. Fluoroscopic and ultrasound images were taken and saved for documentation. FINDINGS: Severe left hydronephrosis with underlying left renal atrophy. Fluoroscopy demonstrated a obstructive 1.9 cm stone at the UPJ. 10 French catheter was successfully placed in  the collecting system and purulent fluid was removed. Renal collecting system was decompressed at the end of the procedure. IMPRESSION: Successful percutaneous left nephrostomy tube with ultrasound and fluoroscopic guidance. Purulent fluid was removed from the collecting system and sent for culture. Electronically Signed   By: Markus Daft M.D.   On: 01/06/2018 11:46      Discharge Exam: Vitals:   01/07/18 2338 01/08/18 0507  BP:  138/87  Pulse:  82  Resp:  20  Temp: 99.2 F (37.3 C) 98.6 F (37 C)  SpO2:  100%     General: Pt is alert, awake, not in acute distress Cardiovascular: RRR, S1/S2 +, no rubs, no gallops Respiratory: CTA bilaterally, no wheezing, no rhonchi Abdominal: Soft, NT, ND, bowel sounds + GU: +left perc neph tube draining yellow urine  Extremities: no edema, no cyanosis    The results of significant diagnostics from this hospitalization (including imaging, microbiology, ancillary and laboratory) are listed below for reference.     Microbiology: Recent Results (from the past 240 hour(s))  Urine Culture     Status: Abnormal   Collection Time: 01/05/18  2:04 PM  Result Value Ref Range Status   Specimen Description   Final  URINE, CLEAN CATCH Performed at [facility], [address] [place], [place] [address]    Special Requests   Final    Normal Performed at [facility], [address], [place], [place] [address]    Culture MULTIPLE SPECIES PRESENT, SUGGEST RECOLLECTION (A)  Final   Report Status 01/07/2018 FINAL  Final  Aerobic/Anaerobic Culture (surgical/deep wound)     Status: None (Preliminary result)   Collection Time: 01/06/18 12:02 AM  Result Value Ref Range Status   Specimen Description   Final    URINE, CATHETERIZED LEFT NEPHROSTOMY TUBE Performed at [facility] [address], [place], [place] [address]    Special Requests   Final    NONE Performed at [facility], [place] [address], [place],  [place] [address]    Gram Stain   Final    ABUNDANT WBC PRESENT, PREDOMINANTLY PMN RARE GRAM NEGATIVE RODS Performed at [facility], [place] [address], [place], [place] [address]    Culture   Final    ABUNDANT CITROBACTER FREUNDII NO ANAEROBES ISOLATED; CULTURE IN PROGRESS FOR 5 DAYS    Report Status PENDING  Incomplete   Organism ID, Bacteria CITROBACTER FREUNDII  Final      Susceptibility   Citrobacter freundii - MIC*    CEFAZOLIN >=64 RESISTANT Resistant     CEFTRIAXONE <=1 SENSITIVE Sensitive     CIPROFLOXACIN <=0.25 SENSITIVE Sensitive     GENTAMICIN <=1 SENSITIVE Sensitive     IMIPENEM 1 SENSITIVE Sensitive     NITROFURANTOIN <=16 SENSITIVE Sensitive     TRIMETH/SULFA <=20 SENSITIVE Sensitive     PIP/TAZO <=4 SENSITIVE Sensitive     * ABUNDANT CITROBACTER FREUNDII  Culture, blood (routine x 2)     Status: None (Preliminary result)   Collection Time: 01/06/18  4:46 AM  Result Value Ref Range Status   Specimen Description   Final    BLOOD RIGHT ANTECUBITAL Performed at [facility] [address], [place], [place] [address]    Special Requests   Final    BOTTLES DRAWN AEROBIC AND ANAEROBIC Blood Culture adequate volume Performed at [facility] [address], [place], [place] [address]    Culture   Final    NO GROWTH 2 DAYS Performed at [facility] [address] [place], [place] [address]    Report Status PENDING  Incomplete  Culture, blood (routine x 2)     Status: None (Preliminary result)   Collection Time: 01/06/18  4:51 AM  Result Value Ref Range Status   Specimen Description   Final    BLOOD RIGHT HAND Performed at [facility] [address] [place], [place] [address]    Special Requests   Final    BOTTLES DRAWN AEROBIC AND ANAEROBIC Blood Culture adequate volume Performed at [facility] [address], [place], [place] [address]    Culture   Final    NO GROWTH 2  DAYS Performed at [facility] [address] [place], [place] [address]    Report Status PENDING  Incomplete     Labs: BNP (last 3 results) No results for input(s): BNP in the last 8760 hours. Basic Metabolic Panel: Recent Labs  Lab 01/05/18 1450 01/06/18 0451 01/07/18 0453 01/08/18 0449  NA 135 138 139 141  K 3.6 3.7 3.3* 5.0  CL 99* 106 107 109  CO2 25 21* 24 23  GLUCOSE 109* 87 98  93  BUN 20 19 18 17   CREATININE 1.58* 1.59* 1.48* 1.36*  CALCIUM 9.3 8.7* 8.7* 8.9   Liver Function Tests: Recent Labs  Lab 01/05/18 1448  AST 20  ALT 13*  ALKPHOS 62  BILITOT 1.0  PROT 7.1  ALBUMIN 3.4*   No results for input(s): LIPASE, AMYLASE in the last 168 hours. No results for input(s): AMMONIA in the last 168 hours. CBC: Recent Labs  Lab 01/05/18 1450 01/06/18 0451 01/07/18 0453 01/08/18 0605  WBC 12.1* 8.8 7.7 6.0  NEUTROABS 10.8*  --   --   --   HGB 12.1 10.8* 10.2* 10.7*  HCT 39.1 34.0* 33.1* 33.8*  MCV 94.4 95.2 96.2 94.2  PLT 268 240 242 262   Cardiac Enzymes: No results for input(s): CKTOTAL, CKMB, CKMBINDEX, TROPONINI in the last 168 hours. BNP: Invalid input(s): POCBNP CBG: No results for input(s): GLUCAP in the last 168 hours. D-Dimer No results for input(s): DDIMER in the last 72 hours. Hgb A1c No results for input(s): HGBA1C in the last 72 hours. Lipid Profile No results for input(s): CHOL, HDL, LDLCALC, TRIG, CHOLHDL, LDLDIRECT in the last 72 hours. Thyroid function studies No results for input(s): TSH, T4TOTAL, T3FREE, THYROIDAB in the last 72 hours.  Invalid input(s): FREET3 Anemia work up No results for input(s): VITAMINB12, FOLATE, FERRITIN, TIBC, IRON, RETICCTPCT in the last 72 hours. Urinalysis    Component Value Date/Time   COLORURINE YELLOW 01/05/2018 1404   APPEARANCEUR HAZY (A) 01/05/2018 1404   LABSPEC 1.018 01/05/2018 1404   PHURINE 5.0 01/05/2018 1404   GLUCOSEU NEGATIVE 01/05/2018 1404   HGBUR MODERATE (A) 01/05/2018  1404   BILIRUBINUR NEGATIVE 01/05/2018 1404   KETONESUR NEGATIVE 01/05/2018 1404   PROTEINUR NEGATIVE 01/05/2018 1404   UROBILINOGEN 0.2 01/13/2015 1908   NITRITE NEGATIVE 01/05/2018 1404   LEUKOCYTESUR LARGE (A) 01/05/2018 1404   Sepsis Labs Invalid input(s): PROCALCITONIN,  WBC,  LACTICIDVEN Microbiology Recent Results (from the past 240 hour(s))  Urine Culture     Status: Abnormal   Collection Time: 01/05/18  2:04 PM  Result Value Ref Range Status   Specimen Description   Final    URINE, CLEAN CATCH Performed at [facility], [address], [place], [place] [address]    Special Requests   Final    Normal Performed at [facility], [address], [place], [place] [address]    Culture MULTIPLE SPECIES PRESENT, SUGGEST RECOLLECTION (A)  Final   Report Status 01/07/2018 FINAL  Final  Aerobic/Anaerobic Culture (surgical/deep wound)     Status: None (Preliminary result)   Collection Time: 01/06/18 12:02 AM  Result Value Ref Range Status   Specimen Description   Final    URINE, CATHETERIZED LEFT NEPHROSTOMY TUBE Performed at [facility], [place] [address] [place], [place] [address]    Special Requests   Final    NONE Performed at [facility], [place] [address], [place], [place] [address]    Gram Stain   Final    ABUNDANT WBC PRESENT, PREDOMINANTLY PMN RARE GRAM NEGATIVE RODS Performed at [facility], [place] [address], [place],  [address]    Culture   Final    ABUNDANT CITROBACTER FREUNDII NO ANAEROBES ISOLATED; CULTURE IN PROGRESS FOR 5 DAYS    Report Status PENDING  Incomplete   Organism ID, Bacteria CITROBACTER FREUNDII  Final      Susceptibility   Citrobacter freundii - MIC*    CEFAZOLIN >=64 RESISTANT Resistant  CEFTRIAXONE <=1 SENSITIVE Sensitive     CIPROFLOXACIN <=0.25 SENSITIVE Sensitive     GENTAMICIN <=1 SENSITIVE Sensitive     IMIPENEM 1 SENSITIVE Sensitive     NITROFURANTOIN <=16 SENSITIVE Sensitive      TRIMETH/SULFA <=20 SENSITIVE Sensitive     PIP/TAZO <=4 SENSITIVE Sensitive     * ABUNDANT CITROBACTER FREUNDII  Culture, blood (routine x 2)     Status: None (Preliminary result)   Collection Time: 01/06/18  4:46 AM  Result Value Ref Range Status   Specimen Description   Final    BLOOD RIGHT ANTECUBITAL Performed at [facility] [address], [place], [place] [address]    Special Requests   Final    BOTTLES DRAWN AEROBIC AND ANAEROBIC Blood Culture adequate volume Performed at [facility] [address], [place], [place] [address]    Culture   Final    NO GROWTH 2 DAYS Performed at [facility] [address] [place], [place] [address]    Report Status PENDING  Incomplete  Culture, blood (routine x 2)     Status: None (Preliminary result)   Collection Time: 01/06/18  4:51 AM  Result Value Ref Range Status   Specimen Description   Final    BLOOD RIGHT HAND Performed at [facility] [address] [place], [place] [address]    Special Requests   Final    BOTTLES DRAWN AEROBIC AND ANAEROBIC Blood Culture adequate volume Performed at [facility] [address] [place], [place] [address]    Culture   Final    NO GROWTH 2 DAYS Performed at [facility] [address], [place], [place] [address]    Report Status PENDING  Incomplete     Patient was seen and examined on the day of discharge and was found to be in stable condition. Time coordinating discharge: 25 minutes including assessment and coordination of care, as well as examination of the patient.   SIGNED:  Dessa Phi, DO [facility] Pager [phone]  If 7PM-7AM, please contact night-coverage [email or web address] Password [email or web address] 01/08/2018, 1:33 PM

## 2018-01-08 NOTE — Progress Notes (Signed)
Pt discharged from the unit via wheelchair. Discharge instructions were reviewed with pt and family members. No questions or concerns at this time.

## 2018-01-09 ENCOUNTER — Ambulatory Visit: Payer: Medicare Other | Admitting: Gastroenterology

## 2018-01-11 ENCOUNTER — Other Ambulatory Visit: Payer: Self-pay | Admitting: Urology

## 2018-01-11 LAB — AEROBIC/ANAEROBIC CULTURE W GRAM STAIN (SURGICAL/DEEP WOUND)

## 2018-01-11 LAB — CULTURE, BLOOD (ROUTINE X 2)
CULTURE: NO GROWTH
Culture: NO GROWTH
SPECIAL REQUESTS: ADEQUATE
Special Requests: ADEQUATE

## 2018-01-11 LAB — AEROBIC/ANAEROBIC CULTURE (SURGICAL/DEEP WOUND)

## 2018-01-16 ENCOUNTER — Ambulatory Visit: Payer: Medicare Other | Admitting: Family Medicine

## 2018-01-16 ENCOUNTER — Encounter: Payer: Self-pay | Admitting: Family Medicine

## 2018-01-16 VITALS — BP 132/80 | Ht 65.0 in | Wt 144.0 lb

## 2018-01-16 DIAGNOSIS — F411 Generalized anxiety disorder: Secondary | ICD-10-CM | POA: Diagnosis not present

## 2018-01-16 DIAGNOSIS — I10 Essential (primary) hypertension: Secondary | ICD-10-CM | POA: Diagnosis not present

## 2018-01-16 DIAGNOSIS — N183 Chronic kidney disease, stage 3 unspecified: Secondary | ICD-10-CM

## 2018-01-16 MED ORDER — VERAPAMIL HCL ER 180 MG PO TBCR: EXTENDED_RELEASE_TABLET | ORAL | 1 refills | Status: DC

## 2018-01-16 NOTE — Progress Notes (Signed)
   Subjective:    Patient ID: Christine Newman, female    DOB: August 29, 1941, 77 y.o.   MRN: [id number]  HPI pt arrives for surgical clearance for surger on 01/23/18. Having left percutaneous nephrolithotomy. Pt has already stopped all meds except verapamil for the upcoming surgery.   BP numbers have ben good at home    Pt has nephrostomy tube and will be getting nephro lithotomy     Was told to stop the hctz     Blood pressure medicine and blood pressure levels reviewed today with patient. Compliant with blood pressure medicine. States does not miss a dose. No obvious side effects. Blood pressure generally good when checked elsewhere. Watching salt intake.  Patient goes into great length regarding all her recent challenges with kidney stone, and nephrostomy surgery, and nephrostomy tube.  Also discusses challenges with colon surgery secondary to diverticulitis.  Overall abdominal symptoms better now.  Bowel movements better.   Review of Systems No headache, no major weight loss or weight gain, no chest pain no back pain abdominal pain no change in bowel habits complete ROS otherwise negative     Objective:   Physical Exam  Alert and oriented, somewhat anxious appearing vitals reviewed and stable, NAD ENT-TM's and ext canals WNL bilat via otoscopic exam Soft palate, tonsils and post pharynx WNL via oropharyngeal exam Neck-symmetric, no masses; thyroid nonpalpable and nontender Pulmonary-no tachypnea or accessory muscle use; Clear without wheezes via auscultation Card--no abnrml murmurs, rhythm reg and rate WNL Carotid pulses symmetric, without bruits       Assessment & Plan:  Hypertension good control despite stopping the hydrochlorothiazide.  Will hold off on that medication rationale discussed.  Maintain same medicine diet exercise discussed  2.  Inflammatory bowel disease now on Asacol helping.  Recurrent diverticulitis clinically stable at this time discussed.  3.   Nephrostomy tube/facing surgery/should be able to tolerate  4.  Renal insufficiency clinically stable  Greater than 50% of this 25 minute face to face visit was spent in counseling and discussion and coordination of care regarding the above diagnosis/diagnosies

## 2018-01-17 NOTE — Patient Instructions (Signed)
Christine Newman  01/17/2018     @PREFPERIOPPHARMACY @   Your procedure is scheduled on  01/23/2018 .  Report to [facility] at  640   A.M.  Call this number if you have problems the morning of surgery:  [phone]   Remember:  Do not eat food or drink liquids after midnight.  Take these medicines the morning of surgery with A SIP OF WATER  asacol.   Do not wear jewelry, make-up or nail polish.  Do not wear lotions, powders, or perfumes, or deodorant.  Do not shave 48 hours prior to surgery.  Men may shave face and neck.  Do not bring valuables to the hospital.  [facility] is not responsible for any belongings or valuables.  Contacts, dentures or bridgework may not be worn into surgery.  Leave your suitcase in the car.  After surgery it may be brought to your room.  For patients admitted to the hospital, discharge time will be determined by your treatment team.  Patients discharged the day of surgery will not be allowed to drive home.   Name and phone number of your driver:   family Special instructions:  None  Please read over the following fact sheets that you were given. Anesthesia Post-op Instructions and Care and Recovery After Surgery       Percutaneous Nephrolithotomy Percutaneous nephrolithotomy is a procedure to remove kidney stones. Kidney stones are deposits that form inside your kidneys and can cause pain. You may need this procedure if:  You have large kidney stones. Kidney stones that are bigger than 2 cm (0.78 in) wide may require this procedure.  Your kidney stones are oddly shaped.  Other treatments have not been successful in helping the kidney stones to pass.  You have developed an infection due to the kidney stones.  Tell a health care provider about:  Any allergies you have.  All medicines you are taking, including vitamins, herbs, eye drops, creams, and over-the-counter medicines.  Any problems you or family members have  had with anesthetic medicines.  Any blood disorders you have.  Any surgeries you have had.  Any medical conditions you have.  Whether you are pregnant or may be pregnant.  Whether you use any tobacco products, including cigarettes, chewing tobacco, or e-cigarettes. What are the risks? Generally, this is a safe procedure. However, problems may occur, including:  Infection.  Bleeding. This may include blood in your urine.  Allergic reactions to medicines.  Damage to other structures or organs.  Kidney damage.  Holes in the kidney. These often heal on their own.  Numbness or tingling in the affected area.  Sometimes, not all of the kidney stones are able to be removed with this procedure, so you may need a different procedure to remove them. What happens before the procedure?  Follow instructions from your health care provider about eating or drinking restrictions.  Ask your health care provider about: ? Changing or stopping your regular medicines. This is especially important if you are taking diabetes medicines or blood thinners. ? Taking medicines such as aspirin and ibuprofen. These medicines can thin your blood. Do not take these medicines before your procedure if your health care provider instructs you not to.  Plan to have someone take you home after the procedure.  If you go home right after the procedure, plan to have someone with you for 24 hours.  You may have tests, including: ?  Blood tests. ? Urine tests. ? Tests to check how your heart is working.  Ask your health care provider how your surgical site will be marked or identified.  You may be given antibiotic medicine to help prevent infection. What happens during the procedure?  To reduce your risk of infection: ? Your health care team will wash or sanitize their hands. ? Your skin will be washed with soap.  An IV tube will be inserted into one of your veins.  You will be given one or more of the  following: ? A medicine to help you relax (sedative). ? A medicine to numb the area (local anesthetic). ? A medicine to make you fall asleep (general anesthetic). ? A medicine that is injected into your spine to numb the area below and slightly above the injection site (spinal anesthetic). ? A medicine that is injected into an area of your body to numb everything below the injection site (regional anesthetic).  A thin tube (catheter) will be put in your bladder to drain urine during and after the procedure.  Your surgeon will make a small cut (incision) in your lower back.  A tube will be inserted through the incision into your kidney.  Each kidney stone will be removed through this tube. Larger stones may need to be broken up with a high-intensity light beam (laser) or other tools.  If a kidney stone left the kidney, your surgeon will bring it back to the kidney and then remove it through the tube.  After all of the stones have been removed, a kidney drain tube will be put in. This will help to drain any fluid that builds up while your kidney heals.  Part of the incision may be closed with stitches (sutures).  A bandage (dressing) will be placed over the incision area. The procedure may vary among health care providers and hospitals. What happens after the procedure?  Your blood pressure, heart rate, breathing rate, and blood oxygen level will be monitored often until the medicines you were given have worn off.  You may be given medicine for pain.  You will be encouraged to walk. Walking helps to prevent blood clots.  You may be taught breathing exercises.  Do not drive for 24 hours if you received a sedative. This information is not intended to replace advice given to you by your health care provider. Make sure you discuss any questions you have with your health care provider. Document Released: 07/25/2009 Document Revised: 03/04/2016 Document Reviewed: 03/24/2015 Elsevier  Interactive Patient Education  2018 [place].  Percutaneous Nephrolithotomy, Care After Refer to this sheet in the next few weeks. These instructions provide you with information about caring for yourself after your procedure. Your health care provider may also give you more specific instructions. Your treatment has been planned according to current medical practices, but problems sometimes occur. Call your health care provider if you have any problems or questions after your procedure. What can I expect after the procedure? After the procedure, it is common to have:  Soreness or pain.  Fatigue.  Some blood in your urine for a few days.  Follow these instructions at home: Incision care   Follow instructions from your health care provider about how to take care of your cut from surgery (incision). Make sure you: ? Wash your hands with soap and water before you change your bandage (dressing). If soap and water are not available, use hand sanitizer. ? Change your dressing as told by  your health care provider. ? Leave stitches (sutures), skin glue, or adhesive strips in place. These skin closures may need to stay in place for 2 weeks or longer. If adhesive strip edges start to loosen and curl up, you may trim the loose edges. Do not remove adhesive strips completely unless your health care provider tells you to do that.  Check your incision area every day for signs of infection. Check for: ? More redness, swelling, or pain. ? More fluid or blood. ? Warmth. ? Pus or a bad smell. Activity  Avoid strenuous activities for as long as told by your health care provider.  Return to your normal activities as told by your health care provider. Ask your health care provider what activities are safe for you. General instructions  Take over-the-counter and prescription medicines only as told by your health care provider.  Do not drive or operate heavy machinery while taking prescription pain  medicine.  Keep all follow-up visits as told by your health care provider. This is important.  You may have been sent home with a catheter or kidney drain tube. If so, carefully follow your health care provider's instructions on how to take care of your catheter or kidney drain tube. Contact a health care provider if:  You have a fever.  You have more redness, swelling, or pain around your incision.  You have more fluid or blood coming from your incision.  Your incision feels warm to the touch.  You have pus or a bad smell coming from your incision.  You lose your appetite.  You feel nauseous or you vomit. Get help right away if:  You have blood clots in your urine.  You cannot urinate.  You have chest pain or trouble breathing. This information is not intended to replace advice given to you by your health care provider. Make sure you discuss any questions you have with your health care provider. Document Released: 10/24/2015 Document Revised: 03/04/2016 Document Reviewed: 03/17/2015 Elsevier Interactive Patient Education  2018 [place] Anesthesia, Adult General anesthesia is the use of medicines to make a person "go to sleep" (be unconscious) for a medical procedure. General anesthesia is often recommended when a procedure:  Is long.  Requires you to be still or in an unusual position.  Is major and can cause you to lose blood.  Is impossible to do without general anesthesia.  The medicines used for general anesthesia are called general anesthetics. In addition to making you sleep, the medicines:  Prevent pain.  Control your blood pressure.  Relax your muscles.  Tell a health care provider about:  Any allergies you have.  All medicines you are taking, including vitamins, herbs, eye drops, creams, and over-the-counter medicines.  Any problems you or family members have had with anesthetic medicines.  Types of anesthetics you have had in the  past.  Any bleeding disorders you have.  Any surgeries you have had.  Any medical conditions you have.  Any history of heart or lung conditions, such as heart failure, sleep apnea, or chronic obstructive pulmonary disease (COPD).  Whether you are pregnant or may be pregnant.  Whether you use tobacco, alcohol, marijuana, or street drugs.  Any history of Armed forces logistics/support/administrative officer.  Any history of depression or anxiety. What are the risks? Generally, this is a safe procedure. However, problems may occur, including:  Allergic reaction to anesthetics.  Lung and heart problems.  Inhaling food or liquids from your stomach into your lungs (aspiration).  Injury to nerves.  Waking up during your procedure and being unable to move (rare).  Extreme agitation or a state of mental confusion (delirium) when you wake up from the anesthetic.  Air in the bloodstream, which can lead to stroke.  These problems are more likely to develop if you are having a major surgery or if you have an advanced medical condition. You can prevent some of these complications by answering all of your health care provider's questions thoroughly and by following all pre-procedure instructions. General anesthesia can cause side effects, including:  Nausea or vomiting  A sore throat from the breathing tube.  Feeling cold or shivery.  Feeling tired, washed out, or achy.  Sleepiness or drowsiness.  Confusion or agitation.  What happens before the procedure? Staying hydrated Follow instructions from your health care provider about hydration, which may include:  Up to 2 hours before the procedure - you may continue to drink clear liquids, such as water, clear fruit juice, black coffee, and plain tea.  Eating and drinking restrictions Follow instructions from your health care provider about eating and drinking, which may include:  8 hours before the procedure - stop eating heavy meals or foods such as meat, fried  foods, or fatty foods.  6 hours before the procedure - stop eating light meals or foods, such as toast or cereal.  6 hours before the procedure - stop drinking milk or drinks that contain milk.  2 hours before the procedure - stop drinking clear liquids.  Medicines  Ask your health care provider about: ? Changing or stopping your regular medicines. This is especially important if you are taking diabetes medicines or blood thinners. ? Taking medicines such as aspirin and ibuprofen. These medicines can thin your blood. Do not take these medicines before your procedure if your health care provider instructs you not to. ? Taking new dietary supplements or medicines. Do not take these during the week before your procedure unless your health care provider approves them.  If you are told to take a medicine or to continue taking a medicine on the day of the procedure, take the medicine with sips of water. General instructions   Ask if you will be going home the same day, the following day, or after a longer hospital stay. ? Plan to have someone take you home. ? Plan to have someone stay with you for the first 24 hours after you leave the hospital or clinic.  For 3-6 weeks before the procedure, try not to use any tobacco products, such as cigarettes, chewing tobacco, and e-cigarettes.  You may brush your teeth on the morning of the procedure, but make sure to spit out the toothpaste. What happens during the procedure?  You will be given anesthetics through a mask and through an IV tube in one of your veins.  You may receive medicine to help you relax (sedative).  As soon as you are asleep, a breathing tube may be used to help you breathe.  An anesthesia specialist will stay with you throughout the procedure. He or she will help keep you comfortable and safe by continuing to give you medicines and adjusting the amount of medicine that you get. He or she will also watch your blood pressure,  pulse, and oxygen levels to make sure that the anesthetics do not cause any problems.  If a breathing tube was used to help you breathe, it will be removed before you wake up. The procedure may vary among health  care providers and hospitals. What happens after the procedure?  You will wake up, often slowly, after the procedure is complete, usually in a recovery area.  Your blood pressure, heart rate, breathing rate, and blood oxygen level will be monitored until the medicines you were given have worn off.  You may be given medicine to help you calm down if you feel anxious or agitated.  If you will be going home the same day, your health care provider may check to make sure you can stand, drink, and urinate.  Your health care providers will treat your pain and side effects before you go home.  Do not drive for 24 hours if you received a sedative.  You may: ? Feel nauseous and vomit. ? Have a sore throat. ? Have mental slowness. ? Feel cold or shivery. ? Feel sleepy. ? Feel tired. ? Feel sore or achy, even in parts of your body where you did not have surgery. This information is not intended to replace advice given to you by your health care provider. Make sure you discuss any questions you have with your health care provider. Document Released: 01/04/2008 Document Revised: 03/09/2016 Document Reviewed: 09/11/2015 Elsevier Interactive Patient Education  2018 [place] Anesthesia, Adult, Care After These instructions provide you with information about caring for yourself after your procedure. Your health care provider may also give you more specific instructions. Your treatment has been planned according to current medical practices, but problems sometimes occur. Call your health care provider if you have any problems or questions after your procedure. What can I expect after the procedure? After the procedure, it is common to have:  Vomiting.  A sore throat.  Mental  slowness.  It is common to feel:  Nauseous.  Cold or shivery.  Sleepy.  Tired.  Sore or achy, even in parts of your body where you did not have surgery.  Follow these instructions at home: For at least 24 hours after the procedure:  Do not: ? Participate in activities where you could fall or become injured. ? Drive. ? Use heavy machinery. ? Drink alcohol. ? Take sleeping pills or medicines that cause drowsiness. ? Make important decisions or sign legal documents. ? Take care of children on your own.  Rest. Eating and drinking  If you vomit, drink water, juice, or soup when you can drink without vomiting.  Drink enough fluid to keep your urine clear or pale yellow.  Make sure you have little or no nausea before eating solid foods.  Follow the diet recommended by your health care provider. General instructions  Have a responsible adult stay with you until you are awake and alert.  Return to your normal activities as told by your health care provider. Ask your health care provider what activities are safe for you.  Take over-the-counter and prescription medicines only as told by your health care provider.  If you smoke, do not smoke without supervision.  Keep all follow-up visits as told by your health care provider. This is important. Contact a health care provider if:  You continue to have nausea or vomiting at home, and medicines are not helpful.  You cannot drink fluids or start eating again.  You cannot urinate after 8-12 hours.  You develop a skin rash.  You have fever.  You have increasing redness at the site of your procedure. Get help right away if:  You have difficulty breathing.  You have chest pain.  You have unexpected bleeding.  You feel that you are having a life-threatening or urgent problem. This information is not intended to replace advice given to you by your health care provider. Make sure you discuss any questions you have with  your health care provider. Document Released: 01/03/2001 Document Revised: 03/01/2016 Document Reviewed: 09/11/2015 Elsevier Interactive Patient Education  [employer].

## 2018-01-18 ENCOUNTER — Encounter (HOSPITAL_COMMUNITY)
Admission: RE | Admit: 2018-01-18 | Discharge: 2018-01-18 | Disposition: A | Discharge status: Home / Self Care | Payer: Medicare Other | Source: Ambulatory Visit | Attending: Urology | Admitting: Urology

## 2018-01-18 ENCOUNTER — Encounter (HOSPITAL_COMMUNITY): Payer: Self-pay

## 2018-01-18 ENCOUNTER — Other Ambulatory Visit: Payer: Self-pay

## 2018-01-18 DIAGNOSIS — Z01812 Encounter for preprocedural laboratory examination: Secondary | ICD-10-CM | POA: Insufficient documentation

## 2018-01-18 HISTORY — DX: Personal history of urinary calculi: Z87.442

## 2018-01-18 LAB — BASIC METABOLIC PANEL
ANION GAP: 10 (ref 5–15)
BUN: 11 mg/dL (ref 6–20)
CO2: 22 mmol/L (ref 22–32)
Calcium: 9.3 mg/dL (ref 8.9–10.3)
Chloride: 110 mmol/L (ref 101–111)
Creatinine, Ser: 1.87 mg/dL — ABNORMAL HIGH (ref 0.44–1.00)
GFR calc Af Amer: 29 mL/min — ABNORMAL LOW (ref >60)
GFR calc non Af Amer: 25 mL/min — ABNORMAL LOW (ref >60)
GLUCOSE: 104 mg/dL — AB (ref 65–99)
POTASSIUM: 2.7 mmol/L — AB (ref 3.5–5.1)
Sodium: 142 mmol/L (ref 135–145)

## 2018-01-18 NOTE — Pre-Procedure Instructions (Signed)
Dr Currie Paris aware of potassium and we will re check potassium am of surgery.

## 2018-01-23 ENCOUNTER — Encounter (HOSPITAL_COMMUNITY)
Admission: RE | Disposition: A | Discharge status: Home / Self Care | Payer: Self-pay | Source: Ambulatory Visit | Attending: Urology

## 2018-01-23 ENCOUNTER — Encounter (HOSPITAL_COMMUNITY): Payer: Self-pay | Admitting: *Deleted

## 2018-01-23 ENCOUNTER — Other Ambulatory Visit: Payer: Self-pay | Admitting: Urology

## 2018-01-23 ENCOUNTER — Telehealth: Payer: Self-pay | Admitting: Family Medicine

## 2018-01-23 ENCOUNTER — Ambulatory Visit: Payer: Medicare Other | Admitting: Family Medicine

## 2018-01-23 ENCOUNTER — Ambulatory Visit (HOSPITAL_COMMUNITY): Payer: Medicare Other | Admitting: Anesthesiology

## 2018-01-23 ENCOUNTER — Encounter: Payer: Self-pay | Admitting: Family Medicine

## 2018-01-23 ENCOUNTER — Ambulatory Visit (HOSPITAL_COMMUNITY)
Admission: RE | Admit: 2018-01-23 | Discharge: 2018-01-23 | Disposition: A | Discharge status: Home / Self Care | Payer: Medicare Other | Source: Ambulatory Visit | Attending: Urology | Admitting: Urology

## 2018-01-23 VITALS — BP 174/92 | Ht 65.0 in | Wt 150.1 lb

## 2018-01-23 DIAGNOSIS — E876 Hypokalemia: Secondary | ICD-10-CM | POA: Diagnosis not present

## 2018-01-23 DIAGNOSIS — N183 Chronic kidney disease, stage 3 unspecified: Secondary | ICD-10-CM

## 2018-01-23 DIAGNOSIS — Z538 Procedure and treatment not carried out for other reasons: Secondary | ICD-10-CM | POA: Insufficient documentation

## 2018-01-23 DIAGNOSIS — I10 Essential (primary) hypertension: Secondary | ICD-10-CM

## 2018-01-23 SURGERY — CANCELLED PROCEDURE
Laterality: Left

## 2018-01-23 MED ORDER — CEFTRIAXONE SODIUM 2 G IJ SOLR
2.0000 g | INTRAMUSCULAR | Status: DC
  Filled 2018-01-23: qty 20

## 2018-01-23 MED ORDER — POTASSIUM CHLORIDE ER 20 MEQ PO TBCR
EXTENDED_RELEASE_TABLET | ORAL | 0 refills | Status: DC
Start: 2018-01-23 — End: 2018-02-01

## 2018-01-23 MED ORDER — DIATRIZOATE MEGLUMINE 30 % UR SOLN
URETHRAL | Status: AC
  Filled 2018-01-23: qty 100

## 2018-01-23 NOTE — Telephone Encounter (Signed)
Discussed with pt. Pt verbalized understanding and made appt for today with dr Richardson Landry.

## 2018-01-23 NOTE — Telephone Encounter (Signed)
Dr. Noah Delaine sent a staff message to me stating that this patient's potassium is at 2.7 and they are encouraging her to call us today.  Nurses-please connect with the patient.  Have her come in to see Dr. Richardson Landry this afternoon for further evaluation and treatment of her low potassium.  If possible patient to bring all of her medications with her.  If any issues please notify Dr. Richardson Landry

## 2018-01-23 NOTE — Progress Notes (Signed)
   Subjective:    Patient ID: Christine Newman, female    DOB: 02/28/1941, 77 y.o.   MRN: [id number]  HPI Patient is here today to follow up on Hypokalemia.  Normally patient has not had substantial problem with low potassium.  She does have history of chronic renal deficiency.  Up until lately was on blood pressure medication.  Recently in the hospital.  Please see prior notes.  Due to have lithotripsy today but was found to have a very low potassium on presurgical blood work done several days ago.  Patient does note some fatigue and tiredness.  No hx of potassium supplementation  As far as potential etiology, patient recently pt had days of diarrhea  Ekg done today at office visit. Review of Systems No headache, no major weight loss or weight gain, no chest pain no back pain abdominal pain no change in bowel habits complete ROS otherwise negative     Objective:   Physical Exam  Alert and oriented, vitals reviewed and stable, NAD ENT-TM's and ext canals WNL bilat via otoscopic exam Soft palate, tonsils and post pharynx WNL via oropharyngeal exam Neck-symmetric, no masses; thyroid nonpalpable and nontender Pulmonary-no tachypnea or accessory muscle use; Clear without wheezes via auscultation Card--no abnrml murmurs, rhythm reg and rate WNL Carotid pulses symmetric, without bruits EKG normal sinus rhythm.  No significant ST-T changes.      Assessment & Plan:  1 impression hypokalemia.   Severe.  With normal EKG can likely manage as outpatient.  Discussed.  Will initiate rapid oral replenishment.  Will follow potassium closely.  Further recommendations based on subsequent blood work.  Will be somewhat complicated by history of chronic renal insufficiency so need to be careful with potassium supplementthis discussed with patient at length  Greater than 50% of this 25 minute face to face visit was spent in counseling and discussion and coordination of care regarding the above  diagnosis/diagnosies

## 2018-01-23 NOTE — Progress Notes (Signed)
ISTAT results K 2.8 reported to Dr Jimmye Norman and Dr Alyson Ingles.  Procedure cancelled.  Dr Alyson Ingles to call Dr Wolfgang Phoenix to start po potassium.  Patient D/ced home

## 2018-01-24 ENCOUNTER — Other Ambulatory Visit: Payer: Self-pay | Admitting: *Deleted

## 2018-01-24 ENCOUNTER — Other Ambulatory Visit (HOSPITAL_COMMUNITY)
Admission: RE | Admit: 2018-01-24 | Discharge: 2018-01-24 | Disposition: A | Discharge status: Home / Self Care | Payer: Medicare Other | Source: Ambulatory Visit | Attending: Family Medicine | Admitting: Family Medicine

## 2018-01-24 DIAGNOSIS — E876 Hypokalemia: Secondary | ICD-10-CM

## 2018-01-24 LAB — BASIC METABOLIC PANEL
Anion gap: 10 (ref 5–15)
BUN: 15 mg/dL (ref 6–20)
CALCIUM: 9.4 mg/dL (ref 8.9–10.3)
CHLORIDE: 109 mmol/L (ref 101–111)
CO2: 27 mmol/L (ref 22–32)
CREATININE: 1.76 mg/dL — AB (ref 0.44–1.00)
GFR, EST AFRICAN AMERICAN: 31 mL/min — AB (ref >60)
GFR, EST NON AFRICAN AMERICAN: 27 mL/min — AB (ref >60)
Glucose, Bld: 96 mg/dL (ref 65–99)
Potassium: 3.2 mmol/L — ABNORMAL LOW (ref 3.5–5.1)
SODIUM: 146 mmol/L — AB (ref 135–145)

## 2018-01-25 LAB — POCT I-STAT 4, (NA,K, GLUC, HGB,HCT)
Glucose, Bld: 91 mg/dL (ref 65–99)
HEMATOCRIT: 36 % (ref 36.0–46.0)
HEMOGLOBIN: 12.2 g/dL (ref 12.0–15.0)
Potassium: 2.8 mmol/L — ABNORMAL LOW (ref 3.5–5.1)
Sodium: 146 mmol/L — ABNORMAL HIGH (ref 135–145)

## 2018-01-30 ENCOUNTER — Telehealth: Payer: Self-pay | Admitting: Family Medicine

## 2018-01-30 NOTE — Telephone Encounter (Signed)
ok 

## 2018-01-30 NOTE — Telephone Encounter (Signed)
Pt wanted to let Dr. Richardson Landry know that they rescheduled her surgery for May 6th at [place].

## 2018-01-31 LAB — BASIC METABOLIC PANEL
BUN / CREAT RATIO: 12 (ref 12–28)
BUN: 18 mg/dL (ref 8–27)
CHLORIDE: 106 mmol/L (ref 96–106)
CO2: 25 mmol/L (ref 20–29)
CREATININE: 1.53 mg/dL — AB (ref 0.57–1.00)
Calcium: 9.9 mg/dL (ref 8.7–10.3)
GFR calc Af Amer: 38 mL/min/{1.73_m2} — ABNORMAL LOW (ref >59)
GFR calc non Af Amer: 33 mL/min/{1.73_m2} — ABNORMAL LOW (ref >59)
GLUCOSE: 88 mg/dL (ref 65–99)
POTASSIUM: 4.9 mmol/L (ref 3.5–5.2)
SODIUM: 143 mmol/L (ref 134–144)

## 2018-02-01 ENCOUNTER — Other Ambulatory Visit: Payer: Self-pay

## 2018-02-01 DIAGNOSIS — N289 Disorder of kidney and ureter, unspecified: Secondary | ICD-10-CM

## 2018-02-01 MED ORDER — POTASSIUM CHLORIDE ER 20 MEQ PO TBCR: EXTENDED_RELEASE_TABLET | ORAL | 0 refills | Status: DC

## 2018-02-02 ENCOUNTER — Encounter: Payer: Self-pay | Admitting: Family Medicine

## 2018-02-06 ENCOUNTER — Encounter (HOSPITAL_COMMUNITY): Payer: Self-pay

## 2018-02-06 ENCOUNTER — Other Ambulatory Visit: Payer: Self-pay | Admitting: Urology

## 2018-02-06 DIAGNOSIS — N201 Calculus of ureter: Secondary | ICD-10-CM

## 2018-02-07 ENCOUNTER — Encounter (HOSPITAL_COMMUNITY)
Admission: RE | Admit: 2018-02-07 | Discharge: 2018-02-07 | Disposition: A | Discharge status: Home / Self Care | Payer: Medicare Other | Source: Ambulatory Visit | Attending: Urology | Admitting: Urology

## 2018-02-08 ENCOUNTER — Ambulatory Visit (HOSPITAL_COMMUNITY)
Admission: RE | Admit: 2018-02-08 | Discharge: 2018-02-08 | Disposition: A | Discharge status: Home / Self Care | Payer: Medicare Other | Source: Ambulatory Visit | Attending: Urology | Admitting: Urology

## 2018-02-08 DIAGNOSIS — K5710 Diverticulosis of small intestine without perforation or abscess without bleeding: Secondary | ICD-10-CM | POA: Diagnosis not present

## 2018-02-08 DIAGNOSIS — K829 Disease of gallbladder, unspecified: Secondary | ICD-10-CM | POA: Diagnosis not present

## 2018-02-08 DIAGNOSIS — Z01818 Encounter for other preprocedural examination: Secondary | ICD-10-CM | POA: Diagnosis not present

## 2018-02-08 DIAGNOSIS — I2510 Atherosclerotic heart disease of native coronary artery without angina pectoris: Secondary | ICD-10-CM | POA: Diagnosis not present

## 2018-02-08 DIAGNOSIS — I700 Atherosclerosis of aorta: Secondary | ICD-10-CM | POA: Insufficient documentation

## 2018-02-08 DIAGNOSIS — Z936 Other artificial openings of urinary tract status: Secondary | ICD-10-CM | POA: Insufficient documentation

## 2018-02-08 DIAGNOSIS — N201 Calculus of ureter: Secondary | ICD-10-CM

## 2018-02-08 DIAGNOSIS — N200 Calculus of kidney: Secondary | ICD-10-CM | POA: Insufficient documentation

## 2018-02-13 ENCOUNTER — Ambulatory Visit (HOSPITAL_COMMUNITY): Payer: Medicare Other | Admitting: Anesthesiology

## 2018-02-13 ENCOUNTER — Observation Stay (HOSPITAL_COMMUNITY)
Admission: RE | Admit: 2018-02-13 | Discharge: 2018-02-14 | Disposition: A | Discharge status: Home / Self Care | Payer: Medicare Other | Source: Ambulatory Visit | Attending: Urology | Admitting: Urology

## 2018-02-13 ENCOUNTER — Encounter (HOSPITAL_COMMUNITY)
Admission: RE | Disposition: A | Discharge status: Home / Self Care | Payer: Self-pay | Source: Ambulatory Visit | Attending: Urology

## 2018-02-13 ENCOUNTER — Other Ambulatory Visit: Payer: Self-pay

## 2018-02-13 ENCOUNTER — Ambulatory Visit (HOSPITAL_COMMUNITY): Payer: Medicare Other

## 2018-02-13 ENCOUNTER — Encounter (HOSPITAL_COMMUNITY): Payer: Self-pay

## 2018-02-13 DIAGNOSIS — I129 Hypertensive chronic kidney disease with stage 1 through stage 4 chronic kidney disease, or unspecified chronic kidney disease: Secondary | ICD-10-CM | POA: Insufficient documentation

## 2018-02-13 DIAGNOSIS — N200 Calculus of kidney: Secondary | ICD-10-CM | POA: Diagnosis present

## 2018-02-13 DIAGNOSIS — N189 Chronic kidney disease, unspecified: Secondary | ICD-10-CM | POA: Diagnosis not present

## 2018-02-13 DIAGNOSIS — Z87442 Personal history of urinary calculi: Secondary | ICD-10-CM | POA: Insufficient documentation

## 2018-02-13 DIAGNOSIS — Z87891 Personal history of nicotine dependence: Secondary | ICD-10-CM | POA: Insufficient documentation

## 2018-02-13 HISTORY — PX: NEPHROLITHOTOMY: SHX5134

## 2018-02-13 LAB — POCT I-STAT 4, (NA,K, GLUC, HGB,HCT)
GLUCOSE: 90 mg/dL (ref 65–99)
HCT: 39 % (ref 36.0–46.0)
Hemoglobin: 13.3 g/dL (ref 12.0–15.0)
POTASSIUM: 4.5 mmol/L (ref 3.5–5.1)
SODIUM: 142 mmol/L (ref 135–145)

## 2018-02-13 LAB — BASIC METABOLIC PANEL
ANION GAP: 8 (ref 5–15)
BUN: 20 mg/dL (ref 6–20)
CHLORIDE: 105 mmol/L (ref 101–111)
CO2: 26 mmol/L (ref 22–32)
CREATININE: 1.39 mg/dL — AB (ref 0.44–1.00)
Calcium: 9 mg/dL (ref 8.9–10.3)
GFR calc non Af Amer: 36 mL/min — ABNORMAL LOW (ref >60)
GFR, EST AFRICAN AMERICAN: 42 mL/min — AB (ref >60)
Glucose, Bld: 102 mg/dL — ABNORMAL HIGH (ref 65–99)
POTASSIUM: 4.1 mmol/L (ref 3.5–5.1)
SODIUM: 139 mmol/L (ref 135–145)

## 2018-02-13 LAB — CBC
HEMATOCRIT: 37.9 % (ref 36.0–46.0)
HEMOGLOBIN: 11.5 g/dL — AB (ref 12.0–15.0)
MCH: 29.1 pg (ref 26.0–34.0)
MCHC: 30.3 g/dL (ref 30.0–36.0)
MCV: 95.9 fL (ref 78.0–100.0)
Platelets: 187 10*3/uL (ref 150–400)
RBC: 3.95 MIL/uL (ref 3.87–5.11)
RDW: 14.9 % (ref 11.5–15.5)
WBC: 4.8 10*3/uL (ref 4.0–10.5)

## 2018-02-13 SURGERY — NEPHROLITHOTOMY PERCUTANEOUS
Anesthesia: General | Site: Flank | Laterality: Left

## 2018-02-13 MED ORDER — ACETAMINOPHEN 325 MG PO TABS: 650.0000 mg | ORAL_TABLET | ORAL | Status: DC | PRN

## 2018-02-13 MED ORDER — DIPHENHYDRAMINE HCL 50 MG/ML IJ SOLN: 12.5000 mg | Freq: Four times a day (QID) | INTRAMUSCULAR | Status: DC | PRN

## 2018-02-13 MED ORDER — PHENYLEPHRINE HCL 10 MG/ML IJ SOLN
INTRAMUSCULAR | Status: DC | PRN
  Administered 2018-02-13: 40 ug via INTRAVENOUS

## 2018-02-13 MED ORDER — PROPOFOL 10 MG/ML IV BOLUS
INTRAVENOUS | Status: AC
  Filled 2018-02-13: qty 20

## 2018-02-13 MED ORDER — IOPAMIDOL (ISOVUE-300) INJECTION 61%
INTRAVENOUS | Status: AC
  Filled 2018-02-13: qty 50

## 2018-02-13 MED ORDER — PHENAZOPYRIDINE HCL 100 MG PO TABS: 100.0000 mg | ORAL_TABLET | Freq: Three times a day (TID) | ORAL | 0 refills | Status: DC | PRN

## 2018-02-13 MED ORDER — FENTANYL CITRATE (PF) 100 MCG/2ML IJ SOLN
INTRAMUSCULAR | Status: AC
Start: 2018-02-13 — End: ?
  Filled 2018-02-13: qty 2

## 2018-02-13 MED ORDER — FENTANYL CITRATE (PF) 100 MCG/2ML IJ SOLN
INTRAMUSCULAR | Status: AC
  Filled 2018-02-13: qty 2

## 2018-02-13 MED ORDER — DIPHENHYDRAMINE HCL 12.5 MG/5ML PO ELIX: 12.5000 mg | ORAL_SOLUTION | Freq: Four times a day (QID) | ORAL | Status: DC | PRN

## 2018-02-13 MED ORDER — ARTIFICIAL TEARS OPHTHALMIC OINT
TOPICAL_OINTMENT | OPHTHALMIC | Status: AC
  Filled 2018-02-13: qty 7

## 2018-02-13 MED ORDER — OXYCODONE-ACETAMINOPHEN 5-325 MG PO TABS: 1.0000 | ORAL_TABLET | ORAL | 0 refills | Status: DC | PRN

## 2018-02-13 MED ORDER — FENTANYL CITRATE (PF) 100 MCG/2ML IJ SOLN
50.0000 ug | INTRAMUSCULAR | Status: DC | PRN
  Administered 2018-02-13 (×2): 50 ug via INTRAVENOUS

## 2018-02-13 MED ORDER — ONDANSETRON HCL 4 MG/2ML IJ SOLN
INTRAMUSCULAR | Status: DC | PRN
  Administered 2018-02-13: 4 mg via INTRAVENOUS

## 2018-02-13 MED ORDER — SODIUM CHLORIDE 0.9 % IR SOLN
Status: DC | PRN
  Administered 2018-02-13: 3000 mL

## 2018-02-13 MED ORDER — SODIUM CHLORIDE 0.9 % IV SOLN
2.0000 g | INTRAVENOUS | Status: AC
  Administered 2018-02-13: 2 g via INTRAVENOUS
  Filled 2018-02-13: qty 20

## 2018-02-13 MED ORDER — ONDANSETRON HCL 4 MG/2ML IJ SOLN: 4.0000 mg | INTRAMUSCULAR | Status: DC | PRN

## 2018-02-13 MED ORDER — LIDOCAINE HCL (CARDIAC) PF 100 MG/5ML IV SOSY
PREFILLED_SYRINGE | INTRAVENOUS | Status: DC | PRN
  Administered 2018-02-13: 40 mg via INTRAVENOUS

## 2018-02-13 MED ORDER — SODIUM CHLORIDE 0.9 % IV SOLN
INTRAVENOUS | Status: DC
  Administered 2018-02-13 – 2018-02-14 (×3): via INTRAVENOUS

## 2018-02-13 MED ORDER — ROCURONIUM BROMIDE 100 MG/10ML IV SOLN
INTRAVENOUS | Status: DC | PRN
  Administered 2018-02-13: 10 mg via INTRAVENOUS
  Administered 2018-02-13: 40 mg via INTRAVENOUS

## 2018-02-13 MED ORDER — HYOSCYAMINE SULFATE 0.125 MG SL SUBL: 0.1250 mg | SUBLINGUAL_TABLET | SUBLINGUAL | Status: DC | PRN

## 2018-02-13 MED ORDER — LACTATED RINGERS IV SOLN
INTRAVENOUS | Status: DC
  Administered 2018-02-13 (×2): via INTRAVENOUS

## 2018-02-13 MED ORDER — SUCCINYLCHOLINE CHLORIDE 20 MG/ML IJ SOLN
INTRAMUSCULAR | Status: AC
  Filled 2018-02-13: qty 1

## 2018-02-13 MED ORDER — HYDROMORPHONE HCL 1 MG/ML IJ SOLN
0.5000 mg | INTRAMUSCULAR | Status: DC | PRN
  Administered 2018-02-13: 0.5 mg via INTRAVENOUS
  Filled 2018-02-13: qty 0.5

## 2018-02-13 MED ORDER — ONDANSETRON HCL 4 MG/2ML IJ SOLN
INTRAMUSCULAR | Status: AC
  Filled 2018-02-13: qty 4

## 2018-02-13 MED ORDER — SUGAMMADEX SODIUM 200 MG/2ML IV SOLN
INTRAVENOUS | Status: DC | PRN
  Administered 2018-02-13: 130.6 mg via INTRAVENOUS

## 2018-02-13 MED ORDER — PROPOFOL 10 MG/ML IV BOLUS
INTRAVENOUS | Status: DC | PRN
  Administered 2018-02-13: 100 mg via INTRAVENOUS

## 2018-02-13 MED ORDER — OXYCODONE-ACETAMINOPHEN 5-325 MG PO TABS: 1.0000 | ORAL_TABLET | ORAL | Status: DC | PRN

## 2018-02-13 MED ORDER — ZOLPIDEM TARTRATE 5 MG PO TABS: 5.0000 mg | ORAL_TABLET | Freq: Every evening | ORAL | Status: DC | PRN

## 2018-02-13 MED ORDER — FENTANYL CITRATE (PF) 100 MCG/2ML IJ SOLN
INTRAMUSCULAR | Status: DC | PRN
  Administered 2018-02-13 (×2): 50 ug via INTRAVENOUS

## 2018-02-13 MED ORDER — ROCURONIUM BROMIDE 50 MG/5ML IV SOLN
INTRAVENOUS | Status: AC
  Filled 2018-02-13: qty 1

## 2018-02-13 MED ORDER — MIDAZOLAM HCL 5 MG/5ML IJ SOLN
INTRAMUSCULAR | Status: DC | PRN
  Administered 2018-02-13 (×2): 1 mg via INTRAVENOUS

## 2018-02-13 MED ORDER — IOPAMIDOL (ISOVUE-300) INJECTION 61%
INTRAVENOUS | Status: DC | PRN
  Administered 2018-02-13: 50 mL

## 2018-02-13 MED ORDER — EPHEDRINE SULFATE 50 MG/ML IJ SOLN
INTRAMUSCULAR | Status: DC | PRN
  Administered 2018-02-13 (×2): 10 mg via INTRAVENOUS

## 2018-02-13 SURGICAL SUPPLY — 53 items
BAG URINE DRAINAGE (UROLOGICAL SUPPLIES) ×8 IMPLANT
BASKET ZERO TIP NITINOL 2.4FR (BASKET) IMPLANT
BENZOIN TINCTURE PRP APPL 2/3 (GAUZE/BANDAGES/DRESSINGS) ×8 IMPLANT
BLADE SURG 15 STRL LF DISP TIS (BLADE) ×2 IMPLANT
BLADE SURG 15 STRL SS (BLADE) ×2
CATH FOLEY 2W COUNCIL 20FR 5CC (CATHETERS) IMPLANT
CATH FOLEY 2WAY SLVR  5CC 16FR (CATHETERS) ×2
CATH FOLEY 2WAY SLVR  5CC 18FR (CATHETERS) ×2
CATH FOLEY 2WAY SLVR 5CC 16FR (CATHETERS) ×2 IMPLANT
CATH FOLEY 2WAY SLVR 5CC 18FR (CATHETERS) ×2 IMPLANT
CATH IMAGER II 65CM (CATHETERS) ×4 IMPLANT
CATH INTERMIT  6FR 70CM (CATHETERS) ×4 IMPLANT
CATH ROBINSON RED A/P 20FR (CATHETERS) IMPLANT
COVER LIGHT HANDLE STERIS (MISCELLANEOUS) ×8 IMPLANT
DRAPE C-ARM FOLDED MOBILE STRL (DRAPES) ×4 IMPLANT
DRAPE HALF SHEET 40X57 (DRAPES) ×4 IMPLANT
DRAPE LINGEMAN PERC (DRAPES) ×4 IMPLANT
DRAPE SURG IRRIG POUCH 19X23 (DRAPES) ×4 IMPLANT
DRSG PAD ABDOMINAL 8X10 ST (GAUZE/BANDAGES/DRESSINGS) ×8 IMPLANT
DRSG TEGADERM 4X10 (GAUZE/BANDAGES/DRESSINGS) ×8 IMPLANT
FIBER LASER FLEXIVA 200 (UROLOGICAL SUPPLIES) ×4 IMPLANT
FIBER LASER FLEXIVA 365 (UROLOGICAL SUPPLIES) IMPLANT
FIBER LASER FLEXIVA 550 (UROLOGICAL SUPPLIES) IMPLANT
FIBER LASER TRAC TIP (UROLOGICAL SUPPLIES) IMPLANT
GAUZE SPONGE 4X4 12PLY STRL (GAUZE/BANDAGES/DRESSINGS) ×4 IMPLANT
GLOVE BIO SURGEON STRL SZ8 (GLOVE) ×12 IMPLANT
GLOVE BIOGEL PI IND STRL 8 (GLOVE) IMPLANT
GLOVE BIOGEL PI INDICATOR 8 (GLOVE)
GOWN STRL REUS W/TWL LRG LVL3 (GOWN DISPOSABLE) ×8 IMPLANT
GOWN STRL REUS W/TWL XL LVL3 (GOWN DISPOSABLE) ×4 IMPLANT
GUIDEWIRE STR DUAL SENSOR (WIRE) ×8 IMPLANT
KIT TURNOVER KIT A (KITS) ×4 IMPLANT
MANIFOLD NEPTUNE II (INSTRUMENTS) ×4 IMPLANT
NS IRRIG 1000ML POUR BTL (IV SOLUTION) ×4 IMPLANT
PACK CYSTO (CUSTOM PROCEDURE TRAY) ×4 IMPLANT
PAD ABD 8X10 STRL (GAUZE/BANDAGES/DRESSINGS) ×4 IMPLANT
PAD ARMBOARD 7.5X6 YLW CONV (MISCELLANEOUS) ×4 IMPLANT
POSITIONER HEAD PRONE TRACH (MISCELLANEOUS) ×4 IMPLANT
PROBE LITHOCLAST ULTRA 3.8X403 (UROLOGICAL SUPPLIES) ×4 IMPLANT
PROBE PNEUMATIC 1.0MMX570MM (UROLOGICAL SUPPLIES) ×4 IMPLANT
SET BASIN LINEN APH (SET/KITS/TRAYS/PACK) ×4 IMPLANT
SET IRRIG Y TYPE TUR BLADDER L (SET/KITS/TRAYS/PACK) ×4 IMPLANT
SHEATH PEELAWAY SET 9 (SHEATH) IMPLANT
SPONGE GAUZE 4X4 12PLY STER LF (GAUZE/BANDAGES/DRESSINGS) ×4 IMPLANT
SPONGE LAP 4X18 X RAY DECT (DISPOSABLE) ×4 IMPLANT
STENT URET 6FRX26 CONTOUR (STENTS) ×4 IMPLANT
STONE CATCHER W/TUBE ADAPTER (UROLOGICAL SUPPLIES) ×4 IMPLANT
SUT SILK 2 0 SH (SUTURE) ×4 IMPLANT
SYR 10ML LL (SYRINGE) ×4 IMPLANT
SYR 20CC LL (SYRINGE) ×8 IMPLANT
SYR 50ML LL SCALE MARK (SYRINGE) ×4 IMPLANT
TAPE CLOTH SURG 6X10 WHT LF (GAUZE/BANDAGES/DRESSINGS) ×4 IMPLANT
WATER STERILE IRR 1000ML POUR (IV SOLUTION) ×4 IMPLANT

## 2018-02-13 NOTE — Transfer of Care (Signed)
Immediate Anesthesia Transfer of Care Note  Patient: Karlton Lemon  Procedure(s) Performed: NEPHROLITHOTOMY PERCUTANEOUS (Left )  Patient Location: PACU  Anesthesia Type:General  Level of Consciousness: awake, oriented and patient cooperative  Airway & Oxygen Therapy: Patient Spontanous Breathing  Post-op Assessment: Report given to RN and Post -op Vital signs reviewed and stable  Post vital signs: Reviewed and stable  Last Vitals:  Vitals Value Taken Time  BP    Temp    Pulse 106 02/13/2018 11:18 AM  Resp 8 02/13/2018 11:18 AM  SpO2 87 % 02/13/2018 11:18 AM  Vitals shown include unvalidated device data.  Last Pain:  Vitals:   02/13/18 0834  TempSrc: Oral  PainSc: 0-No pain         Complications: No apparent anesthesia complications

## 2018-02-13 NOTE — Anesthesia Preprocedure Evaluation (Addendum)
Anesthesia Evaluation  Patient identified by MRN, date of birth, ID band Patient awake    Reviewed: Allergy & Precautions, H&P , NPO status , Patient's Chart, lab work & pertinent test results  Airway        Dental   Pulmonary neg pulmonary ROS, former smoker,    breath sounds clear to auscultation       Cardiovascular hypertension, On Medications negative cardio ROS  I Rhythm:regular Rate:Normal  Good ET  1.5 miles /d  Cancelled prior for Low K+, repeating today   Neuro/Psych negative neurological ROS  negative psych ROS   GI/Hepatic negative GI ROS, Neg liver ROS,   Endo/Other  negative endocrine ROS  Renal/GU negative Renal ROSCRI     Musculoskeletal   Abdominal   Peds  Hematology   Anesthesia Other Findings   Reproductive/Obstetrics negative OB ROS                             Anesthesia Physical Anesthesia Plan  ASA: II  Anesthesia Plan: General LMA and General   Post-op Pain Management:    Induction:   PONV Risk Score and Plan:   Airway Management Planned: Oral ETT  Additional Equipment:   Intra-op Plan:   Post-operative Plan:   Informed Consent: I have reviewed the patients History and Physical, chart, labs and discussed the procedure including the risks, benefits and alternatives for the proposed anesthesia with the patient or authorized representative who has indicated his/her understanding and acceptance.     Plan Discussed with: CRNA  Anesthesia Plan Comments:       Anesthesia Quick Evaluation

## 2018-02-13 NOTE — Op Note (Signed)
Preoperative diagnosis: Left renal stone  Postoperative diagnosis: Same  Procedure 1.  Left percutaneous nephrostolithotomy for stone greater than 2 cm 2.  Left nephrostogram 3.  Intraoperative fluoroscopy, under 1 hour, with interpretation 4.  Placement of a 6 x 26 double-J ureteral stent. 5.  Left ureteroscopy with lithotripsy and stone extraction 6.  Placement of a 64 French nephrostomy tube 7.  Dilation of percutaneous tract  Attending: Dr. Alyson Ingles  Anesthesia: General  Estimated blood loss: Minimal  Antibiotics: rocephin  Drains: 1.  72 French Foley catheter 2.  6 x 26 left double-J ureteral stent 3.  18 French nephrostomy tube  Specimens: Stone for analysis  Findings: 2cm renal pelvis calculus  Indications: Patient is a 77 year old female with a history of large left renal stones.  After discussing treatment options and decided she was left percutaneous nephrostolithotomy.  Patient already has a nephrostomy tube.  Procedure in detail: Prior to procedure consent was obtained.  Patient was brought to the operating room debridement was done to ensure correct patient, correct procedure, and correct site.  General anesthesia was administered.  A 16 French Foley catheter was in place.  The patient was then placed in the prone position.  His nephrostomy tube and left flank was then prepped and draped in usual sterile fashion.  A nephrostogram was obtained and findings noted above.  Through the nephrostomy tube we then placed a sensor wire.  Sensor wire was coiled in the renal pelvis we then removed the nephrostomy tube.  We then made an incision at the level of the skin and over the wire we then placed a NephroMax dilator.  We dilated the nephrostomy tract to 30 [place] and held this 18 cm of water for 1 minute.  We then  placed the access sheath over the balloon.  The balloon was then deflated.  We then used a rigid nephroscope to perform nephroscopy.  We encountered a large renal  pelvis stone.  We then used LithoClast to fragment the stone in multiple pieces. We then used a stone grasper to remove stone fragments and then sent them for composition analysis.   We then removed the nephroscope and over the sensor wire placed a 6 x 26 double-J ureteral stent.  The wire was then removed and good coil was noted in the renal pelvis under direct vision in the bladder under fluoroscopy.  We then placed a 18 French nephrostomy tube through the sheath into the renal pelvis.  The balloon was inflated with 3cc of contrast.  We then removed the access sheath in and obtain another nephrostogram.  We noted minimal extravasation of contrast.  We then secured the nephrostomy tubes with 0 silks in interrupted fashion.  Dressing was placed over the nephrostomy tube site and this then concluded the procedure was well-tolerated by the patient.  Complications: None  Condition: Stable, extubated, transferred to PACU  Plan: Patient is to be admitted overnight for observation.  Her Foley catheter through the morning.  He is then to be discharged home and followup in 2 days for nephrostomy tube removal

## 2018-02-13 NOTE — Discharge Instructions (Signed)
Percutaneous Nephrolithotomy, Care After Refer to this sheet in the next few weeks. These instructions provide you with information about caring for yourself after your procedure. Your health care provider may also give you more specific instructions. Your treatment has been planned according to current medical practices, but problems sometimes occur. Call your health care provider if you have any problems or questions after your procedure. What can I expect after the procedure? After the procedure, it is common to have:  Soreness or pain.  Fatigue.  Some blood in your urine for a few days.  Follow these instructions at home: Incision care   Follow instructions from your health care provider about how to take care of your cut from surgery (incision). Make sure you: ? Wash your hands with soap and water before you change your bandage (dressing). If soap and water are not available, use hand sanitizer. ? Change your dressing as told by your health care provider. ? Leave stitches (sutures), skin glue, or adhesive strips in place. These skin closures may need to stay in place for 2 weeks or longer. If adhesive strip edges start to loosen and curl up, you may trim the loose edges. Do not remove adhesive strips completely unless your health care provider tells you to do that.  Check your incision area every day for signs of infection. Check for: ? More redness, swelling, or pain. ? More fluid or blood. ? Warmth. ? Pus or a bad smell. Activity  Avoid strenuous activities for as long as told by your health care provider.  Return to your normal activities as told by your health care provider. Ask your health care provider what activities are safe for you. General instructions  Take over-the-counter and prescription medicines only as told by your health care provider.  Do not drive or operate heavy machinery while taking prescription pain medicine.  Keep all follow-up visits as told by your  health care provider. This is important.  You may have been sent home with a catheter or kidney drain tube. If so, carefully follow your health care providers instructions on how to take care of your catheter or kidney drain tube. Contact a health care provider if:  You have a fever.  You have more redness, swelling, or pain around your incision.  You have more fluid or blood coming from your incision.  Your incision feels warm to the touch.  You have pus or a bad smell coming from your incision.  You lose your appetite.  You feel nauseous or you vomit. Get help right away if:  You have blood clots in your urine.  You cannot urinate.  You have chest pain or trouble breathing. This information is not intended to replace advice given to you by your health care provider. Make sure you discuss any questions you have with your health care provider. Document Released: 10/24/2015 Document Revised: 03/04/2016 Document Reviewed: 03/17/2015 Elsevier Interactive Patient Education  2018 [employer].

## 2018-02-13 NOTE — Progress Notes (Signed)
Patient asleep at this time. Will come back when patient is awake to instruct incentive.

## 2018-02-13 NOTE — H&P (Signed)
Urology Admission H&P  Chief Complaint: left renal calculus  History of Present Illness: Christine Newman is a 77yo with a hx of a large let renal calculus who underwent nephrostomy tube placement for sepsis. She now presents for definitive management. No hematuria. No new LUTS. No fevers/chills/sweats  Past Medical History:  Diagnosis Date  . Anal polyp 2005   condyloma acuminatum  . CKD (chronic kidney disease)    Cr 1.9 -09/2014  . Colonic mass   . Diverticulitis    with abscess  . History of kidney stones   . Hypertension    Past Surgical History:  Procedure Laterality Date  . anal polypectomy  01/02/2004   Dr.Rosenbower- anal polyp removed. bx= condyloma acuminatum  . COLONOSCOPY  04/08/2003   Dr.Mann- small nonbleeding internal and external hemorrhoids small polypoid mass at anal verge, normal appearing L colon, transverse colon, R colon including cecum. stenotic ileocecal valve. bx of maa= benign rectal type mucosa with features of mucosal prolapse syndrome  . COLONOSCOPY N/A 04/02/2015   Dr.Rourk- colonic diverticulosis, no evidence of colonic neoplasm. abnormal ileocecal valve with ulceration bx= ulcerated/eroded benign ileocecal valve mucosa  . FLEXIBLE SIGMOIDOSCOPY  11/06/2003   Dr.Mann- small nodular mass at anal verge, bx done. early L side diverticula. bx= benign rectal mucosa with features of mucosal prolapse syndrome. no adenomatous changes or evidence of malignancy identified.   . IR NEPHROSTOMY PLACEMENT LEFT  01/05/2018  . KIDNEY STONE SURGERY    . LAPAROSCOPY N/A 11/02/2017   Procedure: LAPAROSCOPIC ILEOCECECTOMY;  Surgeon: Leighton Ruff, MD;  Location: [facility];  Service: General;  Laterality: N/A;    Home Medications:  Current Facility-Administered Medications  Medication Dose Route Frequency Provider Last Rate Last Dose  . cefTRIAXone (ROCEPHIN) 2 g in sodium chloride 0.9 % 100 mL IVPB  2 g Intravenous 30 min Pre-Op [place], Candee Furbish, MD       Allergies: No  Known Allergies  Family History  Problem Relation Age of Onset  . Hypertension Mother   . Heart disease Father   . Cancer Sister        breast  . Colon cancer Neg Hx    Social History:  reports that she quit smoking about 56 years ago. Her smoking use included cigarettes. She has a 1.25 pack-year smoking history. She has never used smokeless tobacco. She reports that she has current or past drug history. She reports that she does not drink alcohol.  Review of Systems  All other systems reviewed and are negative.   Physical Exam:  Vital signs in last 24 hours:   Physical Exam  Constitutional: She is oriented to person, place, and time. She appears well-developed and well-nourished.  HENT:  Head: Normocephalic and atraumatic.  Eyes: Pupils are equal, round, and reactive to light. EOM are normal.  Neck: Normal range of motion. No thyromegaly present.  Cardiovascular: Normal rate and regular rhythm.  Respiratory: Effort normal. No respiratory distress.  GI: Soft. She exhibits no distension.  Musculoskeletal: Normal range of motion. She exhibits no edema.  Neurological: She is alert and oriented to person, place, and time.  Skin: Skin is warm and dry.  Psychiatric: She has a normal mood and affect. Her behavior is normal. Judgment and thought content normal.    Laboratory Data:  No results found for this or any previous visit (from the past 24 hour(s)). No results found for this or any previous visit (from the past 240 hour(s)). Creatinine: No results for input(s): CREATININE in  the last 168 hours. Baseline Creatinine: unknwon  Impression/Assessment:  76yo with a left renal calculus  Plan:  The risks/benefits/alternatives to Left PCNL was explained to the patient and she understands and wishes to proceed with surgery  Christine Newman 02/13/2018, 8:34 AM

## 2018-02-13 NOTE — Anesthesia Procedure Notes (Signed)
Procedure Name: Intubation Date/Time: 02/13/2018 8:37 AM Performed by: Andree Elk, Amy A, CRNA Pre-anesthesia Checklist: Patient identified, Emergency Drugs available, Suction available, Patient being monitored and Timeout performed Patient Re-evaluated:Patient Re-evaluated prior to induction Oxygen Delivery Method: Circle System Utilized Preoxygenation: Pre-oxygenation with 100% oxygen Induction Type: IV induction Ventilation: Mask ventilation without difficulty Laryngoscope Size: Mac and 3 Grade View: Grade I Tube type: Oral Tube size: 7.0 mm Number of attempts: 1 Airway Equipment and Method: Stylet Placement Confirmation: ETT inserted through vocal cords under direct vision,  positive ETCO2 and breath sounds checked- equal and bilateral Secured at: 21 cm Tube secured with: Tape Dental Injury: Teeth and Oropharynx as per pre-operative assessment

## 2018-02-13 NOTE — Anesthesia Postprocedure Evaluation (Signed)
Anesthesia Post Note  Patient: Christine Newman  Procedure(s) Performed: NEPHROLITHOTOMY PERCUTANEOUS (Left )  Patient location during evaluation: PACU Anesthesia Type: General Level of consciousness: awake and alert, oriented and patient cooperative Pain management: pain level controlled Vital Signs Assessment: post-procedure vital signs reviewed and stable Respiratory status: spontaneous breathing and respiratory function stable Cardiovascular status: stable Postop Assessment: no apparent nausea or vomiting Anesthetic complications: no     Last Vitals:  Vitals:   02/13/18 1117 02/13/18 1130  BP:  135/69  Pulse: 73 66  Resp:  16  Temp: 36.6 C   SpO2: 100% 97%    Last Pain:  Vitals:   02/13/18 1144  TempSrc:   PainSc: Asleep                 Averi Kilty A

## 2018-02-14 ENCOUNTER — Observation Stay (HOSPITAL_COMMUNITY): Payer: Medicare Other

## 2018-02-14 ENCOUNTER — Encounter (HOSPITAL_COMMUNITY): Payer: Self-pay | Admitting: Urology

## 2018-02-14 DIAGNOSIS — N200 Calculus of kidney: Secondary | ICD-10-CM | POA: Diagnosis not present

## 2018-02-14 LAB — BASIC METABOLIC PANEL
Anion gap: 5 (ref 5–15)
BUN: 15 mg/dL (ref 6–20)
CALCIUM: 8.7 mg/dL — AB (ref 8.9–10.3)
CO2: 25 mmol/L (ref 22–32)
CREATININE: 1.23 mg/dL — AB (ref 0.44–1.00)
Chloride: 109 mmol/L (ref 101–111)
GFR calc non Af Amer: 42 mL/min — ABNORMAL LOW (ref >60)
GFR, EST AFRICAN AMERICAN: 48 mL/min — AB (ref >60)
Glucose, Bld: 94 mg/dL (ref 65–99)
Potassium: 3.9 mmol/L (ref 3.5–5.1)
Sodium: 139 mmol/L (ref 135–145)

## 2018-02-14 LAB — CBC
HEMATOCRIT: 34.9 % — AB (ref 36.0–46.0)
Hemoglobin: 10.5 g/dL — ABNORMAL LOW (ref 12.0–15.0)
MCH: 29.4 pg (ref 26.0–34.0)
MCHC: 30.1 g/dL (ref 30.0–36.0)
MCV: 97.8 fL (ref 78.0–100.0)
Platelets: 203 10*3/uL (ref 150–400)
RBC: 3.57 MIL/uL — ABNORMAL LOW (ref 3.87–5.11)
RDW: 14.9 % (ref 11.5–15.5)
WBC: 5.7 10*3/uL (ref 4.0–10.5)

## 2018-02-14 NOTE — Progress Notes (Signed)
Patient discharged home with instructions given on medications,and follow up visits,patient verbalized understanding. Prescriptions sent with patient.IV discontinued,catheter intact. Accompanied by staff to an awaiting vehicle.

## 2018-02-14 NOTE — Discharge Summary (Signed)
Patient ID: Christine Newman MRN: [id number] DOB/AGE: April 16, 1941 77 y.o.  Admit date: 02/13/2018 Discharge date: 02/14/2018  Primary Care Physician:  Mikey Kirschner, MD  Discharge Diagnoses:  Renal stone   Consults:  None   Discharge Medications: Allergies as of 02/14/2018   No Known Allergies     Medication List    TAKE these medications   cholecalciferol 1000 units tablet Commonly known as:  VITAMIN D Take 1,000 Units by mouth daily.   folic acid 665 MCG tablet Commonly known as:  FOLVITE Take 400 mcg by mouth daily.   mesalamine 1.2 g EC tablet Commonly known as:  LIALDA Take 1.2 g by mouth 4 (four) times daily. What changed:  Another medication with the same name was removed. Continue taking this medication, and follow the directions you see here.   multivitamin with minerals tablet Take 1 tablet by mouth daily.   oxyCODONE-acetaminophen 5-325 MG tablet Commonly known as:  PERCOCET Take 1 tablet by mouth every 4 (four) hours as needed for severe pain.   phenazopyridine 100 MG tablet Commonly known as:  PYRIDIUM Take 1 tablet (100 mg total) by mouth 3 (three) times daily as needed for pain.   Potassium Chloride ER 20 MEQ Tbcr Commonly known as:  K-TAB Take one tab every other day   verapamil 180 MG CR tablet Commonly known as:  CALAN-SR Take one tablet at bedtime        Significant Diagnostic Studies:  Ct Renal Stone Study  Result Date: 02/09/2018 CLINICAL DATA:  Renal calculi, left percutaneous nephrostomy tube placed 01/05/2018, planned lithotripsy next week. EXAM: CT ABDOMEN AND PELVIS WITHOUT CONTRAST TECHNIQUE: Multidetector CT imaging of the abdomen and pelvis was performed following the standard protocol without IV contrast. COMPARISON:  01/05/2018 FINDINGS: Lower chest: Right coronary artery atherosclerotic calcification. Hepatobiliary: Mild diffuse gallbladder wall thickening although a portion of this could be due to nondistention. The common  hepatic duct measures up to 1.1 cm and the common bile duct up to 0.8 cm on image 32/5. This extrahepatic biliary dilatation is roughly similar to the 01/05/2018 exam. Hypodense 0.8 by 0.8 cm lesion medially in the right hepatic lobe on image 14/2 common not appreciably changed compared to the recent prior exam, and measuring about 0.8 by 0.6 cm on 01/13/2015, with long-term relative stability suggesting a benign etiology. Pancreas: Mildly prominent dorsal pancreatic duct in the 3-4 mm diameter range in the pancreatic head. Spleen: Unremarkable Adrenals/Urinary Tract: Interval decompression of the left collecting system by a nephrostomy catheter with resolution of the prior hydronephrosis. The oval-shaped calculus previously at the left UPJ has backed up and now is centered in the loop of the catheter in the lower pole collecting system on image 24/2, measuring 1.8 by 1.1 cm. In the right kidney there is a 4 mm nonobstructive mid kidney calculus and a 2 mm nonobstructive lower pole calculus. Adrenal glands unremarkable. Stomach/Bowel: Postoperative findings in the right colon. Large periampullary duodenal diverticulum. Vascular/Lymphatic: Aortoiliac atherosclerotic vascular disease. Reproductive: Unremarkable Other: Stable stellate scarring in the upper pelvis mesentery on image 64/2. Musculoskeletal: Lumbar spondylosis and degenerative disc disease with grade 1 anterolisthesis at L4-5. Suspected foraminal impingement at L3-4, L4-5, and on the left at L5-S1. IMPRESSION: 1. Interval decompression of the left collecting system by percutaneous nephrostomy. The large oval-shaped calculus previously at the left UPJ has backed up in is now centered in the loop of the catheter in the lower pole collecting system. 2. Nonobstructive right nephrolithiasis. 3. Diffuse  gallbladder wall thickening although much of this may be due to nondistention. 4. Other imaging findings of potential clinical significance: Aortic  Atherosclerosis (ICD10-I70.0). Coronary atherosclerosis. Chronic stellate scarring in the upper pelvis mesentery. Multilevel lumbar impingement. Large periampullary duodenal diverticulum. Stable chronic prominence of the extrahepatic biliary system and mildly prominent dorsal pancreatic duct, uncertain etiology. Electronically Signed   By: Van Clines M.D.   On: 02/09/2018 10:07    Brief H and P: For complete details please refer to admission H and P, but in brief pt admitted for perc mgmt of a large renal stone  Hospital Course: Postop benign couse--pt went home on pod 1 w/ neph tube. Active Problems:   Renal calculus   Day of Discharge BP (!) 141/66 (BP Location: Right Arm)   Pulse 71   Temp 98.8 F (37.1 C) (Oral)   Resp 16   Ht 5' 6"  (1.676 m)   Wt 73.7 kg (162 lb 7.7 oz)   LMP  (LMP Unknown)   SpO2 97%   BMI 26.22 kg/m   Results for orders placed or performed during the hospital encounter of 02/13/18 (from the past 24 hour(s))  I-STAT 4, (NA,K, GLUC, HGB,HCT)     Status: None   Collection Time: 02/13/18  8:51 AM  Result Value Ref Range   Sodium 142 135 - 145 mmol/L   Potassium 4.5 3.5 - 5.1 mmol/L   Glucose, Bld 90 65 - 99 mg/dL   HCT 39.0 36.0 - 46.0 %   Hemoglobin 13.3 12.0 - 15.0 g/dL  CBC     Status: Abnormal   Collection Time: 02/13/18  1:04 PM  Result Value Ref Range   WBC 4.8 4.0 - 10.5 K/uL   RBC 3.95 3.87 - 5.11 MIL/uL   Hemoglobin 11.5 (L) 12.0 - 15.0 g/dL   HCT 37.9 36.0 - 46.0 %   MCV 95.9 78.0 - 100.0 fL   MCH 29.1 26.0 - 34.0 pg   MCHC 30.3 30.0 - 36.0 g/dL   RDW 14.9 11.5 - 15.5 %   Platelets 187 150 - 400 K/uL  Basic metabolic panel     Status: Abnormal   Collection Time: 02/13/18  1:04 PM  Result Value Ref Range   Sodium 139 135 - 145 mmol/L   Potassium 4.1 3.5 - 5.1 mmol/L   Chloride 105 101 - 111 mmol/L   CO2 26 22 - 32 mmol/L   Glucose, Bld 102 (H) 65 - 99 mg/dL   BUN 20 6 - 20 mg/dL   Creatinine, Ser 1.39 (H) 0.44 - 1.00 mg/dL    Calcium 9.0 8.9 - 10.3 mg/dL   GFR calc non Af Amer 36 (L) >60 mL/min   GFR calc Af Amer 42 (L) >60 mL/min   Anion gap 8 5 - 15  CBC     Status: Abnormal   Collection Time: 02/14/18  5:27 AM  Result Value Ref Range   WBC 5.7 4.0 - 10.5 K/uL   RBC 3.57 (L) 3.87 - 5.11 MIL/uL   Hemoglobin 10.5 (L) 12.0 - 15.0 g/dL   HCT 34.9 (L) 36.0 - 46.0 %   MCV 97.8 78.0 - 100.0 fL   MCH 29.4 26.0 - 34.0 pg   MCHC 30.1 30.0 - 36.0 g/dL   RDW 14.9 11.5 - 15.5 %   Platelets 203 150 - 400 K/uL  Basic metabolic panel     Status: Abnormal   Collection Time: 02/14/18  5:27 AM  Result Value Ref Range  Sodium 139 135 - 145 mmol/L   Potassium 3.9 3.5 - 5.1 mmol/L   Chloride 109 101 - 111 mmol/L   CO2 25 22 - 32 mmol/L   Glucose, Bld 94 65 - 99 mg/dL   BUN 15 6 - 20 mg/dL   Creatinine, Ser 1.23 (H) 0.44 - 1.00 mg/dL   Calcium 8.7 (L) 8.9 - 10.3 mg/dL   GFR calc non Af Amer 42 (L) >60 mL/min   GFR calc Af Amer 48 (L) >60 mL/min   Anion gap 5 5 - 15    Physical Exam: General: Alert and awake oriented x3 not in any acute distress. HEENT: anicteric sclera, pupils reactive to light and accommodation CVS: S1-S2 clear no murmur rubs or gallops Chest: clear to auscultation bilaterally, no wheezing rales or rhonchi Abdomen: soft nontender, nondistended, normal bowel sounds, no organomegaly Extremities: no cyanosis, clubbing or edema noted bilaterally Neuro: Cranial nerves II-XII intact, no focal neurological deficits  Disposition:  Home  Diet:  Regular  Activity:  Gradually increase   Disposition and Follow-up:    Will followup in office pod2  TESTS THAT NEED FOLLOW-UP    DISCHARGE FOLLOW-UP Follow-up Information    Cleon Gustin, MD. Call on 02/15/2018.   Specialty:  Urology Contact information: [address] [place] [address] [phone]           Time spent on Discharge:  15 mins  Signed: Lillette Boxer Sonnia Strong 02/14/2018, 8:01 AM

## 2018-02-17 ENCOUNTER — Ambulatory Visit (INDEPENDENT_AMBULATORY_CARE_PROVIDER_SITE_OTHER): Payer: Medicare Other | Admitting: Urology

## 2018-02-17 DIAGNOSIS — N200 Calculus of kidney: Secondary | ICD-10-CM

## 2018-02-21 ENCOUNTER — Encounter: Payer: Self-pay | Admitting: Family Medicine

## 2018-02-21 ENCOUNTER — Ambulatory Visit: Payer: Medicare Other | Admitting: Family Medicine

## 2018-02-21 VITALS — BP 170/82 | Ht 65.0 in | Wt 148.0 lb

## 2018-02-21 DIAGNOSIS — E876 Hypokalemia: Secondary | ICD-10-CM | POA: Diagnosis not present

## 2018-02-21 DIAGNOSIS — N183 Chronic kidney disease, stage 3 unspecified: Secondary | ICD-10-CM

## 2018-02-21 DIAGNOSIS — I10 Essential (primary) hypertension: Secondary | ICD-10-CM | POA: Diagnosis not present

## 2018-02-21 MED ORDER — POTASSIUM CHLORIDE ER 20 MEQ PO TBCR: EXTENDED_RELEASE_TABLET | ORAL | 5 refills | Status: DC

## 2018-02-21 MED ORDER — VERAPAMIL HCL ER 240 MG PO TBCR: EXTENDED_RELEASE_TABLET | ORAL | 1 refills | Status: DC

## 2018-02-21 NOTE — Progress Notes (Signed)
   Subjective:    Patient ID: Christine Newman, female    DOB: 12-27-1940, 77 y.o.   MRN: [id number]  HPI  Patient is here today for a hospital follow up after having Nephrolithotomy done on May 6,2019.    Patient arrives post hospitalization for nephrolithotomy.  Overall did well.  Compliant with medications.  Pleasant pressure tends to be elevated at times still.  Blood work reviewed.  Potassium within normal limits on current supplement                      Review of Systems No headache, no major weight loss or weight gain, no chest pain no back pain abdominal pain no change in bowel habits complete ROS otherwise negative     Objective:   Physical Exam Alert vitals stable, NAD. Blood pressure good on repeat. HEENT normal. Lungs clear. Heart regular rate and rhythm.        Assessment & Plan:  1 impression hypertension.  Controlled suboptimal discussed.  Will increase calcium channel blocker #2 hypokalemia resolved maintain potassium supplement rationale discussed #3 status post nephrolithotomy for persistent stone.  Multiple questions answered in this regard.  Complete hospital record reviewed today in presence of patient.  #4 chronic renal insufficiency.  Creatinine clinically stable with current meds discussed  Follow-up in 6 months diet exercise discussed  Greater than 50% of this 25 minute face to face visit was spent in counseling and discussion and coordination of care regarding the above diagnosis/diagnosies

## 2018-02-22 ENCOUNTER — Ambulatory Visit (INDEPENDENT_AMBULATORY_CARE_PROVIDER_SITE_OTHER): Payer: Medicare Other | Admitting: Urology

## 2018-02-22 DIAGNOSIS — N200 Calculus of kidney: Secondary | ICD-10-CM

## 2018-03-02 LAB — BASIC METABOLIC PANEL
BUN/Creatinine Ratio: 12 (ref 12–28)
BUN: 17 mg/dL (ref 8–27)
CO2: 21 mmol/L (ref 20–29)
CREATININE: 1.38 mg/dL — AB (ref 0.57–1.00)
Calcium: 9.8 mg/dL (ref 8.7–10.3)
Chloride: 109 mmol/L — ABNORMAL HIGH (ref 96–106)
GFR calc Af Amer: 43 mL/min/{1.73_m2} — ABNORMAL LOW (ref >59)
GFR calc non Af Amer: 37 mL/min/{1.73_m2} — ABNORMAL LOW (ref >59)
GLUCOSE: 91 mg/dL (ref 65–99)
Potassium: 4.5 mmol/L (ref 3.5–5.2)
SODIUM: 142 mmol/L (ref 134–144)

## 2018-03-30 ENCOUNTER — Telehealth: Payer: Self-pay | Admitting: Family Medicine

## 2018-03-30 ENCOUNTER — Ambulatory Visit (INDEPENDENT_AMBULATORY_CARE_PROVIDER_SITE_OTHER): Payer: Medicare Other | Admitting: Otolaryngology

## 2018-03-30 DIAGNOSIS — H6121 Impacted cerumen, right ear: Secondary | ICD-10-CM

## 2018-03-30 DIAGNOSIS — H903 Sensorineural hearing loss, bilateral: Secondary | ICD-10-CM | POA: Diagnosis not present

## 2018-03-30 NOTE — Telephone Encounter (Signed)
Review screening mammogram results from [facility] in results folder.

## 2018-04-03 ENCOUNTER — Encounter: Payer: Self-pay | Admitting: Anesthesiology

## 2018-04-05 ENCOUNTER — Encounter: Payer: Self-pay | Admitting: Gastroenterology

## 2018-04-05 ENCOUNTER — Ambulatory Visit (HOSPITAL_COMMUNITY)
Admission: RE | Admit: 2018-04-05 | Discharge: 2018-04-05 | Disposition: A | Discharge status: Home / Self Care | Payer: Medicare Other | Source: Ambulatory Visit | Attending: Gastroenterology | Admitting: Gastroenterology

## 2018-04-05 ENCOUNTER — Other Ambulatory Visit (HOSPITAL_COMMUNITY)
Admission: RE | Admit: 2018-04-05 | Discharge: 2018-04-05 | Disposition: A | Discharge status: Home / Self Care | Payer: Medicare Other | Source: Ambulatory Visit | Attending: Gastroenterology | Admitting: Gastroenterology

## 2018-04-05 ENCOUNTER — Ambulatory Visit: Payer: Medicare Other | Admitting: Gastroenterology

## 2018-04-05 DIAGNOSIS — K567 Ileus, unspecified: Secondary | ICD-10-CM | POA: Diagnosis not present

## 2018-04-05 DIAGNOSIS — N200 Calculus of kidney: Secondary | ICD-10-CM | POA: Insufficient documentation

## 2018-04-05 DIAGNOSIS — K5000 Crohn's disease of small intestine without complications: Secondary | ICD-10-CM | POA: Insufficient documentation

## 2018-04-05 DIAGNOSIS — N261 Atrophy of kidney (terminal): Secondary | ICD-10-CM | POA: Insufficient documentation

## 2018-04-05 DIAGNOSIS — K5090 Crohn's disease, unspecified, without complications: Secondary | ICD-10-CM | POA: Insufficient documentation

## 2018-04-05 LAB — CBC WITH DIFFERENTIAL/PLATELET
Basophils Absolute: 0.1 10*3/uL (ref 0.0–0.1)
Basophils Relative: 1 %
EOS ABS: 0.2 10*3/uL (ref 0.0–0.7)
EOS PCT: 4 %
HCT: 40.5 % (ref 36.0–46.0)
HEMOGLOBIN: 12.5 g/dL (ref 12.0–15.0)
LYMPHS ABS: 1.6 10*3/uL (ref 0.7–4.0)
LYMPHS PCT: 33 %
MCH: 29.5 pg (ref 26.0–34.0)
MCHC: 30.9 g/dL (ref 30.0–36.0)
MCV: 95.5 fL (ref 78.0–100.0)
MONOS PCT: 6 %
Monocytes Absolute: 0.3 10*3/uL (ref 0.1–1.0)
Neutro Abs: 2.7 10*3/uL (ref 1.7–7.7)
Neutrophils Relative %: 56 %
Platelets: 216 10*3/uL (ref 150–400)
RBC: 4.24 MIL/uL (ref 3.87–5.11)
RDW: 14.9 % (ref 11.5–15.5)
WBC: 4.8 10*3/uL (ref 4.0–10.5)

## 2018-04-05 LAB — BASIC METABOLIC PANEL WITH GFR
Anion gap: 5 (ref 5–15)
BUN: 16 mg/dL (ref 8–23)
CO2: 24 mmol/L (ref 22–32)
Calcium: 9.1 mg/dL (ref 8.9–10.3)
Chloride: 110 mmol/L (ref 98–111)
Creatinine, Ser: 1.55 mg/dL — ABNORMAL HIGH (ref 0.44–1.00)
GFR calc Af Amer: 37 mL/min — ABNORMAL LOW
GFR calc non Af Amer: 32 mL/min — ABNORMAL LOW
Glucose, Bld: 100 mg/dL — ABNORMAL HIGH (ref 70–99)
Potassium: 4.5 mmol/L (ref 3.5–5.1)
Sodium: 139 mmol/L (ref 135–145)

## 2018-04-05 MED ORDER — IOPAMIDOL (ISOVUE-300) INJECTION 61%: 100.0000 mL | Freq: Once | INTRAVENOUS | Status: DC | PRN

## 2018-04-05 NOTE — Progress Notes (Signed)
Referring Provider: Mikey Kirschner, MD Primary Care Physician:  Mikey Kirschner, MD Primary GI: Dr. Gala Romney   Chief Complaint  Patient presents with  . Abdominal Pain    right    HPI:   Christine Newman is a 77 y.o. female presenting today with a history of complicated diverticulitis in the past with abscess and protracted course of antibiotic therapy. Last colonoscopy 2016. In Jan 2019 she underwent diagnostic laparoscopy with ileocecectomy by Dr. Leighton Ruff after CT findings earlier that year with loop of inflamed small bowel and partial obstructive symptoms. 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 She was started on Asacol at discharge but insurance did not approve this, so she was given Lialda. Recent imaging on file from May 2019 with stable postsurgical changes from ileocecal resection with intact appearing neo ileocolic anastomosis in the right abdomen. No small bowel wall thickening. A few mildly dilated small bowel loops in the right abdomen up to the 4.3 cm diameter without focal small bowel caliber transition.Remnant large bowel is normal caliber with no wall thickening or significant pericolonic fat stranding.  A week and a half ago she bent over and had severe RLQ pain that felt like it ripped. Felt bad for a few days. Had to stand up and hold it. Doesn't bother with laying or sitting. When standing up or movement, can feel it. Has been off Crohn's medication for months. No rectal bleeding. No constipation. Will rarely have diarrhea but sometimes has urgency. Pain is still present and bothers her but not as severe as it was. No fever or chills. No NSAIDs. Only tylenol.    Past Medical History:  Diagnosis Date  . Anal polyp 2005   condyloma acuminatum  . CKD (chronic kidney disease)    Cr 1.9 -09/2014  . Colonic mass   . Diverticulitis    with abscess  . History of kidney stones   . Hypertension     Past Surgical History:  Procedure  Laterality Date  . anal polypectomy  01/02/2004   Dr.Rosenbower- anal polyp removed. bx= condyloma acuminatum  . COLONOSCOPY  04/08/2003   Dr.Mann- small nonbleeding internal and external hemorrhoids small polypoid mass at anal verge, normal appearing L colon, transverse colon, R colon including cecum. stenotic ileocecal valve. bx of maa= benign rectal type mucosa with features of mucosal prolapse syndrome  . COLONOSCOPY N/A 04/02/2015   Dr.Rourk- colonic diverticulosis, no evidence of colonic neoplasm. abnormal ileocecal valve with ulceration bx= ulcerated/eroded benign ileocecal valve mucosa  . FLEXIBLE SIGMOIDOSCOPY  11/06/2003   Dr.Mann- small nodular mass at anal verge, bx done. early L side diverticula. bx= benign rectal mucosa with features of mucosal prolapse syndrome. no adenomatous changes or evidence of malignancy identified.   . IR NEPHROSTOMY PLACEMENT LEFT  01/05/2018  . KIDNEY STONE SURGERY    . LAPAROSCOPY N/A 11/02/2017   Procedure: LAPAROSCOPIC ILEOCECECTOMY;  Surgeon: Leighton Ruff, MD;  Location: [facility];  Service: General;  Laterality: N/A;  . NEPHROLITHOTOMY Left 02/13/2018   Procedure: NEPHROLITHOTOMY PERCUTANEOUS;  Surgeon: Cleon Gustin, MD;  Location: [facility];  Service: Urology;  Laterality: Left;    Current Outpatient Medications  Medication Sig Dispense Refill  . cholecalciferol (VITAMIN D) 1000 units tablet Take 1,000 Units by mouth daily.    . folic acid (FOLVITE) 947 MCG tablet Take 400 mcg by mouth daily.    . Multiple Vitamins-Minerals (MULTIVITAMIN WITH MINERALS) tablet Take 1 tablet by mouth daily.    Marland Kitchen  verapamil (CALAN-SR) 240 MG CR tablet Take one tablet at bedtime 90 tablet 1   No current facility-administered medications for this visit.     Allergies as of 04/05/2018  . (No Known Allergies)    Family History  Problem Relation Age of Onset  . Hypertension Mother   . Heart disease Father   . Cancer Sister        breast  . Colon cancer Neg Hx       Social History   Socioeconomic History  . Marital status: Widowed    Spouse name: Not on file  . Number of children: Not on file  . Years of education: Not on file  . Highest education level: Not on file  Occupational History  . Not on file  Social Needs  . Financial resource strain: Not on file  . Food insecurity:    Worry: Not on file    Inability: Not on file  . Transportation needs:    Medical: Not on file    Non-medical: Not on file  Tobacco Use  . Smoking status: Former Smoker    Packs/day: 0.25    Years: 5.00    Pack years: 1.25    Types: Cigarettes    Last attempt to quit: 01/18/1962    Years since quitting: 56.2  . Smokeless tobacco: Never Used  . Tobacco comment: 1 pack cigarettes per week ; quit in her early 17s   Substance and Sexual Activity  . Alcohol use: No  . Drug use: Never  . Sexual activity: Not Currently    Birth control/protection: Post-menopausal  Lifestyle  . Physical activity:    Days per week: Not on file    Minutes per session: Not on file  . Stress: Not on file  Relationships  . Social connections:    Talks on phone: Not on file    Gets together: Not on file    Attends religious service: Not on file    Active member of club or organization: Not on file    Attends meetings of clubs or organizations: Not on file    Relationship status: Not on file  Other Topics Concern  . Not on file  Social History Narrative  . Not on file    Review of Systems: Gen: Denies fever, chills, anorexia. Denies fatigue, weakness, weight loss.  CV: Denies chest pain, palpitations, syncope, peripheral edema, and claudication. Resp: Denies dyspnea at rest, cough, wheezing, coughing up blood, and pleurisy. GI: see HPI  Derm: Denies rash, itching, dry skin Psych: Denies depression, anxiety, memory loss, confusion. No homicidal or suicidal ideation.  Heme: Denies bruising, bleeding, and enlarged lymph nodes.  Physical Exam: BP (!) 141/91   Pulse 68    Temp (!) 97.1 F (36.2 C) (Oral)   Ht 5' 6"  (1.676 m)   Wt 150 lb 12.8 oz (68.4 kg)   LMP  (LMP Unknown)   BMI 24.34 kg/m  General:   Alert and oriented. No distress noted. Pleasant and cooperative.  Head:  Normocephalic and atraumatic. Eyes:  Conjuctiva clear without scleral icterus. Mouth:  Oral mucosa pink and moist.  Abdomen:  +BS, soft, non-tender and non-distended. No rebound or guarding. No HSM or masses noted. Msk:  Symmetrical without gross deformities. Normal posture. Extremities:  Without edema. Neurologic:  Alert and  oriented x4 Psych:  Alert and cooperative. Normal mood and affect.

## 2018-04-05 NOTE — Progress Notes (Signed)
Please let patient know that she has expected postoperative changes in the right side. There is a stable 5 mm calculus in right kidney, no concerning findings on CT from renal standpoint. I will need to review with radiology further in detail. Mild colonic ileus reported but she is asymptomatic. Her renal function is slightly increased from prior. No leukocytosis, no anemia. I would rather not have her resume a mesalamine due to CKD. We need to consider ileocolonoscopy in a few months to evaluate further. Please let her know I am discussing with Dr. Gala Romney regarding any oral agents currently and need to review with radiology if there is any evidence of active small bowel disease.

## 2018-04-05 NOTE — Patient Instructions (Signed)
We do not have any samples here, so I feel it is best to see what the CT shows first.   In the meantime, I will discuss with Dr. Gala Romney the best way to treat due to your history of chronic kidney disease. The mesalamine agent we choose needs to be one that releases in the small intestine (and we do not have those samples here today). We will work with [employer] and you to pick the best choice.  Please have blood work done today.   Further recommendations to follow!  It was a pleasure to see you today. I strive to create trusting relationships with patients to provide genuine, compassionate, and quality care. I value your feedback. If you receive a survey regarding your visit,  I greatly appreciate you taking time to fill this out.   Annitta Needs, PhD, ANP-BC [facility]

## 2018-04-06 ENCOUNTER — Other Ambulatory Visit: Payer: Self-pay

## 2018-04-06 DIAGNOSIS — K50919 Crohn's disease, unspecified, with unspecified complications: Secondary | ICD-10-CM

## 2018-04-06 NOTE — Progress Notes (Signed)
LMOM to call.

## 2018-04-06 NOTE — Assessment & Plan Note (Addendum)
77 year old female with history of complicated diverticulitis with abscess in the past, and presenting beginning of this year with inflamed small bowel on CT and partial obstructive symptoms, s/p diagnostic laparoscopy with ileocecectomy by Dr. Leighton Ruff. 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 She was prescribed Asacol by Dr. Marcello Moores at discharge, but insurance did not cover this. From what I understand, she was given Lialda. She has been off any therapy for months.   Her only complaint is of new onset RLQ pain a week ago after bending over; this is much improved at time of visit but still underlying. She has absolutely no change in bowel habits, rectal bleeding, no diarrhea, no constipation. Denying any NSAIDs. Stat CT without IV contrast due to CKD ordered, along with blood work. Further recommendations to follow.   As of note, I discussed with patient that we would want to be cautious with future mesalamine dosing due to chronic kidney disease. She has been asymptomatic from a Crohn's standpoint: we will await CT findings. Will also check CRP and sed rate. She will need a colonoscopy at some point in the near future as well.

## 2018-04-06 NOTE — Progress Notes (Signed)
Orders have been faxed to [facility] lab.

## 2018-04-06 NOTE — Progress Notes (Signed)
Pt is aware. She will do the labs and requested we fax orders to [facility] lab.

## 2018-04-07 ENCOUNTER — Other Ambulatory Visit (HOSPITAL_COMMUNITY)
Admission: RE | Admit: 2018-04-07 | Discharge: 2018-04-07 | Disposition: A | Discharge status: Home / Self Care | Payer: Medicare Other | Source: Ambulatory Visit | Attending: Gastroenterology | Admitting: Gastroenterology

## 2018-04-07 DIAGNOSIS — K50919 Crohn's disease, unspecified, with unspecified complications: Secondary | ICD-10-CM | POA: Insufficient documentation

## 2018-04-07 LAB — SEDIMENTATION RATE: Sed Rate: 5 mm/hr (ref 0–22)

## 2018-04-07 LAB — C-REACTIVE PROTEIN

## 2018-04-11 NOTE — Progress Notes (Signed)
I spoke with patient. RLQ resolved. Feels good. No rectal bleeding, diarrhea, or constipation.

## 2018-04-11 NOTE — Progress Notes (Signed)
Spoke with Dr. Barbie Banner. No new inflammation that would indicate an acute process or worsening. She has expected post op changes that one would see on the scan as it heals and "scars" over time. This is improving with scans completed previously. No obvious small bowel inflammation. I have attempted to call patient to inform but had to leave message. Her CRP and sed rate are both normal as well. I will speak with Dr. Gala Romney about any medications in interim, but I feel the best thing to do would be to endoscopically evaluate with biopsies.

## 2018-04-18 ENCOUNTER — Other Ambulatory Visit: Payer: Self-pay | Admitting: Urology

## 2018-04-18 DIAGNOSIS — N200 Calculus of kidney: Secondary | ICD-10-CM

## 2018-04-18 NOTE — Progress Notes (Signed)
Reviewed with Dr. Gala Romney. Recommend ileocolonoscopy in the future. Is patient willing to proceed with this?

## 2018-04-19 ENCOUNTER — Ambulatory Visit: Payer: Medicare Other | Admitting: Urology

## 2018-04-19 ENCOUNTER — Ambulatory Visit (HOSPITAL_COMMUNITY)
Admission: RE | Admit: 2018-04-19 | Discharge: 2018-04-19 | Disposition: A | Discharge status: Home / Self Care | Payer: Medicare Other | Source: Ambulatory Visit | Attending: Urology | Admitting: Urology

## 2018-04-19 DIAGNOSIS — N200 Calculus of kidney: Secondary | ICD-10-CM | POA: Diagnosis present

## 2018-04-19 DIAGNOSIS — N261 Atrophy of kidney (terminal): Secondary | ICD-10-CM | POA: Diagnosis not present

## 2018-04-20 NOTE — Progress Notes (Signed)
It would be good to reassess her colon and look at the ileum, take biopsies. It is not urgent. We could arrange a follow-up with me in the next few weeks to arrange a colonoscopy if she would like to do that.

## 2018-05-17 ENCOUNTER — Encounter: Payer: Self-pay | Admitting: *Deleted

## 2018-05-17 ENCOUNTER — Encounter: Payer: Self-pay | Admitting: Gastroenterology

## 2018-05-17 ENCOUNTER — Ambulatory Visit: Payer: Medicare Other | Admitting: Gastroenterology

## 2018-05-17 ENCOUNTER — Other Ambulatory Visit: Payer: Self-pay | Admitting: *Deleted

## 2018-05-17 VITALS — BP 153/79 | HR 62 | Temp 97.0°F | Ht 66.0 in | Wt 154.8 lb

## 2018-05-17 DIAGNOSIS — K5000 Crohn's disease of small intestine without complications: Secondary | ICD-10-CM

## 2018-05-17 MED ORDER — PEG 3350-KCL-NA BICARB-NACL 420 G PO SOLR: 4000.0000 mL | Freq: Once | ORAL | 0 refills | Status: AC

## 2018-05-17 NOTE — H&P (View-Only) (Signed)
Referring Provider: Mikey Kirschner, MD Primary Care Physician:  Mikey Kirschner, MD  Chief Complaint  Patient presents with  . Crohn's Disease    wants to discuss TCS    HPI:   Christine Newman is a 77 year old female with history of complicated diverticulitis with abscess in the past, and presenting beginning of this year with inflamed small bowel on CT and partial obstructive symptoms, s/p diagnostic laparoscopy with ileocecectomy by Dr. Leighton Ruff. 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 She was prescribed Asacol by Dr. Marcello Moores at discharge, but insurance did not cover this. From what I understand, she was given Lialda. She has been off any therapy for months. Due to RLQ pain at last visit, CT completed. No definite acute pathology involving the bowel. Sed rate and CRP normal. Here to discuss colonoscopy. Last colonoscopy in 2016. She is on no medication for Crohn's disease and remains asymptomatic.    Has gas mainly at night. Doesn't notice during the day. Doesn't normally eat lunch. Not painful. No rectal bleeding. No weight loss. Has to make herself eat. No NSAIDs.   Past Medical History:  Diagnosis Date  . Anal polyp 2005   condyloma acuminatum  . CKD (chronic kidney disease)    Cr 1.9 -09/2014  . Colonic mass   . Diverticulitis    with abscess  . History of kidney stones   . Hypertension     Past Surgical History:  Procedure Laterality Date  . anal polypectomy  01/02/2004   Dr.Rosenbower- anal polyp removed. bx= condyloma acuminatum  . COLONOSCOPY  04/08/2003   Dr.Mann- small nonbleeding internal and external hemorrhoids small polypoid mass at anal verge, normal appearing L colon, transverse colon, R colon including cecum. stenotic ileocecal valve. bx of maa= benign rectal type mucosa with features of mucosal prolapse syndrome  . COLONOSCOPY N/A 04/02/2015   Dr.Rourk- colonic diverticulosis, no evidence of colonic neoplasm. abnormal  ileocecal valve with ulceration bx= ulcerated/eroded benign ileocecal valve mucosa  . FLEXIBLE SIGMOIDOSCOPY  11/06/2003   Dr.Mann- small nodular mass at anal verge, bx done. early L side diverticula. bx= benign rectal mucosa with features of mucosal prolapse syndrome. no adenomatous changes or evidence of malignancy identified.   . IR NEPHROSTOMY PLACEMENT LEFT  01/05/2018  . KIDNEY STONE SURGERY    . LAPAROSCOPY N/A 11/02/2017   Procedure: LAPAROSCOPIC ILEOCECECTOMY;  Surgeon: Leighton Ruff, MD;  Location: [facility];  Service: General;  Laterality: N/A;  . NEPHROLITHOTOMY Left 02/13/2018   Procedure: NEPHROLITHOTOMY PERCUTANEOUS;  Surgeon: Cleon Gustin, MD;  Location: [facility];  Service: Urology;  Laterality: Left;    Current Outpatient Medications  Medication Sig Dispense Refill  . cholecalciferol (VITAMIN D) 1000 units tablet Take 1,000 Units by mouth daily.    . folic acid (FOLVITE) 623 MCG tablet Take 400 mcg by mouth daily.    . Multiple Vitamins-Minerals (MULTIVITAMIN WITH MINERALS) tablet Take 1 tablet by mouth daily.    . verapamil (CALAN-SR) 240 MG CR tablet Take one tablet at bedtime 90 tablet 1   No current facility-administered medications for this visit.     Allergies as of 05/17/2018  . (No Known Allergies)    Family History  Problem Relation Age of Onset  . Hypertension Mother   . Heart disease Father   . Cancer Sister        breast  . Colon cancer Neg Hx   . Colon polyps Neg Hx  Social History   Socioeconomic History  . Marital status: Widowed    Spouse name: Not on file  . Number of children: Not on file  . Years of education: Not on file  . Highest education level: Not on file  Occupational History  . Not on file  Social Needs  . Financial resource strain: Not on file  . Food insecurity:    Worry: Not on file    Inability: Not on file  . Transportation needs:    Medical: Not on file    Non-medical: Not on file  Tobacco Use  . Smoking status:  Former Smoker    Packs/day: 0.25    Years: 5.00    Pack years: 1.25    Types: Cigarettes    Last attempt to quit: 01/18/1962    Years since quitting: 56.3  . Smokeless tobacco: Never Used  . Tobacco comment: 1 pack cigarettes per week ; quit in her early 33s   Substance and Sexual Activity  . Alcohol use: No  . Drug use: Never  . Sexual activity: Not Currently    Birth control/protection: Post-menopausal  Lifestyle  . Physical activity:    Days per week: Not on file    Minutes per session: Not on file  . Stress: Not on file  Relationships  . Social connections:    Talks on phone: Not on file    Gets together: Not on file    Attends religious service: Not on file    Active member of club or organization: Not on file    Attends meetings of clubs or organizations: Not on file    Relationship status: Not on file  Other Topics Concern  . Not on file  Social History Narrative  . Not on file    Review of Systems: Gen: Denies fever, chills, anorexia. Denies fatigue, weakness, weight loss.  CV: Denies chest pain, palpitations, syncope, peripheral edema, and claudication. Resp: Denies dyspnea at rest, cough, wheezing, coughing up blood, and pleurisy. GI: see HPI  Derm: Denies rash, itching, dry skin Psych: Denies depression, anxiety, memory loss, confusion. No homicidal or suicidal ideation.  Heme: Denies bruising, bleeding, and enlarged lymph nodes.  Physical Exam: BP (!) 153/79   Pulse 62   Temp (!) 97 F (36.1 C) (Oral)   Ht 5' 6"  (1.676 m)   Wt 154 lb 12.8 oz (70.2 kg)   LMP  (LMP Unknown)   BMI 24.99 kg/m  General:   Alert and oriented. No distress noted. Pleasant and cooperative.  Head:  Normocephalic and atraumatic. Eyes:  Conjuctiva clear without scleral icterus. Mouth:  Oral mucosa pink and moist.  Cardiac: S1 S2 present  Lungs: clear to auscultation bilaterally  Abdomen:  +BS, soft, non-tender and non-distended. No rebound or guarding. No HSM or masses  noted. Msk:  Symmetrical without gross deformities. Normal posture. Extremities:  Without edema. Neurologic:  Alert and  oriented x4 Psych:  Alert and cooperative. Normal mood and affect.  Lab Results  Component Value Date   WBC 4.8 04/05/2018   HGB 12.5 04/05/2018   HCT 40.5 04/05/2018   MCV 95.5 04/05/2018   PLT 216 04/05/2018   Lab Results  Component Value Date   CREATININE 1.55 (H) 04/05/2018   BUN 16 04/05/2018   NA 139 04/05/2018   K 4.5 04/05/2018   CL 110 04/05/2018   CO2 24 04/05/2018   Lab Results  Component Value Date   CRP <0.8 04/07/2018   Lab Results  Component Value Date   ESRSEDRATE 5 04/07/2018

## 2018-05-17 NOTE — Patient Instructions (Signed)
I have ordered the lab to be done 3 days before the colonoscopy so we can make sure your potassium is stable.  We have arranged the colonoscopy with Dr. Gala Romney in the near future.  Please call me with any questions!  Enjoy date night with your son!  I will see you in 4 months.  It was a pleasure to see you today. I strive to create trusting relationships with patients to provide genuine, compassionate, and quality care. I value your feedback. If you receive a survey regarding your visit,  I greatly appreciate you taking time to fill this out.   Annitta Needs, PhD, ANP-BC [facility]

## 2018-05-17 NOTE — Progress Notes (Signed)
Referring Provider: Mikey Kirschner, MD Primary Care Physician:  Mikey Kirschner, MD  Chief Complaint  Patient presents with  . Crohn's Disease    wants to discuss TCS    HPI:   Christine Newman is a 77 year old female with history of complicated diverticulitis with abscess in the past, and presenting beginning of this year with inflamed small bowel on CT and partial obstructive symptoms, s/p diagnostic laparoscopy with ileocecectomy by Dr. Leighton Ruff. 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 She was prescribed Asacol by Dr. Marcello Moores at discharge, but insurance did not cover this. From what I understand, she was given Lialda. She has been off any therapy for months. Due to RLQ pain at last visit, CT completed. No definite acute pathology involving the bowel. Sed rate and CRP normal. Here to discuss colonoscopy. Last colonoscopy in 2016. She is on no medication for Crohn's disease and remains asymptomatic.    Has gas mainly at night. Doesn't notice during the day. Doesn't normally eat lunch. Not painful. No rectal bleeding. No weight loss. Has to make herself eat. No NSAIDs.   Past Medical History:  Diagnosis Date  . Anal polyp 2005   condyloma acuminatum  . CKD (chronic kidney disease)    Cr 1.9 -09/2014  . Colonic mass   . Diverticulitis    with abscess  . History of kidney stones   . Hypertension     Past Surgical History:  Procedure Laterality Date  . anal polypectomy  01/02/2004   Dr.Rosenbower- anal polyp removed. bx= condyloma acuminatum  . COLONOSCOPY  04/08/2003   Dr.Mann- small nonbleeding internal and external hemorrhoids small polypoid mass at anal verge, normal appearing L colon, transverse colon, R colon including cecum. stenotic ileocecal valve. bx of maa= benign rectal type mucosa with features of mucosal prolapse syndrome  . COLONOSCOPY N/A 04/02/2015   Dr.Rourk- colonic diverticulosis, no evidence of colonic neoplasm. abnormal  ileocecal valve with ulceration bx= ulcerated/eroded benign ileocecal valve mucosa  . FLEXIBLE SIGMOIDOSCOPY  11/06/2003   Dr.Mann- small nodular mass at anal verge, bx done. early L side diverticula. bx= benign rectal mucosa with features of mucosal prolapse syndrome. no adenomatous changes or evidence of malignancy identified.   . IR NEPHROSTOMY PLACEMENT LEFT  01/05/2018  . KIDNEY STONE SURGERY    . LAPAROSCOPY N/A 11/02/2017   Procedure: LAPAROSCOPIC ILEOCECECTOMY;  Surgeon: Leighton Ruff, MD;  Location: [facility];  Service: General;  Laterality: N/A;  . NEPHROLITHOTOMY Left 02/13/2018   Procedure: NEPHROLITHOTOMY PERCUTANEOUS;  Surgeon: Cleon Gustin, MD;  Location: [facility];  Service: Urology;  Laterality: Left;    Current Outpatient Medications  Medication Sig Dispense Refill  . cholecalciferol (VITAMIN D) 1000 units tablet Take 1,000 Units by mouth daily.    . folic acid (FOLVITE) 734 MCG tablet Take 400 mcg by mouth daily.    . Multiple Vitamins-Minerals (MULTIVITAMIN WITH MINERALS) tablet Take 1 tablet by mouth daily.    . verapamil (CALAN-SR) 240 MG CR tablet Take one tablet at bedtime 90 tablet 1   No current facility-administered medications for this visit.     Allergies as of 05/17/2018  . (No Known Allergies)    Family History  Problem Relation Age of Onset  . Hypertension Mother   . Heart disease Father   . Cancer Sister        breast  . Colon cancer Neg Hx   . Colon polyps Neg Hx  Social History   Socioeconomic History  . Marital status: Widowed    Spouse name: Not on file  . Number of children: Not on file  . Years of education: Not on file  . Highest education level: Not on file  Occupational History  . Not on file  Social Needs  . Financial resource strain: Not on file  . Food insecurity:    Worry: Not on file    Inability: Not on file  . Transportation needs:    Medical: Not on file    Non-medical: Not on file  Tobacco Use  . Smoking status:  Former Smoker    Packs/day: 0.25    Years: 5.00    Pack years: 1.25    Types: Cigarettes    Last attempt to quit: 01/18/1962    Years since quitting: 56.3  . Smokeless tobacco: Never Used  . Tobacco comment: 1 pack cigarettes per week ; quit in her early 46s   Substance and Sexual Activity  . Alcohol use: No  . Drug use: Never  . Sexual activity: Not Currently    Birth control/protection: Post-menopausal  Lifestyle  . Physical activity:    Days per week: Not on file    Minutes per session: Not on file  . Stress: Not on file  Relationships  . Social connections:    Talks on phone: Not on file    Gets together: Not on file    Attends religious service: Not on file    Active member of club or organization: Not on file    Attends meetings of clubs or organizations: Not on file    Relationship status: Not on file  Other Topics Concern  . Not on file  Social History Narrative  . Not on file    Review of Systems: Gen: Denies fever, chills, anorexia. Denies fatigue, weakness, weight loss.  CV: Denies chest pain, palpitations, syncope, peripheral edema, and claudication. Resp: Denies dyspnea at rest, cough, wheezing, coughing up blood, and pleurisy. GI: see HPI  Derm: Denies rash, itching, dry skin Psych: Denies depression, anxiety, memory loss, confusion. No homicidal or suicidal ideation.  Heme: Denies bruising, bleeding, and enlarged lymph nodes.  Physical Exam: BP (!) 153/79   Pulse 62   Temp (!) 97 F (36.1 C) (Oral)   Ht 5' 6"  (1.676 m)   Wt 154 lb 12.8 oz (70.2 kg)   LMP  (LMP Unknown)   BMI 24.99 kg/m  General:   Alert and oriented. No distress noted. Pleasant and cooperative.  Head:  Normocephalic and atraumatic. Eyes:  Conjuctiva clear without scleral icterus. Mouth:  Oral mucosa pink and moist.  Cardiac: S1 S2 present  Lungs: clear to auscultation bilaterally  Abdomen:  +BS, soft, non-tender and non-distended. No rebound or guarding. No HSM or masses  noted. Msk:  Symmetrical without gross deformities. Normal posture. Extremities:  Without edema. Neurologic:  Alert and  oriented x4 Psych:  Alert and cooperative. Normal mood and affect.  Lab Results  Component Value Date   WBC 4.8 04/05/2018   HGB 12.5 04/05/2018   HCT 40.5 04/05/2018   MCV 95.5 04/05/2018   PLT 216 04/05/2018   Lab Results  Component Value Date   CREATININE 1.55 (H) 04/05/2018   BUN 16 04/05/2018   NA 139 04/05/2018   K 4.5 04/05/2018   CL 110 04/05/2018   CO2 24 04/05/2018   Lab Results  Component Value Date   CRP <0.8 04/07/2018   Lab Results  Component Value Date   ESRSEDRATE 5 04/07/2018

## 2018-05-18 ENCOUNTER — Other Ambulatory Visit: Payer: Self-pay | Admitting: Family Medicine

## 2018-05-18 ENCOUNTER — Other Ambulatory Visit: Payer: Self-pay

## 2018-05-18 MED ORDER — VERAPAMIL HCL ER 240 MG PO TBCR: EXTENDED_RELEASE_TABLET | ORAL | 1 refills | Status: DC

## 2018-05-22 NOTE — Assessment & Plan Note (Signed)
77 year old female with history of complicated diverticulitis with abscess in the remote past, presenting beginning of this year with inflamed small bowel on CT and partial obstructive symptoms, s/p diagnostic laparoscopy with ileocecectomy by Dr. Leighton Ruff. 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 Currently, she has been on no therapy and is asymptomatic. Last colonoscopy in 2016. CT done at last visit due to vague RLQ pain, which was likely musculoskeletal in nature as now resolved and CT unrevealing. No NSAID use. Will pursue colonoscopy with ileal biopsies for further evaluation. Clinically, she is asymptomatic. Sed rate and CRP normal recently.   Proceed with TCS with Dr. Gala Romney in near future: the risks, benefits, and alternatives have been discussed with the patient in detail. The patient states understanding and desires to proceed. Follow-up in 4 months.

## 2018-05-23 NOTE — Progress Notes (Signed)
CC'ED TO PCP 

## 2018-05-31 ENCOUNTER — Ambulatory Visit: Payer: Medicare Other | Admitting: Urology

## 2018-05-31 DIAGNOSIS — N200 Calculus of kidney: Secondary | ICD-10-CM | POA: Diagnosis not present

## 2018-06-09 LAB — BASIC METABOLIC PANEL WITH GFR
BUN / CREAT RATIO: 13 (calc) (ref 6–22)
BUN: 19 mg/dL (ref 7–25)
CHLORIDE: 105 mmol/L (ref 98–110)
CO2: 29 mmol/L (ref 20–32)
Calcium: 9.9 mg/dL (ref 8.6–10.4)
Creat: 1.51 mg/dL — ABNORMAL HIGH (ref 0.60–0.93)
GFR, EST AFRICAN AMERICAN: 39 mL/min/{1.73_m2} — AB (ref >=60)
GFR, Est Non African American: 33 mL/min/{1.73_m2} — ABNORMAL LOW (ref >=60)
GLUCOSE: 84 mg/dL (ref 65–139)
POTASSIUM: 4.3 mmol/L (ref 3.5–5.3)
Sodium: 142 mmol/L (ref 135–146)

## 2018-06-13 NOTE — Progress Notes (Signed)
Potassium is normal at 4.3. Patient had wanted this checked prior to colonoscopy. Patient is aware.

## 2018-06-14 ENCOUNTER — Ambulatory Visit (HOSPITAL_COMMUNITY)
Admission: RE | Admit: 2018-06-14 | Discharge: 2018-06-14 | Disposition: A | Discharge status: Home / Self Care | Payer: Medicare Other | Source: Ambulatory Visit | Attending: Internal Medicine | Admitting: Internal Medicine

## 2018-06-14 ENCOUNTER — Encounter (HOSPITAL_COMMUNITY)
Admission: RE | Disposition: A | Discharge status: Home / Self Care | Payer: Self-pay | Source: Ambulatory Visit | Attending: Internal Medicine

## 2018-06-14 ENCOUNTER — Other Ambulatory Visit: Payer: Self-pay

## 2018-06-14 ENCOUNTER — Encounter (HOSPITAL_COMMUNITY): Payer: Self-pay | Admitting: *Deleted

## 2018-06-14 DIAGNOSIS — Z1211 Encounter for screening for malignant neoplasm of colon: Secondary | ICD-10-CM | POA: Insufficient documentation

## 2018-06-14 DIAGNOSIS — Z87891 Personal history of nicotine dependence: Secondary | ICD-10-CM | POA: Insufficient documentation

## 2018-06-14 DIAGNOSIS — Z980 Intestinal bypass and anastomosis status: Secondary | ICD-10-CM | POA: Diagnosis not present

## 2018-06-14 DIAGNOSIS — N189 Chronic kidney disease, unspecified: Secondary | ICD-10-CM | POA: Insufficient documentation

## 2018-06-14 DIAGNOSIS — K5090 Crohn's disease, unspecified, without complications: Secondary | ICD-10-CM | POA: Diagnosis not present

## 2018-06-14 DIAGNOSIS — K5000 Crohn's disease of small intestine without complications: Secondary | ICD-10-CM | POA: Diagnosis not present

## 2018-06-14 DIAGNOSIS — K5730 Diverticulosis of large intestine without perforation or abscess without bleeding: Secondary | ICD-10-CM | POA: Diagnosis not present

## 2018-06-14 DIAGNOSIS — I129 Hypertensive chronic kidney disease with stage 1 through stage 4 chronic kidney disease, or unspecified chronic kidney disease: Secondary | ICD-10-CM | POA: Insufficient documentation

## 2018-06-14 DIAGNOSIS — Z8719 Personal history of other diseases of the digestive system: Secondary | ICD-10-CM | POA: Diagnosis not present

## 2018-06-14 DIAGNOSIS — K648 Other hemorrhoids: Secondary | ICD-10-CM | POA: Diagnosis not present

## 2018-06-14 DIAGNOSIS — Z79899 Other long term (current) drug therapy: Secondary | ICD-10-CM | POA: Diagnosis not present

## 2018-06-14 HISTORY — PX: COLONOSCOPY: SHX5424

## 2018-06-14 SURGERY — COLONOSCOPY
Anesthesia: Moderate Sedation

## 2018-06-14 MED ORDER — MIDAZOLAM HCL 5 MG/5ML IJ SOLN
INTRAMUSCULAR | Status: DC | PRN
  Administered 2018-06-14: 1 mg via INTRAVENOUS
  Administered 2018-06-14: 2 mg via INTRAVENOUS
  Administered 2018-06-14 (×2): 1 mg via INTRAVENOUS

## 2018-06-14 MED ORDER — SODIUM CHLORIDE 0.9 % IV SOLN
INTRAVENOUS | Status: DC
  Administered 2018-06-14: 08:00:00 via INTRAVENOUS

## 2018-06-14 MED ORDER — MEPERIDINE HCL 100 MG/ML IJ SOLN
INTRAMUSCULAR | Status: DC | PRN
  Administered 2018-06-14: 15 mg via INTRAVENOUS
  Administered 2018-06-14: 25 mg via INTRAVENOUS

## 2018-06-14 MED ORDER — MIDAZOLAM HCL 5 MG/5ML IJ SOLN
INTRAMUSCULAR | Status: AC
  Filled 2018-06-14: qty 5

## 2018-06-14 MED ORDER — STERILE WATER FOR IRRIGATION IR SOLN
Status: DC | PRN
  Administered 2018-06-14: 1.5 mL

## 2018-06-14 MED ORDER — ONDANSETRON HCL 4 MG/2ML IJ SOLN
INTRAMUSCULAR | Status: DC | PRN
  Administered 2018-06-14: 4 mg via INTRAVENOUS

## 2018-06-14 MED ORDER — MEPERIDINE HCL 50 MG/ML IJ SOLN
INTRAMUSCULAR | Status: AC
  Filled 2018-06-14: qty 1

## 2018-06-14 MED ORDER — ONDANSETRON HCL 4 MG/2ML IJ SOLN
INTRAMUSCULAR | Status: AC
  Filled 2018-06-14: qty 2

## 2018-06-14 NOTE — Op Note (Signed)
[facility] Patient Name: Christine Newman Procedure Date: 06/14/2018 8:10 AM MRN: [id number] Date of Birth: 1941-03-26 Attending MD: Norvel Richards , MD CSN: [id number] Age: 77 Admit Type: Outpatient Procedure:                Colonoscopy Indications:              High risk colon cancer surveillance: Crohn's                            disease small intestine Providers:                Norvel Richards, MD, Charlsie Quest. Theda Sers RN, RN,                            Aram Candela Referring MD:              Medicines:                Midazolam 5 mg IV, Meperidine 40 mg IV Complications:            No immediate complications. Estimated Blood Loss:     Estimated blood loss: none. Procedure:                Pre-Anesthesia Assessment:                           - Prior to the procedure, a History and Physical                            was performed, and patient medications and                            allergies were reviewed. The patient's tolerance of                            previous anesthesia was also reviewed. The risks                            and benefits of the procedure and the sedation                            options and risks were discussed with the patient.                            All questions were answered, and informed consent                            was obtained. Prior Anticoagulants: The patient has                            taken no previous anticoagulant or antiplatelet                            agents. ASA Grade Assessment: II - A patient with  mild systemic disease. After reviewing the risks                            and benefits, the patient was deemed in                            satisfactory condition to undergo the procedure.                           After obtaining informed consent, the colonoscope                            was passed under direct vision. Throughout the                            procedure, the  patient's blood pressure, pulse, and                            oxygen saturations were monitored continuously. The                            CF-[id number] ([id number]) scope was introduced through                            the anus and advanced to the 10 cm into the ileum.                            The colonoscopy was performed without difficulty.                            The patient tolerated the procedure well. The                            quality of the bowel preparation was adequate. Scope In: 8:48:18 AM Scope Out: 9:03:47 AM Scope Withdrawal Time: 0 hours 7 minutes 41 seconds  Total Procedure Duration: 0 hours 15 minutes 29 seconds  Findings:      The perianal and digital rectal examinations were normal.      Scattered medium-mouthed diverticula were found in the sigmoid colon and       descending colon. status post ileocecectomy To me. Anastomosis appeared       normal. Distal 10 cm of neoterminal ileum appeared entirely normal.      Non-bleeding internal hemorrhoids were found during retroflexion.      The exam was otherwise without abnormality on direct and retroflexion       views. Impression:               - Diverticulosis in the sigmoid colon and in the                            descending colon. Internal hemorrhoids.                           - Status post ileo-cecectomy; normal anastomosis  and neoterminal ileum. Patient appears to have a                            surgical remission.                           - The examination was otherwise normal on direct                            and retroflexion views.                           - No specimens collected. Moderate Sedation:      Moderate (conscious) sedation was administered by the endoscopy nurse       and supervised by the endoscopist. The following parameters were       monitored: oxygen saturation, heart rate, blood pressure, respiratory       rate, EKG, adequacy of pulmonary  ventilation, and response to care.       Total physician intraservice time was 23 minutes. Recommendation:           - Patient has a contact number available for                            emergencies. The signs and symptoms of potential                            delayed complications were discussed with the                            patient. Return to normal activities tomorrow.                            Written discharge instructions were provided to the                            patient.                           - Resume previous diet at this time; I do not feel                            ongoing remittive therapy for Crohn's disease is                            strongly indicated. Recent surgery may be all she                            needs for the duration.                           - I do not recommend a future colonoscopy unless                            new symptoms develop.                           -  Return to GI office in 1 year. Procedure Code(s):        --- Professional ---                           [id number], Colonoscopy, flexible; diagnostic, including                            collection of specimen(s) by brushing or washing,                            when performed (separate procedure)                           G0500, Moderate sedation services provided by the                            same physician or other qualified health care                            professional performing a gastrointestinal                            endoscopic service that sedation supports,                            requiring the presence of an independent trained                            observer to assist in the monitoring of the                            patient's level of consciousness and physiological                            status; initial 15 minutes of intra-service time;                            patient age 25 years or older (additional time may                             be reported with [phone], as appropriate)                           [phone], Moderate sedation services provided by the                            same physician or other qualified health care                            professional performing the diagnostic or                            therapeutic service that the sedation supports,  requiring the presence of an independent trained                            observer to assist in the monitoring of the                            patient's level of consciousness and physiological                            status; each additional 15 minutes intraservice                            time (List separately in addition to code for                            primary service) Diagnosis Code(s):        --- Professional ---                           K50.00, Crohn's disease of small intestine without                            complications                           K64.8, Other hemorrhoids                           K57.30, Diverticulosis of large intestine without                            perforation or abscess without bleeding CPT copyright 2017 [employer]. All rights reserved. The codes documented in this report are preliminary and upon coder review may  be revised to meet current compliance requirements. Cristopher Estimable. Torrion Witter, MD Norvel Richards, MD 06/14/2018 9:29:13 AM This report has been signed electronically. Number of Addenda: 0

## 2018-06-14 NOTE — Discharge Instructions (Signed)
Colonoscopy Discharge Instructions  Read the instructions outlined below and refer to this sheet in the next few weeks. These discharge instructions provide you with general information on caring for yourself after you leave the hospital. Your doctor may also give you specific instructions. While your treatment has been planned according to the most current medical practices available, unavoidable complications occasionally occur. If you have any problems or questions after discharge, call Dr. Gala Romney at [phone]. ACTIVITY  You may resume your regular activity, but move at a slower pace for the next 24 hours.   Take frequent rest periods for the next 24 hours.   Walking will help get rid of the air and reduce the bloated feeling in your belly (abdomen).   No driving for 24 hours (because of the medicine (anesthesia) used during the test).    Do not sign any important legal documents or operate any machinery for 24 hours (because of the anesthesia used during the test).  NUTRITION  Drink plenty of fluids.   You may resume your normal diet as instructed by your doctor.   Begin with a light meal and progress to your normal diet. Heavy or fried foods are harder to digest and may make you feel sick to your stomach (nauseated).   Avoid alcoholic beverages for 24 hours or as instructed.  MEDICATIONS  You may resume your normal medications unless your doctor tells you otherwise.  WHAT YOU CAN EXPECT TODAY  Some feelings of bloating in the abdomen.   Passage of more gas than usual.   Spotting of blood in your stool or on the toilet paper.  IF YOU HAD POLYPS REMOVED DURING THE COLONOSCOPY:  No aspirin products for 7 days or as instructed.   No alcohol for 7 days or as instructed.   Eat a soft diet for the next 24 hours.  FINDING OUT THE RESULTS OF YOUR TEST Not all test results are available during your visit. If your test results are not back during the visit, make an appointment  with your caregiver to find out the results. Do not assume everything is normal if you have not heard from your caregiver or the medical facility. It is important for you to follow up on all of your test results.  SEEK IMMEDIATE MEDICAL ATTENTION IF:  You have more than a spotting of blood in your stool.   Your belly is swollen (abdominal distention).   You are nauseated or vomiting.   You have a temperature over 101.   You have abdominal pain or discomfort that is severe or gets worse throughout the day.    Diverticulosis information provided  Crohn's disease appears to be in remission based on today's examination which is great news.  Fiber fortified diet recommended  I do not recommend a future colonoscopy unless new symptoms develop  Office visit with [place] in one year    Diverticulosis Diverticulosis is a condition that develops when small pouches (diverticula) form in the wall of the large intestine (colon). The colon is where water is absorbed and stool is formed. The pouches form when the inside layer of the colon pushes through weak spots in the outer layers of the colon. You may have a few pouches or many of them. What are the causes? The cause of this condition is not known. What increases the risk? The following factors may make you more likely to develop this condition:  Being older than age 6. Your risk for this condition increases with  age. Diverticulosis is rare among people younger than age 32. By age 96, many people have it.  Eating a low-fiber diet.  Having frequent constipation.  Being overweight.  Not getting enough exercise.  Smoking.  Taking over-the-counter pain medicines, like aspirin and ibuprofen.  Having a family history of diverticulosis.  What are the signs or symptoms? In most people, there are no symptoms of this condition. If you do have symptoms, they may include:  Bloating.  Cramps in the abdomen.  Constipation or  diarrhea.  Pain in the lower left side of the abdomen.  How is this diagnosed? This condition is most often diagnosed during an exam for other colon problems. Because diverticulosis usually has no symptoms, it often cannot be diagnosed independently. This condition may be diagnosed by:  Using a flexible scope to examine the colon (colonoscopy).  Taking an X-ray of the colon after dye has been put into the colon (barium enema).  Doing a CT scan.  How is this treated? You may not need treatment for this condition if you have never developed an infection related to diverticulosis. If you have had an infection before, treatment may include:  Eating a high-fiber diet. This may include eating more fruits, vegetables, and grains.  Taking a fiber supplement.  Taking a live bacteria supplement (probiotic).  Taking medicine to relax your colon.  Taking antibiotic medicines.  Follow these instructions at home:  Drink 6-8 glasses of water or more each day to prevent constipation.  Try not to strain when you have a bowel movement.  If you have had an infection before: ? Eat more fiber as directed by your health care provider or your diet and nutrition specialist (dietitian). ? Take a fiber supplement or probiotic, if your health care provider approves.  Take over-the-counter and prescription medicines only as told by your health care provider.  If you were prescribed an antibiotic, take it as told by your health care provider. Do not stop taking the antibiotic even if you start to feel better.  Keep all follow-up visits as told by your health care provider. This is important. Contact a health care provider if:  You have pain in your abdomen.  You have bloating.  You have cramps.  You have not had a bowel movement in 3 days. Get help right away if:  Your pain gets worse.  Your bloating becomes very bad.  You have a fever or chills, and your symptoms suddenly get  worse.  You vomit.  You have bowel movements that are bloody or black.  You have bleeding from your rectum. Summary  Diverticulosis is a condition that develops when small pouches (diverticula) form in the wall of the large intestine (colon).  You may have a few pouches or many of them.  This condition is most often diagnosed during an exam for other colon problems.  If you have had an infection related to diverticulosis, treatment may include increasing the fiber in your diet, taking supplements, or taking medicines. This information is not intended to replace advice given to you by your health care provider. Make sure you discuss any questions you have with your health care provider. Document Released: 06/24/2004 Document Revised: 08/16/2016 Document Reviewed: 08/16/2016 Elsevier Interactive Patient Education  2017 [place].    High-Fiber Diet Fiber, also called dietary fiber, is a type of carbohydrate found in fruits, vegetables, whole grains, and beans. A high-fiber diet can have many health benefits. Your health care provider may recommend a  high-fiber diet to help:  Prevent constipation. Fiber can make your bowel movements more regular.  Lower your cholesterol.  Relieve hemorrhoids, uncomplicated diverticulosis, or irritable bowel syndrome.  Prevent overeating as part of a weight-loss plan.  Prevent heart disease, type 2 diabetes, and certain cancers.  What is my plan? The recommended daily intake of fiber includes:  38 grams for men under age 31.  46 grams for men over age 18.  31 grams for women under age 58.  50 grams for women over age 41.  You can get the recommended daily intake of dietary fiber by eating a variety of fruits, vegetables, grains, and beans. Your health care provider may also recommend a fiber supplement if it is not possible to get enough fiber through your diet. What do I need to know about a high-fiber diet?  Fiber supplements have  not been widely studied for their effectiveness, so it is better to get fiber through food sources.  Always check the fiber content on thenutrition facts label of any prepackaged food. Look for foods that contain at least 5 grams of fiber per serving.  Ask your dietitian if you have questions about specific foods that are related to your condition, especially if those foods are not listed in the following section.  Increase your daily fiber consumption gradually. Increasing your intake of dietary fiber too quickly may cause bloating, cramping, or gas.  Drink plenty of water. Water helps you to digest fiber. What foods can I eat? Grains Whole-grain breads. Multigrain cereal. Oats and oatmeal. Brown rice. Barley. Bulgur wheat. [place]. Bran muffins. Popcorn. Rye wafer crackers. Vegetables Sweet potatoes. Spinach. Kale. Artichokes. Cabbage. Broccoli. Green peas. Carrots. Squash. Fruits Berries. Pears. Apples. Oranges. Avocados. Prunes and raisins. Dried figs. Meats and Other Protein Sources [place], kidney, pinto, and soy beans. Split peas. Lentils. Nuts and seeds. Dairy Fiber-fortified yogurt. Beverages Fiber-fortified soy milk. Fiber-fortified orange juice. Other Fiber bars. The items listed above may not be a complete list of recommended foods or beverages. Contact your dietitian for more options. What foods are not recommended? Grains White bread. Pasta made with refined flour. White rice. Vegetables Fried potatoes. Canned vegetables. Well-cooked vegetables. Fruits Fruit juice. Cooked, strained fruit. Meats and Other Protein Sources Fatty cuts of meat. Fried Sales executive or fried fish. Dairy Milk. Yogurt. Cream cheese. Sour cream. Beverages Soft drinks. Other Cakes and pastries. Butter and oils. The items listed above may not be a complete list of foods and beverages to avoid. Contact your dietitian for more information. What are some tips for including high-fiber foods in my  diet?  Eat a wide variety of high-fiber foods.  Make sure that half of all grains consumed each day are whole grains.  Replace breads and cereals made from refined flour or white flour with whole-grain breads and cereals.  Replace white rice with brown rice, bulgur wheat, or millet.  Start the day with a breakfast that is high in fiber, such as a cereal that contains at least 5 grams of fiber per serving.  Use beans in place of meat in soups, salads, or pasta.  Eat high-fiber snacks, such as berries, raw vegetables, nuts, or popcorn. This information is not intended to replace advice given to you by your health care provider. Make sure you discuss any questions you have with your health care provider. Document Released: 09/27/2005 Document Revised: 03/04/2016 Document Reviewed: 03/12/2014 Elsevier Interactive Patient Education  [employer].

## 2018-06-14 NOTE — Interval H&P Note (Signed)
History and Physical Interval Note:  06/14/2018 8:38 AM  Christine Newman  has presented today for surgery, with the diagnosis of crohn's disease  The various methods of treatment have been discussed with the patient and family. After consideration of risks, benefits and other options for treatment, the patient has consented to  Procedure(s) with comments: COLONOSCOPY (N/A) - 8:30am as a surgical intervention .  The patient's history has been reviewed, patient examined, no change in status, stable for surgery.  I have reviewed the patient's chart and labs.  Questions were answered to the patient's satisfaction.     Pt wo abdominal pain or any other GI sx at this time; on no meds for inflammatory bowel dx.  TCS today per plan.  The risks, benefits, limitations, alternatives and imponderables have been reviewed with the patient. Questions have been answered. All parties are agreeable.   Manus Rudd

## 2018-06-19 ENCOUNTER — Ambulatory Visit (INDEPENDENT_AMBULATORY_CARE_PROVIDER_SITE_OTHER): Payer: Medicare Other | Admitting: Otolaryngology

## 2018-06-19 DIAGNOSIS — H903 Sensorineural hearing loss, bilateral: Secondary | ICD-10-CM | POA: Diagnosis not present

## 2018-06-20 ENCOUNTER — Encounter (HOSPITAL_COMMUNITY): Payer: Self-pay | Admitting: Internal Medicine

## 2018-06-27 ENCOUNTER — Other Ambulatory Visit: Payer: Self-pay | Admitting: Urology

## 2018-06-27 DIAGNOSIS — N200 Calculus of kidney: Secondary | ICD-10-CM

## 2018-07-03 ENCOUNTER — Ambulatory Visit (HOSPITAL_COMMUNITY)
Admission: RE | Admit: 2018-07-03 | Discharge: 2018-07-03 | Disposition: A | Discharge status: Home / Self Care | Payer: Medicare Other | Source: Ambulatory Visit | Attending: Urology | Admitting: Urology

## 2018-07-03 DIAGNOSIS — N200 Calculus of kidney: Secondary | ICD-10-CM

## 2018-07-03 DIAGNOSIS — N261 Atrophy of kidney (terminal): Secondary | ICD-10-CM | POA: Insufficient documentation

## 2018-07-12 ENCOUNTER — Ambulatory Visit: Payer: Medicare Other | Admitting: Urology

## 2018-07-12 DIAGNOSIS — N200 Calculus of kidney: Secondary | ICD-10-CM

## 2018-07-18 ENCOUNTER — Ambulatory Visit: Payer: Medicare Other | Admitting: Family Medicine

## 2018-07-31 NOTE — Patient Instructions (Signed)
Your procedure is scheduled on: 08/10/2018  Report to [facility] at  66   AM.  Call this number if you have problems the morning of surgery: [phone]   Do not eat food or drink liquids :After Midnight.      Take these medicines the morning of surgery with A SIP OF WATER: None   Do not wear jewelry, make-up or nail polish.  Do not wear lotions, powders, or perfumes. You may wear deodorant.  Do not shave 48 hours prior to surgery.  Do not bring valuables to the hospital.  Contacts, dentures or bridgework may not be worn into surgery.  Leave suitcase in the car. After surgery it may be brought to your room.  For patients admitted to the hospital, checkout time is 11:00 AM the day of discharge.   Patients discharged the day of surgery will not be allowed to drive home.  :     Please read over the following fact sheets that you were given: Coughing and Deep Breathing, Surgical Site Infection Prevention, Anesthesia Post-op Instructions and Care and Recovery After Surgery    Cataract A cataract is a clouding of the lens of the eye. When a lens becomes cloudy, vision is reduced based on the degree and nature of the clouding. Many cataracts reduce vision to some degree. Some cataracts make people more near-sighted as they develop. Other cataracts increase glare. Cataracts that are ignored and become worse can sometimes look white. The white color can be seen through the pupil. CAUSES   Aging. However, cataracts may occur at any age, even in newborns.   Certain drugs.   Trauma to the eye.   Certain diseases such as diabetes.   Specific eye diseases such as chronic inflammation inside the eye or a sudden attack of a rare form of glaucoma.   Inherited or acquired medical problems.  SYMPTOMS   Gradual, progressive drop in vision in the affected eye.   Severe, rapid visual loss. This most often happens when trauma is the cause.  DIAGNOSIS  To detect a cataract, an eye doctor examines  the lens. Cataracts are best diagnosed with an exam of the eyes with the pupils enlarged (dilated) by drops.  TREATMENT  For an early cataract, vision may improve by using different eyeglasses or stronger lighting. If that does not help your vision, surgery is the only effective treatment. A cataract needs to be surgically removed when vision loss interferes with your everyday activities, such as driving, reading, or watching TV. A cataract may also have to be removed if it prevents examination or treatment of another eye problem. Surgery removes the cloudy lens and usually replaces it with a substitute lens (intraocular lens, IOL).  At a time when both you and your doctor agree, the cataract will be surgically removed. If you have cataracts in both eyes, only one is usually removed at a time. This allows the operated eye to heal and be out of danger from any possible problems after surgery (such as infection or poor wound healing). In rare cases, a cataract may be doing damage to your eye. In these cases, your caregiver may advise surgical removal right away. The vast majority of people who have cataract surgery have better vision afterward. HOME CARE INSTRUCTIONS  If you are not planning surgery, you may be asked to do the following:  Use different eyeglasses.   Use stronger or brighter lighting.   Ask your eye doctor about reducing your medicine dose  or changing medicines if it is thought that a medicine caused your cataract. Changing medicines does not make the cataract go away on its own.   Become familiar with your surroundings. Poor vision can lead to injury. Avoid bumping into things on the affected side. You are at a higher risk for tripping or falling.   Exercise extreme care when driving or operating machinery.   Wear sunglasses if you are sensitive to bright light or experiencing problems with glare.  SEEK IMMEDIATE MEDICAL CARE IF:   You have a worsening or sudden vision loss.    You notice redness, swelling, or increasing pain in the eye.   You have a fever.  Document Released: 09/27/2005 Document Revised: 09/16/2011 Document Reviewed: 05/21/2011 [facility] Patient Information 2012 [place].PATIENT INSTRUCTIONS POST-ANESTHESIA  IMMEDIATELY FOLLOWING SURGERY:  Do not drive or operate machinery for the first twenty four hours after surgery.  Do not make any important decisions for twenty four hours after surgery or while taking narcotic pain medications or sedatives.  If you develop intractable nausea and vomiting or a severe headache please notify your doctor immediately.  FOLLOW-UP:  Please make an appointment with your surgeon as instructed. You do not need to follow up with anesthesia unless specifically instructed to do so.  WOUND CARE INSTRUCTIONS (if applicable):  Keep a dry clean dressing on the anesthesia/puncture wound site if there is drainage.  Once the wound has quit draining you may leave it open to air.  Generally you should leave the bandage intact for twenty four hours unless there is drainage.  If the epidural site drains for more than 36-48 hours please call the anesthesia department.  QUESTIONS?:  Please feel free to call your physician or the hospital operator if you have any questions, and they will be happy to assist you.

## 2018-08-04 ENCOUNTER — Encounter (HOSPITAL_COMMUNITY)
Admission: RE | Admit: 2018-08-04 | Discharge: 2018-08-04 | Disposition: A | Discharge status: Home / Self Care | Payer: Medicare Other | Source: Ambulatory Visit | Attending: Ophthalmology | Admitting: Ophthalmology

## 2018-08-04 ENCOUNTER — Encounter (HOSPITAL_COMMUNITY): Payer: Self-pay

## 2018-08-04 ENCOUNTER — Other Ambulatory Visit: Payer: Self-pay

## 2018-08-04 DIAGNOSIS — Z01812 Encounter for preprocedural laboratory examination: Secondary | ICD-10-CM | POA: Insufficient documentation

## 2018-08-04 HISTORY — DX: Anemia, unspecified: D64.9

## 2018-08-10 ENCOUNTER — Ambulatory Visit (HOSPITAL_COMMUNITY): Payer: Medicare Other | Admitting: Anesthesiology

## 2018-08-10 ENCOUNTER — Ambulatory Visit (HOSPITAL_COMMUNITY)
Admission: RE | Admit: 2018-08-10 | Discharge: 2018-08-10 | Disposition: A | Discharge status: Home / Self Care | Payer: Medicare Other | Source: Ambulatory Visit | Attending: Ophthalmology | Admitting: Ophthalmology

## 2018-08-10 ENCOUNTER — Encounter (HOSPITAL_COMMUNITY): Payer: Self-pay | Admitting: *Deleted

## 2018-08-10 ENCOUNTER — Encounter (HOSPITAL_COMMUNITY)
Admission: RE | Disposition: A | Discharge status: Home / Self Care | Payer: Self-pay | Source: Ambulatory Visit | Attending: Ophthalmology

## 2018-08-10 DIAGNOSIS — Z79899 Other long term (current) drug therapy: Secondary | ICD-10-CM | POA: Insufficient documentation

## 2018-08-10 DIAGNOSIS — I10 Essential (primary) hypertension: Secondary | ICD-10-CM | POA: Insufficient documentation

## 2018-08-10 DIAGNOSIS — H2512 Age-related nuclear cataract, left eye: Secondary | ICD-10-CM | POA: Diagnosis not present

## 2018-08-10 DIAGNOSIS — Z7982 Long term (current) use of aspirin: Secondary | ICD-10-CM | POA: Insufficient documentation

## 2018-08-10 HISTORY — PX: CATARACT EXTRACTION W/PHACO: SHX586

## 2018-08-10 SURGERY — PHACOEMULSIFICATION, CATARACT, WITH IOL INSERTION
Anesthesia: Monitor Anesthesia Care | Site: Eye | Laterality: Left

## 2018-08-10 MED ORDER — TETRACAINE HCL 0.5 % OP SOLN
1.0000 [drp] | OPHTHALMIC | Status: AC
  Administered 2018-08-10 (×3): 1 [drp] via OPHTHALMIC

## 2018-08-10 MED ORDER — PROMETHAZINE HCL 25 MG/ML IJ SOLN: 6.2500 mg | INTRAMUSCULAR | Status: DC | PRN

## 2018-08-10 MED ORDER — MIDAZOLAM HCL 2 MG/2ML IJ SOLN: 0.5000 mg | Freq: Once | INTRAMUSCULAR | Status: DC | PRN

## 2018-08-10 MED ORDER — BSS IO SOLN
INTRAOCULAR | Status: DC | PRN
  Administered 2018-08-10: 15 mL

## 2018-08-10 MED ORDER — CYCLOPENTOLATE-PHENYLEPHRINE 0.2-1 % OP SOLN
1.0000 [drp] | OPHTHALMIC | Status: AC
  Administered 2018-08-10 (×3): 1 [drp] via OPHTHALMIC

## 2018-08-10 MED ORDER — NEOMYCIN-POLYMYXIN-DEXAMETH 3.5-10000-0.1 OP SUSP
OPHTHALMIC | Status: DC | PRN
  Administered 2018-08-10: 1 [drp] via OPHTHALMIC

## 2018-08-10 MED ORDER — MIDAZOLAM HCL 2 MG/2ML IJ SOLN
INTRAMUSCULAR | Status: DC | PRN
  Administered 2018-08-10 (×2): 1 mg via INTRAVENOUS

## 2018-08-10 MED ORDER — HYDROMORPHONE HCL 1 MG/ML IJ SOLN: 0.2500 mg | INTRAMUSCULAR | Status: DC | PRN

## 2018-08-10 MED ORDER — POVIDONE-IODINE 5 % OP SOLN
OPHTHALMIC | Status: DC | PRN
  Administered 2018-08-10: 1 via OPHTHALMIC

## 2018-08-10 MED ORDER — PHENYLEPHRINE HCL 2.5 % OP SOLN
1.0000 [drp] | OPHTHALMIC | Status: AC
  Administered 2018-08-10 (×3): 1 [drp] via OPHTHALMIC

## 2018-08-10 MED ORDER — LIDOCAINE HCL (PF) 1 % IJ SOLN
INTRAMUSCULAR | Status: DC | PRN
  Administered 2018-08-10: .4 mL

## 2018-08-10 MED ORDER — LACTATED RINGERS IV SOLN
INTRAVENOUS | Status: DC
  Administered 2018-08-10: 1000 mL via INTRAVENOUS

## 2018-08-10 MED ORDER — HYDROCODONE-ACETAMINOPHEN 7.5-325 MG PO TABS: 1.0000 | ORAL_TABLET | Freq: Once | ORAL | Status: DC | PRN

## 2018-08-10 MED ORDER — EPINEPHRINE PF 1 MG/ML IJ SOLN
INTRAOCULAR | Status: DC | PRN
  Administered 2018-08-10: 500 mL

## 2018-08-10 MED ORDER — PROVISC 10 MG/ML IO SOLN
INTRAOCULAR | Status: DC | PRN
  Administered 2018-08-10: 0.85 mL via INTRAOCULAR

## 2018-08-10 MED ORDER — LIDOCAINE HCL 3.5 % OP GEL
1.0000 "application " | Freq: Once | OPHTHALMIC | Status: AC
  Administered 2018-08-10: 1 via OPHTHALMIC

## 2018-08-10 MED ORDER — MIDAZOLAM HCL 2 MG/2ML IJ SOLN
INTRAMUSCULAR | Status: AC
  Filled 2018-08-10: qty 2

## 2018-08-10 SURGICAL SUPPLY — 10 items
CLOTH BEACON ORANGE TIMEOUT ST (SAFETY) ×3 IMPLANT
EYE SHIELD UNIVERSAL CLEAR (GAUZE/BANDAGES/DRESSINGS) ×3 IMPLANT
GLOVE BIOGEL PI IND STRL 7.0 (GLOVE) ×2 IMPLANT
GLOVE BIOGEL PI INDICATOR 7.0 (GLOVE) ×4
PAD ARMBOARD 7.5X6 YLW CONV (MISCELLANEOUS) ×3 IMPLANT
SIGHTPATH CAT PROC W REG LENS (Ophthalmic Related) ×3 IMPLANT
SYRINGE LUER LOK 1CC (MISCELLANEOUS) ×3 IMPLANT
TAPE SURG TRANSPORE 1 IN (GAUZE/BANDAGES/DRESSINGS) ×1 IMPLANT
TAPE SURGICAL TRANSPORE 1 IN (GAUZE/BANDAGES/DRESSINGS) ×2
WATER STERILE IRR 250ML POUR (IV SOLUTION) ×3 IMPLANT

## 2018-08-10 NOTE — Op Note (Signed)
Date of Admission: 08/10/2018  Date of Surgery: 08/10/2018  Pre-Op Dx: Cataract Left  Eye  Post-Op Dx: Senile Nuclear Cataract  Left  Eye,  Dx Code H25.12  Surgeon: Tonny Branch, M.D.  Assistants: None  Anesthesia: Topical with MAC  Indications: Painless, progressive loss of vision with compromise of daily activities.  Surgery: Cataract Extraction with Intraocular lens Implant Left Eye  Discription: The patient had dilating drops and viscous lidocaine placed into the Left eye in the pre-op holding area. After transfer to the operating room, a time out was performed. The patient was then prepped and draped. Beginning with a 74m blade a paracentesis port was made at the surgeon's 2 o'clock position. The anterior chamber was then filled with 1% non-preserved lidocaine. This was followed by filling the anterior chamber with Provisc.  A 2.430mkeratome blade was used to make a clear corneal incision at the temporal limbus.  A bent cystatome needle was used to create a continuous tear capsulotomy. Hydrodissection was performed with balanced salt solution on a Fine canula. The lens nucleus was then removed using the phacoemulsification handpiece. Residual cortex was removed with the I&A handpiece. The anterior chamber and capsular bag were refilled with Provisc. A posterior chamber intraocular lens was placed into the capsular bag with it's injector. The implant was positioned with the Kuglan hook. The Provisc was then removed from the anterior chamber and capsular bag with the I&A handpiece. Stromal hydration of the main incision and paracentesis port was performed with BSS on a Fine canula. The wounds were tested for leak which was negative. The patient tolerated the procedure well. There were no operative complications. The patient was then transferred to the recovery room in stable condition.  Complications: None  Specimen: None  EBL: None  Prosthetic device: J&J Technis, PCB00, power 23.5, SN  [id number]

## 2018-08-10 NOTE — H&P (Signed)
I have reviewed the H&P, the patient was re-examined, and I have identified no interval changes in medical condition and plan of care since the history and physical of record  

## 2018-08-10 NOTE — Anesthesia Postprocedure Evaluation (Signed)
Anesthesia Post Note  Patient: Christine Newman  Procedure(s) Performed: CATARACT EXTRACTION PHACO AND INTRAOCULAR LENS PLACEMENT LEFT EYE (Left Eye)  Patient location during evaluation: Short Stay Anesthesia Type: MAC Level of consciousness: awake and alert and patient cooperative Pain management: satisfactory to patient Vital Signs Assessment: post-procedure vital signs reviewed and stable Respiratory status: spontaneous breathing Cardiovascular status: stable Postop Assessment: no apparent nausea or vomiting Anesthetic complications: no     Last Vitals:  Vitals:   08/10/18 0854 08/10/18 0946  BP:  140/79  Pulse: (!) 56 (!) 57  Resp: 20 18  Temp: 36.8 C 36.7 C  SpO2: 98% 100%    Last Pain:  Vitals:   08/10/18 0946  TempSrc: Oral  PainSc: 0-No pain                 Johnda Billiot

## 2018-08-10 NOTE — H&P (View-Only) (Signed)
I have reviewed the H&P, the patient was re-examined, and I have identified no interval changes in medical condition and plan of care since the history and physical of record  

## 2018-08-10 NOTE — Transfer of Care (Signed)
Immediate Anesthesia Transfer of Care Note  Patient: Christine Newman  Procedure(s) Performed: CATARACT EXTRACTION PHACO AND INTRAOCULAR LENS PLACEMENT LEFT EYE (Left Eye)  Patient Location: Short Stay  Anesthesia Type:MAC  Level of Consciousness: awake, alert  and patient cooperative  Airway & Oxygen Therapy: Patient Spontanous Breathing  Post-op Assessment: Report given to RN and Post -op Vital signs reviewed and stable  Post vital signs: Reviewed and stable  Last Vitals:  Vitals Value Taken Time  BP    Temp    Pulse    Resp    SpO2      Last Pain:  Vitals:   08/10/18 0854  TempSrc: Oral      Patients Stated Pain Goal: 6 (01/75/10 2585)  Complications: No apparent anesthesia complications

## 2018-08-10 NOTE — Anesthesia Preprocedure Evaluation (Signed)
Anesthesia Evaluation  Patient identified by MRN, date of birth, ID band Patient awake    Reviewed: Allergy & Precautions, NPO status , Patient's Chart, lab work & pertinent test results  Airway Mallampati: II  TM Distance: >3 FB Neck ROM: Full    Dental no notable dental hx. (+) Teeth Intact   Pulmonary neg pulmonary ROS, former smoker,    Pulmonary exam normal breath sounds clear to auscultation       Cardiovascular Exercise Tolerance: Good hypertension, Pt. on medications negative cardio ROS Normal cardiovascular examI Rhythm:Regular Rate:Normal     Neuro/Psych Anxiety negative neurological ROS  negative psych ROS   GI/Hepatic negative GI ROS, Neg liver ROS,   Endo/Other  negative endocrine ROS  Renal/GU Renal InsufficiencyRenal disease  negative genitourinary   Musculoskeletal negative musculoskeletal ROS (+)   Abdominal   Peds negative pediatric ROS (+)  Hematology negative hematology ROS (+) anemia ,   Anesthesia Other Findings   Reproductive/Obstetrics negative OB ROS                            Anesthesia Physical Anesthesia Plan  ASA: II  Anesthesia Plan: MAC   Post-op Pain Management:    Induction: Intravenous  PONV Risk Score and Plan:   Airway Management Planned: Simple Face Mask and Nasal Cannula  Additional Equipment:   Intra-op Plan:   Post-operative Plan:   Informed Consent: I have reviewed the patients History and Physical, chart, labs and discussed the procedure including the risks, benefits and alternatives for the proposed anesthesia with the patient or authorized representative who has indicated his/her understanding and acceptance.   Dental advisory given  Plan Discussed with: CRNA  Anesthesia Plan Comments: (anxious)        Anesthesia Quick Evaluation

## 2018-08-10 NOTE — Discharge Instructions (Signed)
Monitored Anesthesia Care, Care After  These instructions provide you with information about caring for yourself after your procedure. Your health care provider may also give you more specific instructions. Your treatment has been planned according to current medical practices, but problems sometimes occur. Call your health care provider if you have any problems or questions after your procedure.  What can I expect after the procedure?  After your procedure, it is common to:   Feel sleepy for several hours.   Feel clumsy and have poor balance for several hours.   Feel forgetful about what happened after the procedure.   Have poor judgment for several hours.   Feel nauseous or vomit.   Have a sore throat if you had a breathing tube during the procedure.    Follow these instructions at home:  For at least 24 hours after the procedure:     Do not:  ? Participate in activities in which you could fall or become injured.  ? Drive.  ? Use heavy machinery.  ? Drink alcohol.  ? Take sleeping pills or medicines that cause drowsiness.  ? Make important decisions or sign legal documents.  ? Take care of children on your own.   Rest.  Eating and drinking   Follow the diet that is recommended by your health care provider.   If you vomit, drink water, juice, or soup when you can drink without vomiting.   Make sure you have little or no nausea before eating solid foods.  General instructions   Have a responsible adult stay with you until you are awake and alert.   Take over-the-counter and prescription medicines only as told by your health care provider.   If you smoke, do not smoke without supervision.   Keep all follow-up visits as told by your health care provider. This is important.  Contact a health care provider if:   You keep feeling nauseous or you keep vomiting.   You feel light-headed.   You develop a rash.   You have a fever.  Get help right away if:   You have trouble breathing.  This information is  not intended to replace advice given to you by your health care provider. Make sure you discuss any questions you have with your health care provider.  Document Released: 01/18/2016 Document Revised: 05/19/2016 Document Reviewed: 01/18/2016  Elsevier Interactive Patient Education  2018 [employer]

## 2018-08-10 NOTE — Anesthesia Procedure Notes (Signed)
Procedure Name: MAC Date/Time: 08/10/2018 9:27 AM Performed by: Vista Deck, CRNA Pre-anesthesia Checklist: Patient identified, Emergency Drugs available, Suction available, Timeout performed and Patient being monitored Patient Re-evaluated:Patient Re-evaluated prior to induction Oxygen Delivery Method: Nasal Cannula

## 2018-08-11 ENCOUNTER — Encounter (HOSPITAL_COMMUNITY): Payer: Self-pay | Admitting: Ophthalmology

## 2018-08-24 ENCOUNTER — Encounter: Payer: Self-pay | Admitting: Family Medicine

## 2018-08-24 ENCOUNTER — Ambulatory Visit: Payer: Medicare Other | Admitting: Family Medicine

## 2018-08-24 VITALS — BP 138/80 | Ht 66.0 in | Wt 152.6 lb

## 2018-08-24 DIAGNOSIS — I10 Essential (primary) hypertension: Secondary | ICD-10-CM

## 2018-08-24 DIAGNOSIS — N289 Disorder of kidney and ureter, unspecified: Secondary | ICD-10-CM | POA: Diagnosis not present

## 2018-08-24 DIAGNOSIS — Z23 Encounter for immunization: Secondary | ICD-10-CM | POA: Diagnosis not present

## 2018-08-24 DIAGNOSIS — N183 Chronic kidney disease, stage 3 unspecified: Secondary | ICD-10-CM

## 2018-08-24 MED ORDER — VERAPAMIL HCL ER 240 MG PO TBCR: 240.0000 mg | EXTENDED_RELEASE_TABLET | Freq: Every day | ORAL | 1 refills | Status: DC

## 2018-08-24 NOTE — Progress Notes (Signed)
   Subjective:    Patient ID: Christine Newman, female    DOB: September 09, 1941, 77 y.o.   MRN: [id number]  HPI Pt here today for 6 month follow up on hypokalemia. Pt also here for follow up on HTN. Pt states her BP is staying high. This morning it was 171/91; took bp again on right arm and it was 138/70.   Blood pressure medicine and blood pressure levels reviewed today with patient. Compliant with blood pressure medicine. States does not miss a dose. No obvious side effects. Blood pressure generally good when checked elsewhere. Watching salt intake.  BP s   On the hi side when cking at home  Dr Dellis Filbert had prev wellness three yrs ago     Review of Systems No headache, no major weight loss or weight gain, no chest pain no back pain abdominal pain no change in bowel habits complete ROS otherwise negative     Objective:   Physical Exam Alert and oriented, vitals reviewed and stable, NAD ENT-TM's and ext canals WNL bilat via otoscopic exam Soft palate, tonsils and post pharynx WNL via oropharyngeal exam Neck-symmetric, no masses; thyroid nonpalpable and nontender Pulmonary-no tachypnea or accessory muscle use; Clear without wheezes via auscultation Card--no abnrml murmurs, rhythm reg and rate WNL Carotid pulses symmetric, without bruits        Assessment & Plan:  Impression hypertension.  Decent control on repeat.  Some numbers elevated at home overall good.  Compliance discussed  2.  Chronic kidney disease.  Stage III.  Also followed by specialist followed by specialist.  Symptoms discussed  3.  Vaccine concerns.  Discussed at length.  Please see checkout sheet he spent a lot of time going over this with the patient.  Greater than 50% of this 25 minute face to face visit was spent in counseling and discussion and coordination of care regarding the above diagnosis/diagnosies

## 2018-08-24 NOTE — Patient Instructions (Signed)
There are now two different pneumpnia shots given to folks 36 and oder which are a good idea   I generally discuss and administer these at the preventive visit   Many of my women patients have decided over the yrs to go to a ob gyn for their preventive ck up and I understand that 100%   Many f my oler folks go to the local pharmacy these days for pneumonia shots and I understand 100%  Christine Newman, I have no idea pneumponia shot you have had, and I hope you understand that

## 2018-08-29 ENCOUNTER — Encounter (HOSPITAL_COMMUNITY)
Admission: RE | Admit: 2018-08-29 | Discharge: 2018-08-29 | Disposition: A | Discharge status: Home / Self Care | Payer: Medicare Other | Source: Ambulatory Visit | Attending: Ophthalmology | Admitting: Ophthalmology

## 2018-09-04 ENCOUNTER — Ambulatory Visit (HOSPITAL_COMMUNITY): Payer: Medicare Other | Admitting: Anesthesiology

## 2018-09-04 ENCOUNTER — Encounter (HOSPITAL_COMMUNITY)
Admission: RE | Disposition: A | Discharge status: Home / Self Care | Payer: Self-pay | Source: Ambulatory Visit | Attending: Ophthalmology

## 2018-09-04 ENCOUNTER — Ambulatory Visit (HOSPITAL_COMMUNITY)
Admission: RE | Admit: 2018-09-04 | Discharge: 2018-09-04 | Disposition: A | Discharge status: Home / Self Care | Payer: Medicare Other | Source: Ambulatory Visit | Attending: Ophthalmology | Admitting: Ophthalmology

## 2018-09-04 ENCOUNTER — Encounter (HOSPITAL_COMMUNITY): Payer: Self-pay | Admitting: Anesthesiology

## 2018-09-04 DIAGNOSIS — D649 Anemia, unspecified: Secondary | ICD-10-CM | POA: Insufficient documentation

## 2018-09-04 DIAGNOSIS — H2511 Age-related nuclear cataract, right eye: Secondary | ICD-10-CM | POA: Diagnosis not present

## 2018-09-04 DIAGNOSIS — Z87891 Personal history of nicotine dependence: Secondary | ICD-10-CM | POA: Insufficient documentation

## 2018-09-04 DIAGNOSIS — Z79899 Other long term (current) drug therapy: Secondary | ICD-10-CM | POA: Diagnosis not present

## 2018-09-04 DIAGNOSIS — F419 Anxiety disorder, unspecified: Secondary | ICD-10-CM | POA: Insufficient documentation

## 2018-09-04 DIAGNOSIS — I10 Essential (primary) hypertension: Secondary | ICD-10-CM | POA: Insufficient documentation

## 2018-09-04 HISTORY — PX: CATARACT EXTRACTION W/PHACO: SHX586

## 2018-09-04 SURGERY — PHACOEMULSIFICATION, CATARACT, WITH IOL INSERTION
Anesthesia: Monitor Anesthesia Care | Site: Eye | Laterality: Right

## 2018-09-04 MED ORDER — POVIDONE-IODINE 5 % OP SOLN
OPHTHALMIC | Status: DC | PRN
  Administered 2018-09-04: 1 via OPHTHALMIC

## 2018-09-04 MED ORDER — TETRACAINE HCL 0.5 % OP SOLN
1.0000 [drp] | OPHTHALMIC | Status: AC
  Administered 2018-09-04 (×3): 1 [drp] via OPHTHALMIC

## 2018-09-04 MED ORDER — LACTATED RINGERS IV SOLN
INTRAVENOUS | Status: DC
  Administered 2018-09-04: 08:00:00 via INTRAVENOUS

## 2018-09-04 MED ORDER — MIDAZOLAM HCL 5 MG/5ML IJ SOLN
INTRAMUSCULAR | Status: DC | PRN
  Administered 2018-09-04: 2 mg via INTRAVENOUS

## 2018-09-04 MED ORDER — EPINEPHRINE PF 1 MG/ML IJ SOLN
INTRAOCULAR | Status: DC | PRN
  Administered 2018-09-04: 500 mL

## 2018-09-04 MED ORDER — BSS IO SOLN
INTRAOCULAR | Status: DC | PRN
  Administered 2018-09-04: 15 mL

## 2018-09-04 MED ORDER — LIDOCAINE 3.5 % OP GEL OPTIME - NO CHARGE
OPHTHALMIC | Status: DC | PRN
  Administered 2018-09-04: 1 [drp] via OPHTHALMIC

## 2018-09-04 MED ORDER — NEOMYCIN-POLYMYXIN-DEXAMETH 3.5-10000-0.1 OP SUSP
OPHTHALMIC | Status: DC | PRN
  Administered 2018-09-04: 2 [drp] via OPHTHALMIC

## 2018-09-04 MED ORDER — LIDOCAINE HCL (PF) 1 % IJ SOLN
INTRAMUSCULAR | Status: DC | PRN
  Administered 2018-09-04: .5 mL

## 2018-09-04 MED ORDER — CYCLOPENTOLATE-PHENYLEPHRINE 0.2-1 % OP SOLN
1.0000 [drp] | OPHTHALMIC | Status: AC
  Administered 2018-09-04 (×3): 1 [drp] via OPHTHALMIC

## 2018-09-04 MED ORDER — MIDAZOLAM HCL 2 MG/2ML IJ SOLN
INTRAMUSCULAR | Status: AC
  Filled 2018-09-04: qty 2

## 2018-09-04 MED ORDER — LIDOCAINE HCL 3.5 % OP GEL
1.0000 "application " | Freq: Once | OPHTHALMIC | Status: AC
  Administered 2018-09-04: 1 via OPHTHALMIC

## 2018-09-04 MED ORDER — PHENYLEPHRINE HCL 2.5 % OP SOLN
1.0000 [drp] | OPHTHALMIC | Status: AC
  Administered 2018-09-04 (×3): 1 [drp] via OPHTHALMIC

## 2018-09-04 MED ORDER — PROVISC 10 MG/ML IO SOLN
INTRAOCULAR | Status: DC | PRN
  Administered 2018-09-04: 0.85 mL via INTRAOCULAR

## 2018-09-04 SURGICAL SUPPLY — 10 items
CLOTH BEACON ORANGE TIMEOUT ST (SAFETY) ×2 IMPLANT
EYE SHIELD UNIVERSAL CLEAR (GAUZE/BANDAGES/DRESSINGS) ×2 IMPLANT
GLOVE BIOGEL PI IND STRL 6.5 (GLOVE) ×2 IMPLANT
GLOVE BIOGEL PI INDICATOR 6.5 (GLOVE) ×2
LENS ALC ACRYL/TECN (Ophthalmic Related) ×2 IMPLANT
PAD ARMBOARD 7.5X6 YLW CONV (MISCELLANEOUS) ×2 IMPLANT
SYRINGE LUER LOK 1CC (MISCELLANEOUS) ×2 IMPLANT
TAPE SURG TRANSPORE 1 IN (GAUZE/BANDAGES/DRESSINGS) ×1 IMPLANT
TAPE SURGICAL TRANSPORE 1 IN (GAUZE/BANDAGES/DRESSINGS) ×1
WATER STERILE IRR 250ML POUR (IV SOLUTION) ×2 IMPLANT

## 2018-09-04 NOTE — Interval H&P Note (Signed)
History and Physical Interval Note:  09/04/2018 8:41 AM  Christine Newman  has presented today for surgery, with the diagnosis of nuclear cataract right eye  The various methods of treatment have been discussed with the patient and family. After consideration of risks, benefits and other options for treatment, the patient has consented to  Procedure(s) with comments: CATARACT EXTRACTION PHACO AND INTRAOCULAR LENS PLACEMENT (IOC) (Right) - right as a surgical intervention .  The patient's history has been reviewed, patient examined, no change in status, stable for surgery.  I have reviewed the patient's chart and labs.  Questions were answered to the patient's satisfaction.     Kessa Fairbairn

## 2018-09-04 NOTE — Discharge Instructions (Signed)

## 2018-09-04 NOTE — Transfer of Care (Addendum)
Immediate Anesthesia Transfer of Care Note  Patient: Christine Newman  Procedure(s) Performed: CATARACT EXTRACTION PHACO AND INTRAOCULAR LENS PLACEMENT (IOC) (Right Eye)  Patient Location: PACU  Anesthesia Type:MAC  Level of Consciousness: awake and alert   Airway & Oxygen Therapy: Patient Spontanous Breathing  Post-op Assessment: Report given to RN and Post -op Vital signs reviewed and stable  Post vital signs: Reviewed and stable  Last Vitals:  Vitals Value Taken Time  BP    Temp    Pulse    Resp    SpO2      Last Pain:  Vitals:   09/04/18 0758  PainSc: 0-No pain         Complications: No apparent anesthesia complications

## 2018-09-04 NOTE — Anesthesia Postprocedure Evaluation (Signed)
Anesthesia Post Note  Patient: Christine Newman  Procedure(s) Performed: CATARACT EXTRACTION PHACO AND INTRAOCULAR LENS PLACEMENT (IOC) (Right Eye)  Patient location during evaluation: PACU Anesthesia Type: MAC Level of consciousness: awake and alert Pain management: satisfactory to patient Vital Signs Assessment: post-procedure vital signs reviewed and stable Respiratory status: spontaneous breathing and respiratory function stable Cardiovascular status: stable and blood pressure returned to baseline Postop Assessment: no headache Anesthetic complications: no     Last Vitals:  Vitals:   09/04/18 0758 09/04/18 0904  BP: (!) 145/75 130/75  Pulse: 60 68  Resp: 18 18  Temp: 36.9 C 36.7 C  SpO2: 99% 95%    Last Pain:  Vitals:   09/04/18 0904  TempSrc: Oral  PainSc: 0-No pain                 Whitman Meinhardt C [place]

## 2018-09-04 NOTE — Op Note (Signed)
Date of Admission: 09/04/2018  Date of Surgery: 09/04/2018  Pre-Op Dx: Cataract Right  Eye  Post-Op Dx: Senile Nuclear Cataract  Right  Eye,  Dx Code H25.11  Surgeon: Tonny Branch, M.D.  Assistants: None  Anesthesia: Topical with MAC  Indications: Painless, progressive loss of vision with compromise of daily activities.  Surgery: Cataract Extraction with Intraocular lens Implant Right Eye  Discription: The patient had dilating drops and viscous lidocaine placed into the Right eye in the pre-op holding area. After transfer to the operating room, a time out was performed. The patient was then prepped and draped. Beginning with a 76m blade a paracentesis port was made at the surgeon's 2 o'clock position. The anterior chamber was then filled with 1% non-preserved lidocaine. This was followed by filling the anterior chamber with Provisc.  A 2.431mkeratome blade was used to make a clear corneal incision at the temporal limbus.  A bent cystatome needle was used to create a continuous tear capsulotomy. Hydrodissection was performed with balanced salt solution on a Fine canula. The lens nucleus was then removed using the phacoemulsification handpiece. Residual cortex was removed with the I&A handpiece. The anterior chamber and capsular bag were refilled with Provisc. A posterior chamber intraocular lens was placed into the capsular bag with it's injector. The implant was positioned with the Kuglan hook. The Provisc was then removed from the anterior chamber and capsular bag with the I&A handpiece. Stromal hydration of the main incision and paracentesis port was performed with BSS on a Fine canula. The wounds were tested for leak which was negative. The patient tolerated the procedure well. There were no operative complications. The patient was then transferred to the recovery room in stable condition.  Complications: None  Specimen: None  EBL: None  Prosthetic device: J&J Technis, PCB00, power 20.5,  SN [id number]

## 2018-09-04 NOTE — Anesthesia Preprocedure Evaluation (Signed)
Anesthesia Evaluation  Patient identified by MRN, date of birth, ID band Patient awake    Reviewed: Allergy & Precautions, NPO status , Patient's Chart, lab work & pertinent test results  Airway Mallampati: II  TM Distance: >3 FB Neck ROM: Full    Dental no notable dental hx. (+) Teeth Intact   Pulmonary neg pulmonary ROS, former smoker,    Pulmonary exam normal breath sounds clear to auscultation       Cardiovascular Exercise Tolerance: Good hypertension, Pt. on medications negative cardio ROS Normal cardiovascular examI Rhythm:Regular Rate:Normal     Neuro/Psych Anxiety negative neurological ROS  negative psych ROS   GI/Hepatic negative GI ROS, Neg liver ROS,   Endo/Other  negative endocrine ROS  Renal/GU Renal InsufficiencyRenal disease  negative genitourinary   Musculoskeletal negative musculoskeletal ROS (+)   Abdominal   Peds negative pediatric ROS (+)  Hematology negative hematology ROS (+) Blood dyscrasia, anemia ,   Anesthesia Other Findings   Reproductive/Obstetrics negative OB ROS                             Anesthesia Physical  Anesthesia Plan  ASA: II  Anesthesia Plan: MAC   Post-op Pain Management:    Induction: Intravenous  PONV Risk Score and Plan:   Airway Management Planned: Simple Face Mask and Nasal Cannula  Additional Equipment:   Intra-op Plan:   Post-operative Plan:   Informed Consent: I have reviewed the patients History and Physical, chart, labs and discussed the procedure including the risks, benefits and alternatives for the proposed anesthesia with the patient or authorized representative who has indicated his/her understanding and acceptance.   Dental advisory given  Plan Discussed with: CRNA  Anesthesia Plan Comments: (anxious)        Anesthesia Quick Evaluation

## 2018-09-04 NOTE — H&P (Signed)
I have reviewed the H&P, the patient was re-examined, and I have identified no interval changes in medical condition and plan of care since the history and physical of record  

## 2018-09-05 ENCOUNTER — Encounter (HOSPITAL_COMMUNITY): Payer: Self-pay | Admitting: Ophthalmology

## 2018-09-05 NOTE — Addendum Note (Signed)
Addendum  created 09/05/18 0935 by Vista Deck, CRNA   Charge Capture section accepted

## 2018-09-15 ENCOUNTER — Encounter: Payer: Self-pay | Admitting: Gastroenterology

## 2018-09-15 ENCOUNTER — Ambulatory Visit: Payer: Medicare Other | Admitting: Gastroenterology

## 2018-09-15 DIAGNOSIS — K5090 Crohn's disease, unspecified, without complications: Secondary | ICD-10-CM

## 2018-09-15 NOTE — Patient Instructions (Signed)
I am so glad you are doing well!  We will see you in 1 year.  Please call with any problems in the meantime.  Have a wonderful Christmas!  Annitta Needs, PhD, ANP-BC [facility]

## 2018-09-15 NOTE — Assessment & Plan Note (Signed)
Enteritis noted at time of diagnostic laparoscopy with ileocecectomy earlier this year by Dr. Marcello Moores. Colonoscopy now on file completely normal. In surgical remission. No need for therapy. Return in 1 year or sooner if needed.

## 2018-09-15 NOTE — Progress Notes (Signed)
cc'ed to pcp °

## 2018-09-15 NOTE — Progress Notes (Signed)
Referring Provider: Mikey Kirschner, MD Primary Care Physician:  Mikey Kirschner, MD Primary GI: Dr. Gala Romney   Chief Complaint  Patient presents with  . Crohn's Disease    HPI:   Christine Newman is a 77 y.o. female presenting today with a history of complicated diverticulitis with abscess in the past, and presenting beginning of this year with inflamed small bowel on CT and partial obstructive symptoms, s/p diagnostic laparoscopy with ileocecectomy by Dr. Leighton Ruff. 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 She has remained asymptomatic without therapy. Colonoscopy recently completed with scattered diverticula in sigmoid and descending colon. Distal 10 cm of neoterminal ileum appeared normal. Appears to have surgical remission.   She has no abdominal pain. No rectal bleeding. Denies any upper GI concerns. Long-standing history of more frequent bowel movements at baseline. No GI concerns today. Still followed for kidney stones by urology.   Past Medical History:  Diagnosis Date  . Anal polyp 2005   condyloma acuminatum  . Anemia   . CKD (chronic kidney disease)    Cr 1.9 -09/2014  . Colonic mass   . Diverticulitis    with abscess  . History of kidney stones   . Hypertension     Past Surgical History:  Procedure Laterality Date  . anal polypectomy  01/02/2004   Dr.Rosenbower- anal polyp removed. bx= condyloma acuminatum  . CATARACT EXTRACTION W/PHACO Left 08/10/2018   Procedure: CATARACT EXTRACTION PHACO AND INTRAOCULAR LENS PLACEMENT LEFT EYE;  Surgeon: Tonny Branch, MD;  Location: [facility];  Service: Ophthalmology;  Laterality: Left;  left  . CATARACT EXTRACTION W/PHACO Right 09/04/2018   Procedure: CATARACT EXTRACTION PHACO AND INTRAOCULAR LENS PLACEMENT (IOC);  Surgeon: Tonny Branch, MD;  Location: [facility];  Service: Ophthalmology;  Laterality: Right;  CDE: 11.16  . COLONOSCOPY  04/08/2003   Dr.Mann- small nonbleeding internal and external  hemorrhoids small polypoid mass at anal verge, normal appearing L colon, transverse colon, R colon including cecum. stenotic ileocecal valve. bx of maa= benign rectal type mucosa with features of mucosal prolapse syndrome  . COLONOSCOPY N/A 04/02/2015   Dr.Rourk- colonic diverticulosis, no evidence of colonic neoplasm. abnormal ileocecal valve with ulceration bx= ulcerated/eroded benign ileocecal valve mucosa  . COLONOSCOPY N/A 06/14/2018   scattered diverticula in sigmoid and descending colon. Distal 10 cm of neoterminal ileum appears normal. Appears to have surgical remission  . FLEXIBLE SIGMOIDOSCOPY  11/06/2003   Dr.Mann- small nodular mass at anal verge, bx done. early L side diverticula. bx= benign rectal mucosa with features of mucosal prolapse syndrome. no adenomatous changes or evidence of malignancy identified.   . IR NEPHROSTOMY PLACEMENT LEFT  01/05/2018  . KIDNEY STONE SURGERY    . LAPAROSCOPY N/A 11/02/2017   Procedure: LAPAROSCOPIC ILEOCECECTOMY;  Surgeon: Leighton Ruff, MD;  Location: [facility];  Service: General;  Laterality: N/A;  . NEPHROLITHOTOMY Left 02/13/2018   Procedure: NEPHROLITHOTOMY PERCUTANEOUS;  Surgeon: Cleon Gustin, MD;  Location: [facility];  Service: Urology;  Laterality: Left;    Current Outpatient Medications  Medication Sig Dispense Refill  . cholecalciferol (VITAMIN D) 1000 units tablet Take 1,000 Units by mouth daily.    . folic acid (FOLVITE) 676 MCG tablet Take 400 mcg by mouth daily.    . Multiple Vitamins-Minerals (MULTIVITAMIN WITH MINERALS) tablet Take 1 tablet by mouth daily.    . Potassium Citrate (UROCIT-K 15) 15 MEQ (1620 MG) TBCR Take 15 tablets by mouth 2 (two) times daily.     Marland Kitchen  PRESCRIPTION MEDICATION Place 1 drop into the left eye 3 (three) times daily. Imprimis Eye Drop    . verapamil (CALAN-SR) 240 MG CR tablet Take 1 tablet (240 mg total) by mouth at bedtime. 90 tablet 1  . vitamin B-12 (CYANOCOBALAMIN) 1000 MCG tablet Take 1,000 mcg by mouth  daily.     No current facility-administered medications for this visit.     Allergies as of 09/15/2018  . (No Known Allergies)    Family History  Problem Relation Age of Onset  . Hypertension Mother   . Heart disease Father   . Cancer Sister        breast  . Colon cancer Neg Hx   . Colon polyps Neg Hx     Social History   Socioeconomic History  . Marital status: Widowed    Spouse name: Not on file  . Number of children: Not on file  . Years of education: Not on file  . Highest education level: Not on file  Occupational History  . Not on file  Social Needs  . Financial resource strain: Not on file  . Food insecurity:    Worry: Not on file    Inability: Not on file  . Transportation needs:    Medical: Not on file    Non-medical: Not on file  Tobacco Use  . Smoking status: Former Smoker    Packs/day: 0.25    Years: 5.00    Pack years: 1.25    Types: Cigarettes    Last attempt to quit: 01/18/1962    Years since quitting: 56.6  . Smokeless tobacco: Never Used  . Tobacco comment: 1 pack cigarettes per week ; quit in her early 33s   Substance and Sexual Activity  . Alcohol use: No  . Drug use: Never  . Sexual activity: Not Currently    Birth control/protection: Post-menopausal  Lifestyle  . Physical activity:    Days per week: Not on file    Minutes per session: Not on file  . Stress: Not on file  Relationships  . Social connections:    Talks on phone: Not on file    Gets together: Not on file    Attends religious service: Not on file    Active member of club or organization: Not on file    Attends meetings of clubs or organizations: Not on file    Relationship status: Not on file  Other Topics Concern  . Not on file  Social History Narrative  . Not on file    Review of Systems: Gen: Denies fever, chills, anorexia. Denies fatigue, weakness, weight loss.  CV: Denies chest pain, palpitations, syncope, peripheral edema, and claudication. Resp: Denies  dyspnea at rest, cough, wheezing, coughing up blood, and pleurisy. GI: see HPI  Derm: Denies rash, itching, dry skin Psych: Denies depression, anxiety, memory loss, confusion. No homicidal or suicidal ideation.  Heme: Denies bruising, bleeding, and enlarged lymph nodes.  Physical Exam: BP (!) 144/88   Pulse 75   Temp (!) 97.3 F (36.3 C) (Oral)   Ht 5' 6"  (1.676 m)   Wt 151 lb 9.6 oz (68.8 kg)   LMP  (LMP Unknown)   BMI 24.47 kg/m  General:   Alert and oriented. No distress noted. Pleasant and cooperative.  Head:  Normocephalic and atraumatic. Eyes:  Conjuctiva clear without scleral icterus. Mouth:  Oral mucosa pink and moist.  Abdomen:  +BS, soft, non-tender and non-distended. No rebound or guarding. No HSM or masses noted.  Msk:  Symmetrical without gross deformities. Normal posture. Extremities:  Without edema. Neurologic:  Alert and  oriented x4 Psych:  Alert and cooperative. Normal mood and affect.

## 2018-10-23 ENCOUNTER — Ambulatory Visit (HOSPITAL_COMMUNITY)
Admission: RE | Admit: 2018-10-23 | Discharge: 2018-10-23 | Disposition: A | Discharge status: Home / Self Care | Payer: Medicare Other | Source: Ambulatory Visit | Attending: Urology | Admitting: Urology

## 2018-10-23 ENCOUNTER — Other Ambulatory Visit (HOSPITAL_COMMUNITY): Payer: Self-pay | Admitting: Urology

## 2018-10-23 DIAGNOSIS — N200 Calculus of kidney: Secondary | ICD-10-CM | POA: Diagnosis present

## 2018-10-25 ENCOUNTER — Ambulatory Visit: Payer: Medicare Other | Admitting: Urology

## 2018-10-25 DIAGNOSIS — N200 Calculus of kidney: Secondary | ICD-10-CM

## 2019-02-20 ENCOUNTER — Ambulatory Visit (INDEPENDENT_AMBULATORY_CARE_PROVIDER_SITE_OTHER): Payer: Medicare Other | Admitting: Family Medicine

## 2019-02-20 ENCOUNTER — Encounter: Payer: Self-pay | Admitting: Family Medicine

## 2019-02-20 ENCOUNTER — Other Ambulatory Visit: Payer: Self-pay

## 2019-02-20 VITALS — BP 152/88 | Wt 150.0 lb

## 2019-02-20 DIAGNOSIS — I10 Essential (primary) hypertension: Secondary | ICD-10-CM

## 2019-02-20 DIAGNOSIS — N183 Chronic kidney disease, stage 3 unspecified: Secondary | ICD-10-CM

## 2019-02-20 DIAGNOSIS — F411 Generalized anxiety disorder: Secondary | ICD-10-CM | POA: Diagnosis not present

## 2019-02-20 MED ORDER — VERAPAMIL HCL ER 180 MG PO TBCR: EXTENDED_RELEASE_TABLET | ORAL | 1 refills | Status: DC

## 2019-02-20 NOTE — Progress Notes (Addendum)
   Subjective:    Patient ID: Christine Newman, female    DOB: 05-11-1941, 78 y.o.   MRN: [id number] Audio only Hypertension  This is a chronic problem. (Pt did get up dizzy one morning. Happened several weeks ago. Has not happened since) There are no compliance problems.   pt has been taking BP regularly.   Virtual Visit via Video Note  I connected with Christine Newman on 02/20/19 at  9:00 AM EDT by a video enabled telemedicine application and verified that I am speaking with the correct person using two identifiers.  Location: Patient: home Provider: office   I discussed the limitations of evaluation and management by telemedicine and the availability of in person appointments. The patient expressed understanding and agreed to proceed.  History of Present Illness:    Observations/Objective:   Assessment and Plan:   Follow Up Instructions:    I discussed the assessment and treatment plan with the patient. The patient was provided an opportunity to ask questions and all were answered. The patient agreed with the plan and demonstrated an understanding of the instructions.   The patient was advised to call back or seek an in-person evaluation if the symptoms worsen or if the condition fails to improve as anticipated.  I provided 26  minutes of non-face-to-face time during this encounter.  Patient has known history of chronic kidney disease.  Patient has known history of chronic anxiety.  Patient has a number of questions regarding coronavirus. Vicente Males, LPN  Blood pressure medicine and blood pressure levels reviewed today with patient. Compliant with blood pressure medicine. States does not miss a dose. No obvious side effects. Blood pressure generally not goodwhen checked elsewhere. Watching salt intake.  Blood pressures consistently elevated over the last 6 months.  Concerning to the patient  Review of Systems No headache, no major weight loss or weight gain,  no chest pain no back pain abdominal pain no change in bowel habits complete ROS otherwise negative     Objective:   Physical Exam  Virtual visit      Assessment & Plan:  Impression hypertension.  Suboptimal control.  Concerns of patient and myself included particularly with patient's chronic kidney disease.  Recommend increasing verapamil to 180 SR twice daily.  Rationale discussed  2.  Chronic anxiety.  Overall stable.  Patient understandably somewhat anxious about current situation.  3.  Chronic kidney disease.  Stage III.  4 coronavirus concerns.  Discussed.  .  Follow-up in 6 months

## 2019-02-20 NOTE — Addendum Note (Signed)
Addended by: Mikey Kirschner on: 02/20/2019 11:26 AM   Modules accepted: Level of Service

## 2019-03-29 ENCOUNTER — Ambulatory Visit (INDEPENDENT_AMBULATORY_CARE_PROVIDER_SITE_OTHER): Payer: Medicare Other | Admitting: Otolaryngology

## 2019-03-29 DIAGNOSIS — H903 Sensorineural hearing loss, bilateral: Secondary | ICD-10-CM | POA: Diagnosis not present

## 2019-03-29 DIAGNOSIS — H6121 Impacted cerumen, right ear: Secondary | ICD-10-CM | POA: Diagnosis not present

## 2019-04-04 ENCOUNTER — Encounter: Payer: Self-pay | Admitting: Family Medicine

## 2019-05-15 ENCOUNTER — Other Ambulatory Visit (HOSPITAL_COMMUNITY): Payer: Self-pay | Admitting: Urology

## 2019-05-15 DIAGNOSIS — N200 Calculus of kidney: Secondary | ICD-10-CM

## 2019-05-16 ENCOUNTER — Other Ambulatory Visit: Payer: Self-pay

## 2019-05-16 ENCOUNTER — Ambulatory Visit (HOSPITAL_COMMUNITY)
Admission: RE | Admit: 2019-05-16 | Discharge: 2019-05-16 | Disposition: A | Discharge status: Home / Self Care | Payer: Medicare Other | Source: Ambulatory Visit | Attending: Urology | Admitting: Urology

## 2019-05-16 DIAGNOSIS — N200 Calculus of kidney: Secondary | ICD-10-CM

## 2019-05-23 ENCOUNTER — Ambulatory Visit (INDEPENDENT_AMBULATORY_CARE_PROVIDER_SITE_OTHER): Payer: Medicare Other | Admitting: Urology

## 2019-05-23 ENCOUNTER — Other Ambulatory Visit: Payer: Self-pay

## 2019-05-23 DIAGNOSIS — N200 Calculus of kidney: Secondary | ICD-10-CM | POA: Diagnosis not present

## 2019-06-25 ENCOUNTER — Encounter: Payer: Self-pay | Admitting: Internal Medicine

## 2019-07-04 IMAGING — US IR NEPHROSTOMY PLACEMENT LEFT
1 series · 3 of 3 positions shown · non-contrast
Comparison: None.

INDICATION: 76-year-old with an obstructive for right kidney stone. Patient is
febrile and concern for sepsis.

EXAM:
PERCUTANEOUS LEFT NEPHROSTOMY TUBE PLACEMENT WITH ULTRASOUND AND
FLUOROSCOPIC GUIDANCE

[Series 1: ir nephrostomy placement left · 0.18mm/px · 3 of 3 slices shown]
[im 1/3]
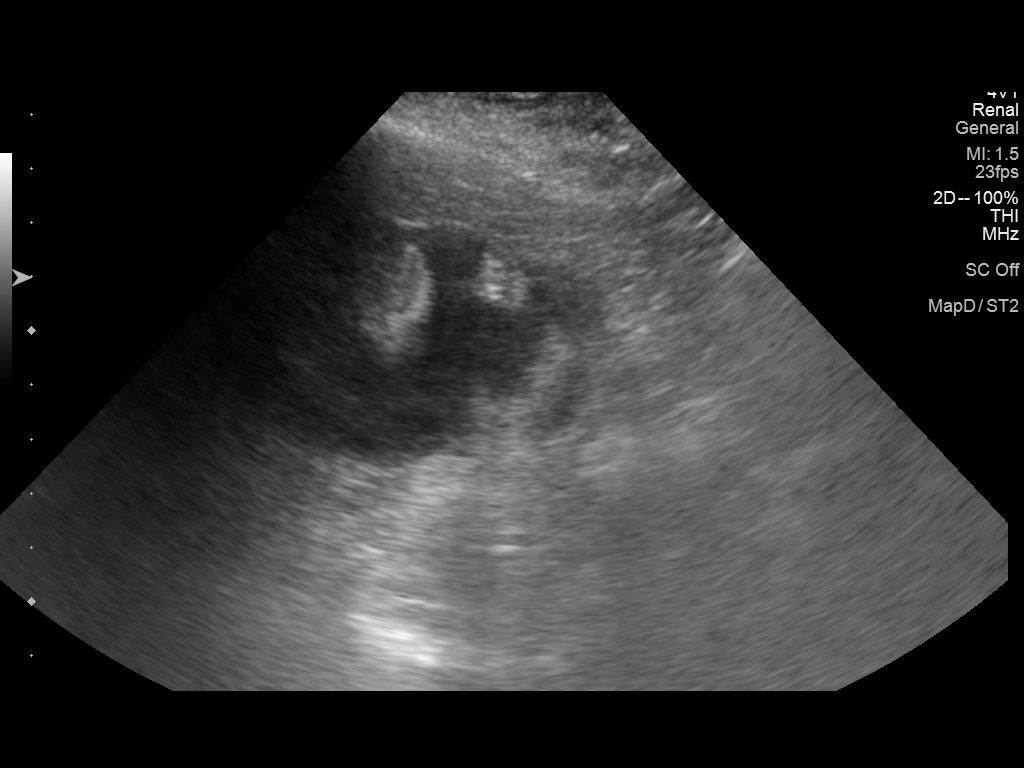
[im 2/3]
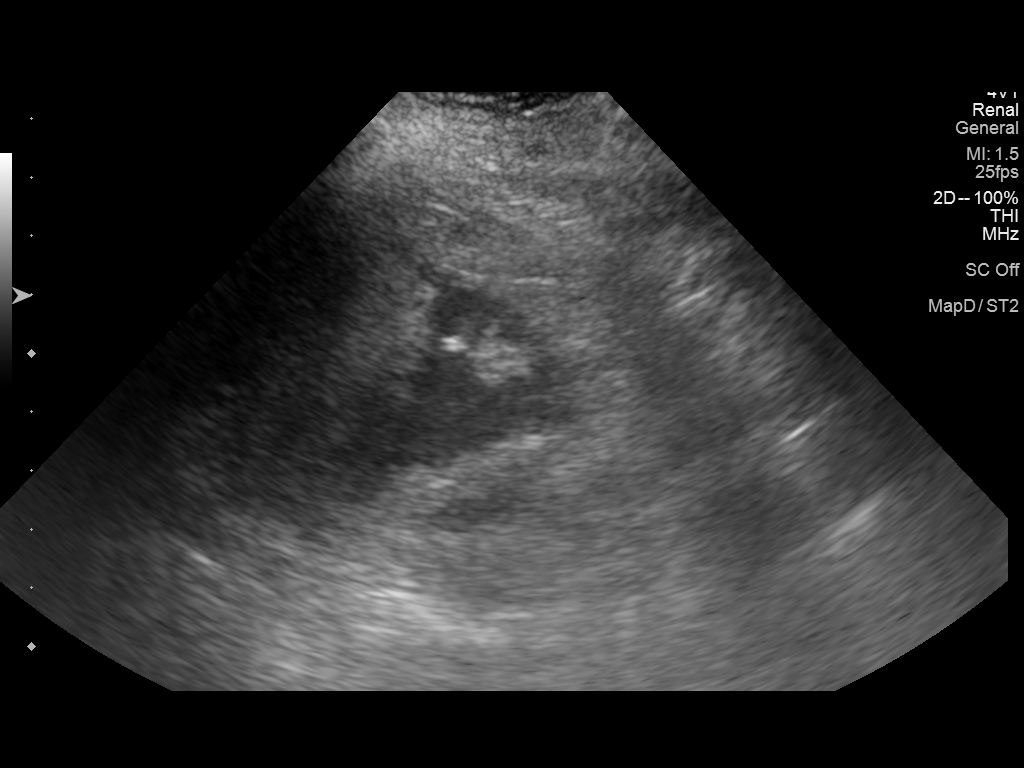
[im 3/3]
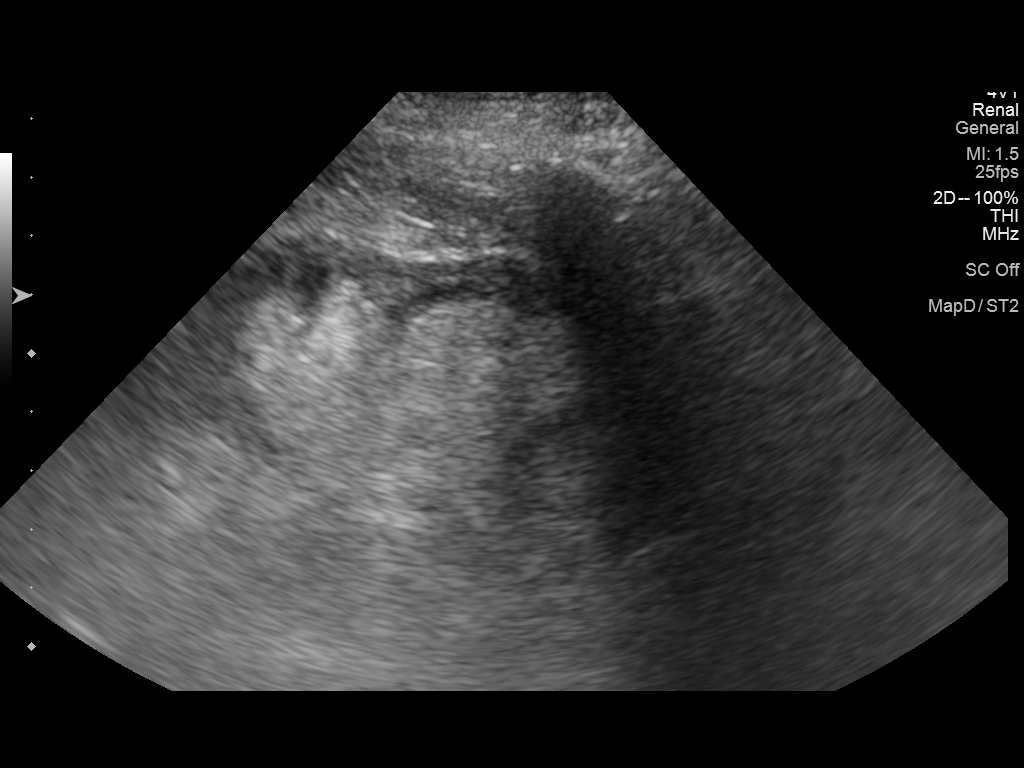

[3 of 3 positions shown; findings below may reference images not displayed]

MEDICATIONS:
IV Rocephin was given prior to the procedure.

ANESTHESIA/SEDATION:
Fentanyl 50 mcg IV; Versed 1.5 mg IV

Moderate Sedation Time:  21 minutes

The patient was continuously monitored during the procedure by the
interventional radiology nurse under my direct supervision.

CONTRAST:  5 mL-administered into the collecting system(s)

FLUOROSCOPY TIME:  Fluoroscopy Time: 30 seconds, 3 mGy

COMPLICATIONS:
None immediate.

PROCEDURE:
Informed written consent was obtained from the patient after a
thorough discussion of the procedural risks, benefits and
alternatives. All questions were addressed. Maximal Sterile Barrier
Technique was utilized including caps, mask, sterile gowns, sterile
gloves, sterile drape, hand hygiene and skin antiseptic. A timeout
was performed prior to the initiation of the procedure.

Ultrasound was used to identify a dilated left renal collecting
system. The left flank was prepped and draped in sterile fashion.
Skin was anesthetized with 1% lidocaine. 21 gauge needle was
directed into a dilated lower pole calyx with ultrasound guidance.
Wire was easily advanced into the collecting system. Accustick
dilator set was placed. A 10 French multipurpose drain was
subsequently placed over a wire. Approximately 20 mL of pink/yellow
purulent fluid was removed from the collecting system. Fluid was
sent for culture. Small amount of contrast and saline was injected
in the collecting system. Catheter was attached to a gravity bag.
Catheter was sutured to skin.

Fluoroscopic and ultrasound images were taken and saved for
documentation.
FINDINGS: Severe left hydronephrosis with underlying left renal atrophy.
Fluoroscopy demonstrated a obstructive 1.9 cm stone at the UPJ. 10
French catheter was successfully placed in the collecting system and
purulent fluid was removed. Renal collecting system was decompressed
at the end of the procedure.
IMPRESSION: Successful percutaneous left nephrostomy tube with ultrasound and
fluoroscopic guidance.

Purulent fluid was removed from the collecting system and sent for
culture.

## 2019-08-08 ENCOUNTER — Other Ambulatory Visit: Payer: Self-pay

## 2019-08-08 ENCOUNTER — Encounter: Payer: Self-pay | Admitting: Gastroenterology

## 2019-08-08 ENCOUNTER — Ambulatory Visit: Payer: Medicare Other | Admitting: Gastroenterology

## 2019-08-08 VITALS — BP 149/81 | HR 69 | Temp 97.0°F | Ht 66.0 in | Wt 153.4 lb

## 2019-08-08 DIAGNOSIS — K5090 Crohn's disease, unspecified, without complications: Secondary | ICD-10-CM | POA: Diagnosis not present

## 2019-08-08 NOTE — Progress Notes (Signed)
CC'ED TO PCP 

## 2019-08-08 NOTE — Progress Notes (Signed)
Referring Provider: Mikey Kirschner, MD Primary Care Physician:  Mikey Kirschner, MD Primary GI: Dr. Gala Romney    Chief Complaint  Patient presents with  . Crohn's Disease    f/u. Depending on what she eats at times with have diarrhea afterwards    HPI:   Christine Newman is a 78 y.o. female presenting today with a history of complicated diverticulitis with abscess in the past, and presented early 2019  with inflamed small bowel on CT and partial obstructive symptoms, s/p diagnostic laparoscopy with ileocecectomy by Dr. Leighton Ruff. 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 She has remained asymptomatic without therapy. Colonoscopy recently completed with scattered diverticula in sigmoid and descending colon. Distal 10 cm of neoterminal ileum appeared normal. Appears to have surgical remission.  Has postprandial frequent stools. Chronic. Sometimes watery, most of the time not. Doesn't want to try any prescriptive agents. No abdominal pain. Has always had more frequent stool life long. No rectal bleeding. Good appetite. No reflux. No dysphagia. Doing well.   Past Medical History:  Diagnosis Date  . Anal polyp 2005   condyloma acuminatum  . Anemia   . CKD (chronic kidney disease)    Cr 1.9 -09/2014  . Colonic mass   . Diverticulitis    with abscess  . History of kidney stones   . Hypertension     Past Surgical History:  Procedure Laterality Date  . anal polypectomy  01/02/2004   Dr.Rosenbower- anal polyp removed. bx= condyloma acuminatum  . CATARACT EXTRACTION W/PHACO Left 08/10/2018   Procedure: CATARACT EXTRACTION PHACO AND INTRAOCULAR LENS PLACEMENT LEFT EYE;  Surgeon: Tonny Branch, MD;  Location: [facility];  Service: Ophthalmology;  Laterality: Left;  left  . CATARACT EXTRACTION W/PHACO Right 09/04/2018   Procedure: CATARACT EXTRACTION PHACO AND INTRAOCULAR LENS PLACEMENT (IOC);  Surgeon: Tonny Branch, MD;  Location: [facility];  Service: Ophthalmology;   Laterality: Right;  CDE: 11.16  . COLONOSCOPY  04/08/2003   Dr.Mann- small nonbleeding internal and external hemorrhoids small polypoid mass at anal verge, normal appearing L colon, transverse colon, R colon including cecum. stenotic ileocecal valve. bx of maa= benign rectal type mucosa with features of mucosal prolapse syndrome  . COLONOSCOPY N/A 04/02/2015   Dr.Rourk- colonic diverticulosis, no evidence of colonic neoplasm. abnormal ileocecal valve with ulceration bx= ulcerated/eroded benign ileocecal valve mucosa  . COLONOSCOPY N/A 06/14/2018   scattered diverticula in sigmoid and descending colon. Distal 10 cm of neoterminal ileum appears normal. Appears to have surgical remission  . FLEXIBLE SIGMOIDOSCOPY  11/06/2003   Dr.Mann- small nodular mass at anal verge, bx done. early L side diverticula. bx= benign rectal mucosa with features of mucosal prolapse syndrome. no adenomatous changes or evidence of malignancy identified.   . IR NEPHROSTOMY PLACEMENT LEFT  01/05/2018  . KIDNEY STONE SURGERY    . LAPAROSCOPY N/A 11/02/2017   Procedure: LAPAROSCOPIC ILEOCECECTOMY;  Surgeon: Leighton Ruff, MD;  Location: [facility];  Service: General;  Laterality: N/A;  . NEPHROLITHOTOMY Left 02/13/2018   Procedure: NEPHROLITHOTOMY PERCUTANEOUS;  Surgeon: Cleon Gustin, MD;  Location: [facility];  Service: Urology;  Laterality: Left;    Current Outpatient Medications  Medication Sig Dispense Refill  . cholecalciferol (VITAMIN D) 1000 units tablet Take 1,000 Units by mouth daily.    . folic acid (FOLVITE) 701 MCG tablet Take 400 mcg by mouth daily.    . Multiple Vitamins-Minerals (MULTIVITAMIN WITH MINERALS) tablet Take 1 tablet by mouth daily.    Marland Kitchen  Potassium Citrate (UROCIT-K 15) 15 MEQ (1620 MG) TBCR Take 1 tablet by mouth 2 (two) times daily.     . verapamil (CALAN-SR) 180 MG CR tablet Take one tablet by mouth twice daily 180 tablet 1  . vitamin B-12 (CYANOCOBALAMIN) 1000 MCG tablet Take 1,000 mcg by mouth daily.      No current facility-administered medications for this visit.     Allergies as of 08/08/2019  . (No Known Allergies)    Family History  Problem Relation Age of Onset  . Hypertension Mother   . Heart disease Father   . Cancer Sister        breast  . Colon cancer Neg Hx   . Colon polyps Neg Hx     Social History   Socioeconomic History  . Marital status: Widowed    Spouse name: Not on file  . Number of children: Not on file  . Years of education: Not on file  . Highest education level: Not on file  Occupational History  . Not on file  Social Needs  . Financial resource strain: Not on file  . Food insecurity    Worry: Not on file    Inability: Not on file  . Transportation needs    Medical: Not on file    Non-medical: Not on file  Tobacco Use  . Smoking status: Former Smoker    Packs/day: 0.25    Years: 5.00    Pack years: 1.25    Types: Cigarettes    Quit date: 01/18/1962    Years since quitting: 57.5  . Smokeless tobacco: Never Used  . Tobacco comment: 1 pack cigarettes per week ; quit in her early 46s   Substance and Sexual Activity  . Alcohol use: No  . Drug use: Never  . Sexual activity: Not Currently    Birth control/protection: Post-menopausal  Lifestyle  . Physical activity    Days per week: Not on file    Minutes per session: Not on file  . Stress: Not on file  Relationships  . Social Herbalist on phone: Not on file    Gets together: Not on file    Attends religious service: Not on file    Active member of club or organization: Not on file    Attends meetings of clubs or organizations: Not on file    Relationship status: Not on file  Other Topics Concern  . Not on file  Social History Narrative  . Not on file    Review of Systems: Gen: Denies fever, chills, anorexia. Denies fatigue, weakness, weight loss.  CV: Denies chest pain, palpitations, syncope, peripheral edema, and claudication. Resp: Denies dyspnea at rest, cough,  wheezing, coughing up blood, and pleurisy. GI: see HPI Derm: Denies rash, itching, dry skin Psych: Denies depression, anxiety, memory loss, confusion. No homicidal or suicidal ideation.  Heme: Denies bruising, bleeding, and enlarged lymph nodes.  Physical Exam: BP (!) 149/81   Pulse 69   Temp (!) 97 F (36.1 C) (Oral)   Ht 5' 6"  (1.676 m)   Wt 153 lb 6.4 oz (69.6 kg)   LMP  (LMP Unknown)   BMI 24.76 kg/m  General:   Alert and oriented. No distress noted. Pleasant and cooperative.  Head:  Normocephalic and atraumatic. Eyes:  Conjuctiva clear without scleral icterus. Abdomen:  +BS, soft, non-tender and non-distended. No rebound or guarding. No HSM or masses noted. Msk:  Symmetrical without gross deformities. Normal posture. Extremities:  Without  edema. Neurologic:  Alert and  oriented x4 Psych:  Alert and cooperative. Normal mood and affect.  ASSESSMENT: Delightful 78 year old female with history of enteritis noted at time of diagnostic laparoscopy with ileocecectomy last year, colonoscopy on file normal, in surgical remission. Long-standing history of more frequent stool, unchanged. Discussed supportive measures. May take Imodium prn. Monitor for constipation.    PLAN:  Return in 1 year or sooner if needed Call with any concerns in interim.   Annitta Needs, PhD, ANP-BC [facility]

## 2019-08-08 NOTE — Patient Instructions (Signed)
I am glad you are doing well!  We will see you back in 1 year or sooner if needed!  I enjoyed seeing you again today! As you know, I value our relationship and want to provide genuine, compassionate, and quality care. I welcome your feedback. If you receive a survey regarding your visit,  I greatly appreciate you taking time to fill this out. See you next time!  Annitta Needs, PhD, ANP-BC [facility]

## 2019-08-20 ENCOUNTER — Encounter: Payer: Self-pay | Admitting: Family Medicine

## 2019-08-20 ENCOUNTER — Ambulatory Visit: Payer: Medicare Other | Admitting: Family Medicine

## 2019-08-20 ENCOUNTER — Other Ambulatory Visit: Payer: Self-pay

## 2019-08-20 VITALS — BP 128/82 | Temp 97.5°F | Ht 66.0 in | Wt 153.6 lb

## 2019-08-20 DIAGNOSIS — I10 Essential (primary) hypertension: Secondary | ICD-10-CM

## 2019-08-20 DIAGNOSIS — Z23 Encounter for immunization: Secondary | ICD-10-CM

## 2019-08-20 MED ORDER — VERAPAMIL HCL ER 180 MG PO TBCR: EXTENDED_RELEASE_TABLET | ORAL | 1 refills | Status: DC

## 2019-08-20 NOTE — Progress Notes (Signed)
   Subjective:    Patient ID: Christine Newman, female    DOB: [birth year], 78 y.o.   MRN: [id number]  Hypertension This is a chronic problem. The current episode started more than 1 year ago. Risk factors for coronary artery disease include post-menopausal state. Treatments tried: verapamil. There are no compliance problems.    Blood pressure medicine and blood pressure levels reviewed today with patient. Compliant with blood pressure medicine. States does not miss a dose. No obvious side effects. Blood pressure generally good when checked elsewhere. Watching salt intake.    bp s overall good   Kidney doc appt soon  Not a flu shot yet Review of Systems     Objective:   Physical Exam  Alert vitals stable, NAD. Blood pressure good on repeat. HEENT normal. Lungs clear. Heart regular rate and rhythm.       Assessment & Plan:  Impression 1 hypertension.  Good control discussed diet discussed medications refilled.  Currently sees renal specialist twice per year and they do her blood work.  Vaccines discussed flu shot today and Prevnar rationale discussed  Follow-up in 6 months

## 2019-08-28 ENCOUNTER — Encounter: Payer: Self-pay | Admitting: Internal Medicine

## 2019-10-02 IMAGING — CT CT ABD-PELV W/O CM
2 of 4 series · 16 of 46 positions shown, 18 images · non-contrast
Comparison: 02/14/2018

CLINICAL DATA: Crohn's disease.  Right lower quadrant pain.

EXAM:
CT ABDOMEN AND PELVIS WITHOUT CONTRAST
TECHNIQUE: Multidetector CT imaging of the abdomen and pelvis was performed
following the standard protocol without IV contrast.

[Series 2: axial st · axial · 0.69mm/px · z∈[-535,-140]mm · 13 of 87 slices shown, 15 images]
[im 4/87  soft-tissue]
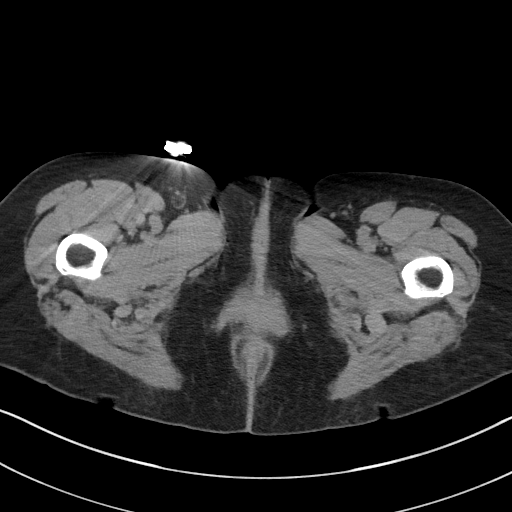
[im 4/87  bone]
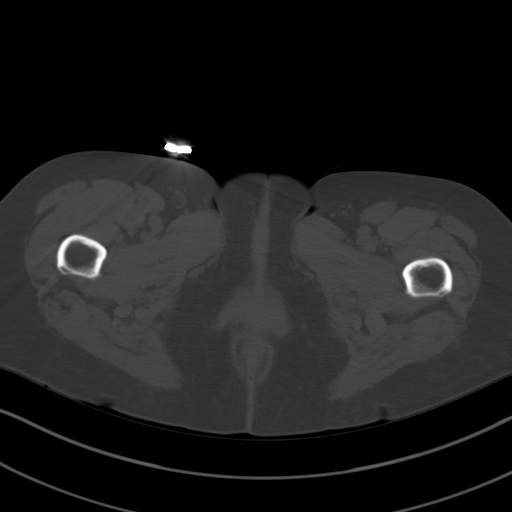
[im 10/87  soft-tissue]
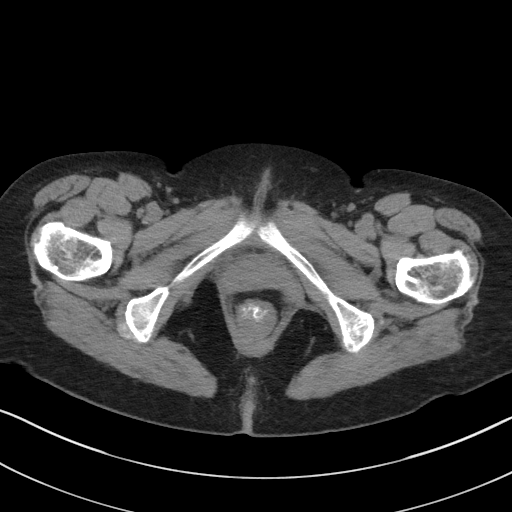
[im 17/87  soft-tissue]
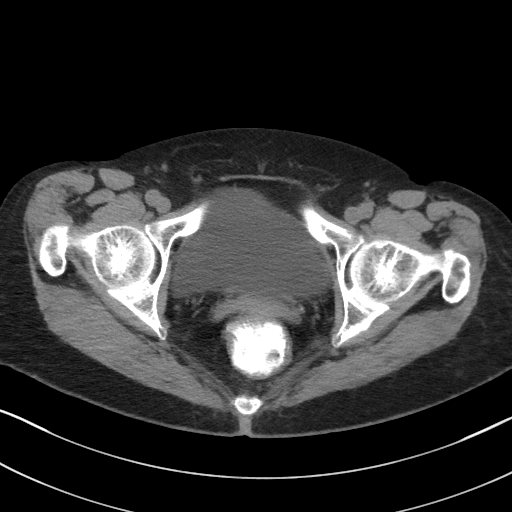
[im 24/87  soft-tissue]
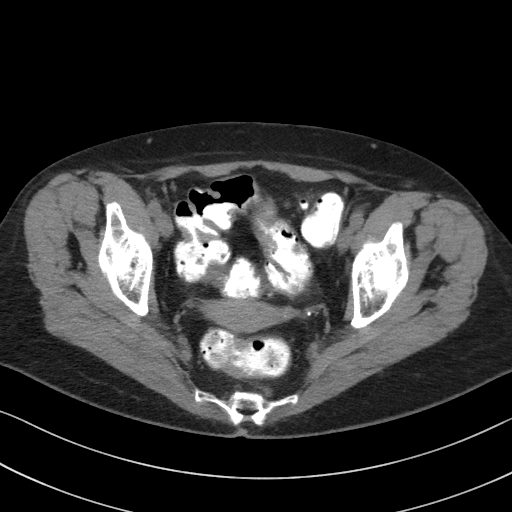
[im 30/87  soft-tissue]
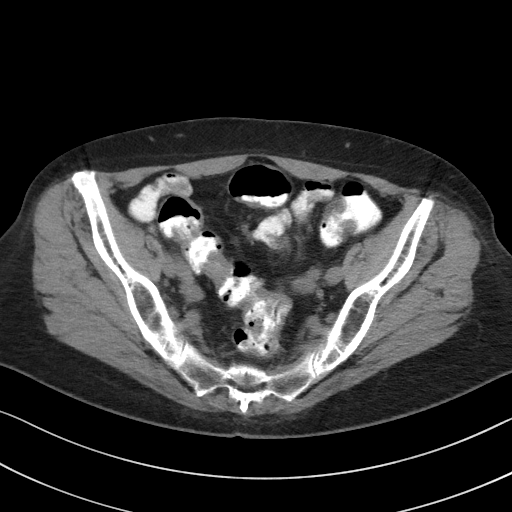
[im 37/87  soft-tissue]
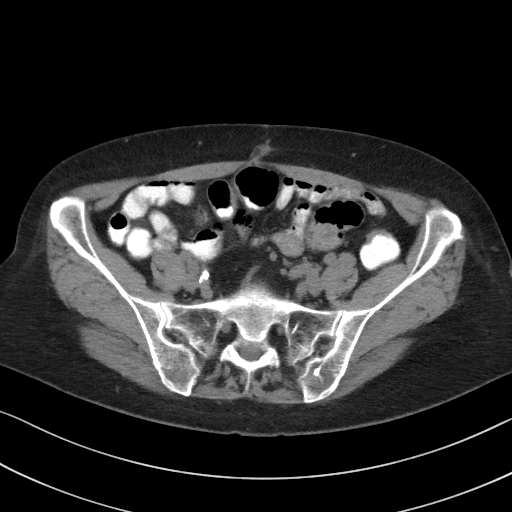
[im 44/87  soft-tissue]
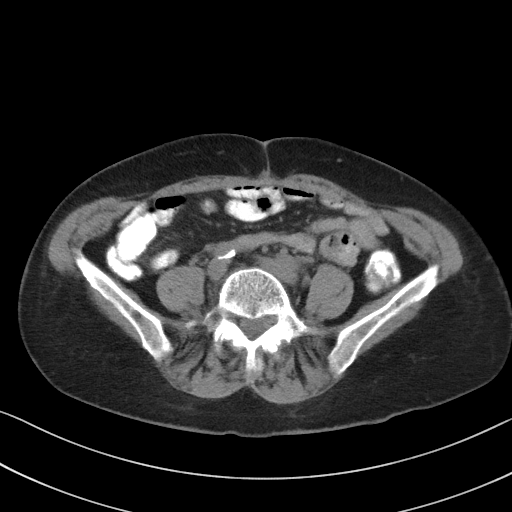
[im 50/87  soft-tissue]
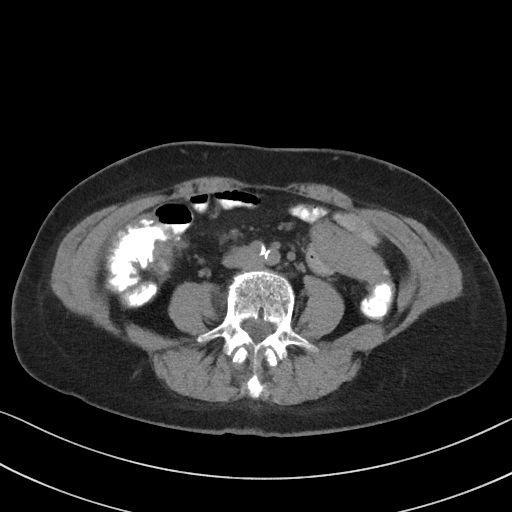
[im 57/87  soft-tissue]
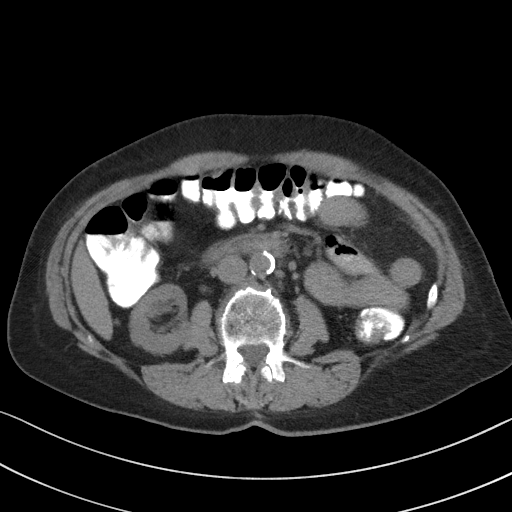
[im 57/87  bone]
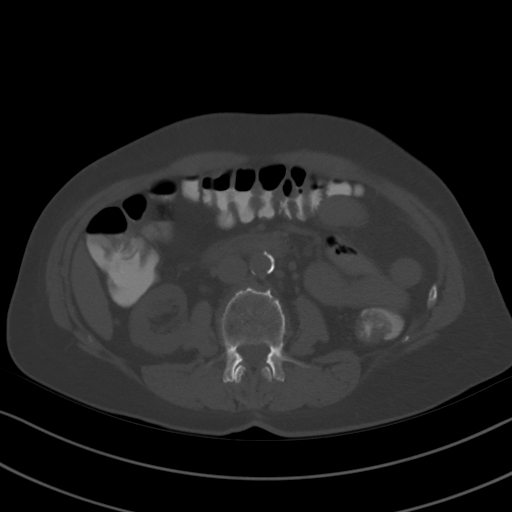
[im 63/87  soft-tissue]
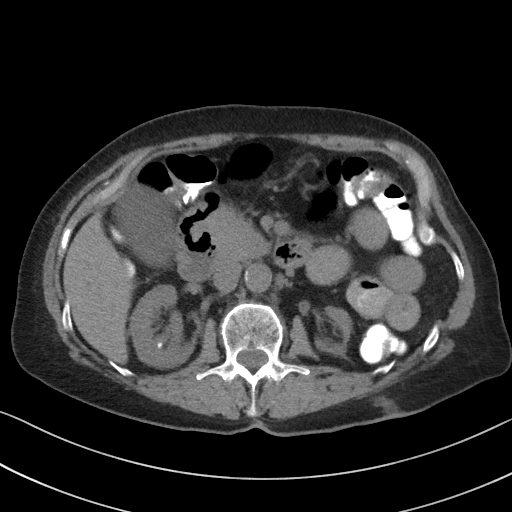
[im 70/87  soft-tissue]
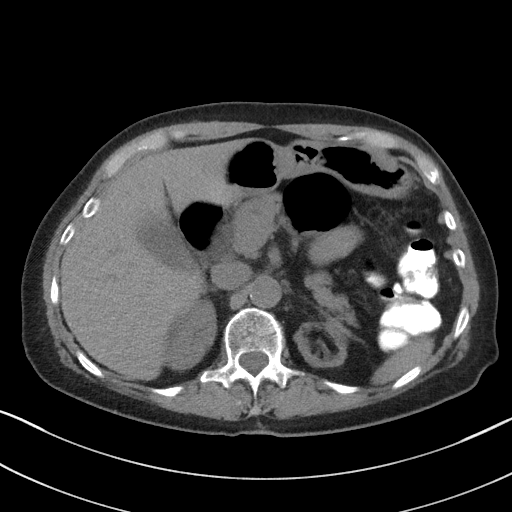
[im 77/87  soft-tissue]
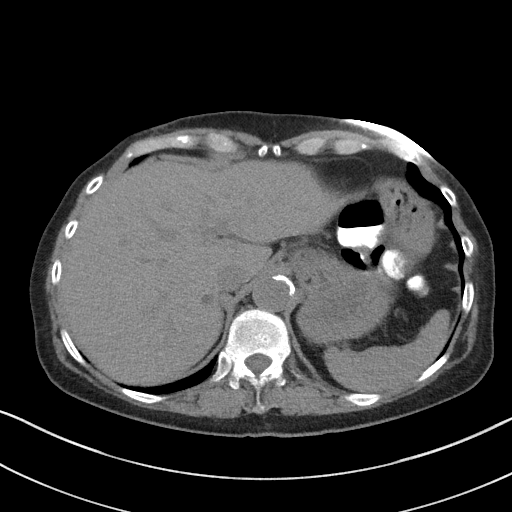
[im 83/87  soft-tissue]
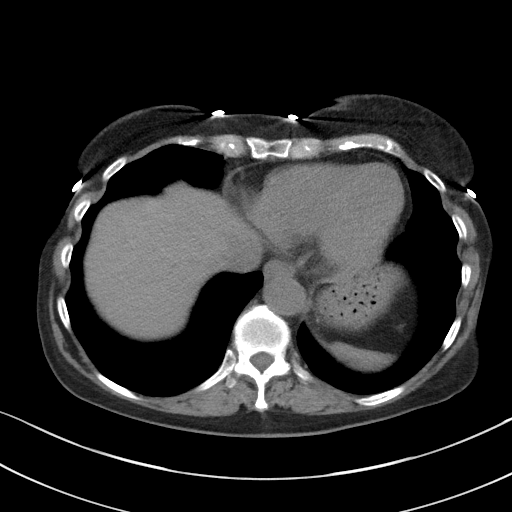

[Series 5: coronal st · coronal · 0.63mm/px · 3 of 68 slices shown]
[im 23/68  soft-tissue]
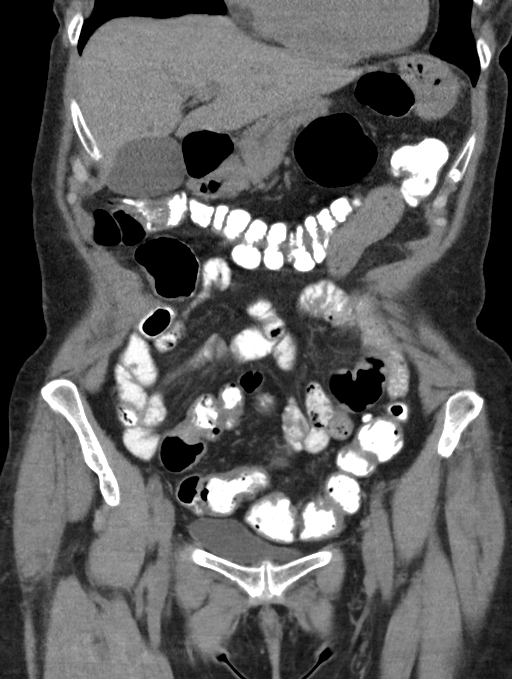
[im 30/68  soft-tissue]
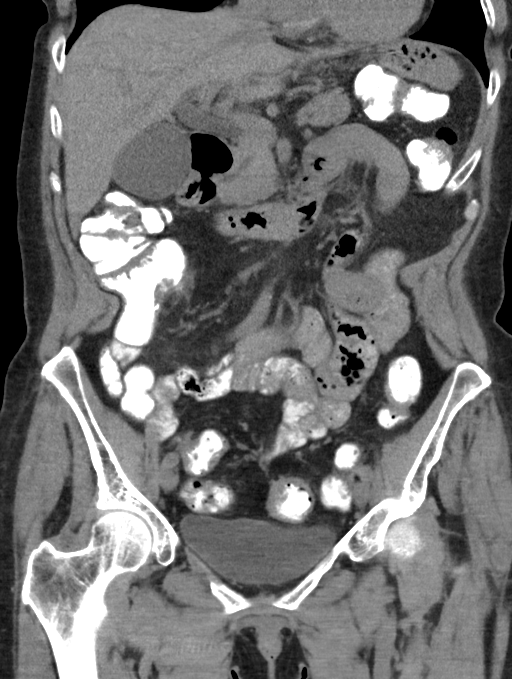
[im 38/68  soft-tissue]
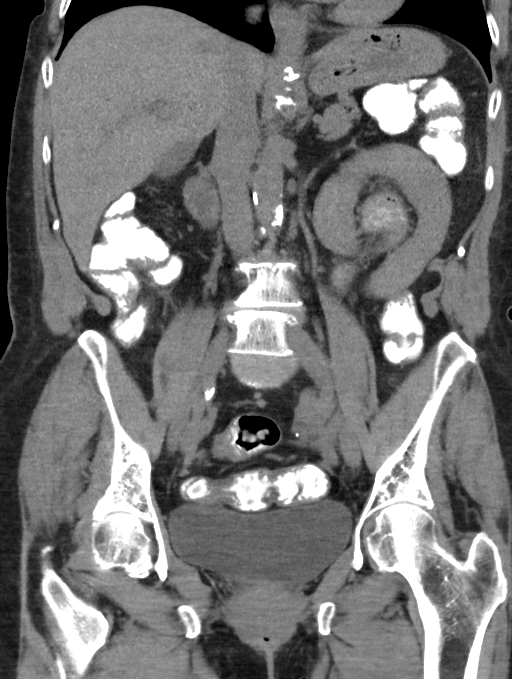

[16 of 46 positions shown; findings below may reference images not displayed]

FINDINGS: Lower chest: No acute abnormality.

Hepatobiliary: Gallbladder is mildly distended but there is no wall
thickening. No obvious gallstones. Liver is unremarkable.

Pancreas: Unremarkable

Spleen: Tiny hypodensity in the central spleen is stable.

Adrenals/Urinary Tract: Severe atrophy of the left kidney. Left
nephrostomy is been removed. Stable 5 mm calculus in the posterior
mid right kidney. No hydronephrosis.

Stomach/Bowel: Postoperative changes in the right lower quadrant
from resection of the cecum are stable. There is slight wall
thickening and there is stranding in the adjacent fat, but these
findings are improved compared with prior studies dated 02/14/2018
and 02/08/2018. There are air-fluid levels in the transverse colon.
No evidence of small-bowel obstruction.

Vascular/Lymphatic: No evidence of aortic aneurysm. Atherosclerotic
aortic and iliac artery calcification. No evidence of aortic
aneurysm.

Reproductive: Uterus and adnexa are within normal limits.

Other: No free fluid.

Musculoskeletal: No vertebral compression deformity. Degenerative
disc disease in the lumbar spine.
IMPRESSION: Postoperative changes in the right lower quadrant. No definite acute
pathology involving bowel.

Left nephrostomy removed.  Left kidney is atrophic.

Right nephrolithiasis.

Mild colonic ileus.

## 2019-10-16 ENCOUNTER — Other Ambulatory Visit: Payer: Self-pay

## 2019-10-16 DIAGNOSIS — N200 Calculus of kidney: Secondary | ICD-10-CM

## 2019-10-29 ENCOUNTER — Encounter: Payer: Self-pay | Admitting: Urology

## 2019-11-15 ENCOUNTER — Encounter: Payer: Self-pay | Admitting: Family Medicine

## 2019-11-20 ENCOUNTER — Other Ambulatory Visit: Payer: Self-pay | Admitting: Family Medicine

## 2019-12-31 DIAGNOSIS — H43811 Vitreous degeneration, right eye: Secondary | ICD-10-CM | POA: Diagnosis not present

## 2020-01-08 ENCOUNTER — Ambulatory Visit (HOSPITAL_COMMUNITY)
Admission: RE | Admit: 2020-01-08 | Discharge: 2020-01-08 | Disposition: A | Discharge status: Home / Self Care | Payer: Medicare PPO | Source: Ambulatory Visit | Attending: Urology | Admitting: Urology

## 2020-01-08 ENCOUNTER — Other Ambulatory Visit: Payer: Self-pay

## 2020-01-08 DIAGNOSIS — N200 Calculus of kidney: Secondary | ICD-10-CM | POA: Diagnosis not present

## 2020-01-09 ENCOUNTER — Ambulatory Visit: Payer: Medicare Other | Admitting: Urology

## 2020-01-28 ENCOUNTER — Telehealth: Payer: Self-pay

## 2020-01-28 NOTE — Telephone Encounter (Signed)
-----   Message from Cleon Gustin, MD sent at 01/27/2020 11:24 AM EDT ----- KUB negative for calculi ----- Message ----- From: Dorisann Frames, RN Sent: 01/08/2020   3:10 PM EDT To: Cleon Gustin, MD  Please review

## 2020-01-28 NOTE — Telephone Encounter (Signed)
Pt notified of results

## 2020-02-11 ENCOUNTER — Ambulatory Visit (INDEPENDENT_AMBULATORY_CARE_PROVIDER_SITE_OTHER): Payer: Medicare Other | Admitting: Urology

## 2020-02-11 ENCOUNTER — Other Ambulatory Visit: Payer: Self-pay

## 2020-02-11 ENCOUNTER — Encounter: Payer: Self-pay | Admitting: Urology

## 2020-02-11 VITALS — BP 155/73 | HR 70 | Temp 97.4°F | Ht 66.0 in | Wt 150.0 lb

## 2020-02-11 DIAGNOSIS — N200 Calculus of kidney: Secondary | ICD-10-CM | POA: Diagnosis not present

## 2020-02-11 LAB — POCT URINALYSIS DIPSTICK
Bilirubin, UA: NEGATIVE
Glucose, UA: NEGATIVE
Ketones, UA: NEGATIVE
Protein, UA: POSITIVE — AB
Spec Grav, UA: >=1.03 — AB (ref 1.010–1.025)
Urobilinogen, UA: 0.2 E.U./dL
pH, UA: 5 (ref 5.0–8.0)

## 2020-02-11 MED ORDER — POTASSIUM CITRATE ER 15 MEQ (1620 MG) PO TBCR: 1.0000 | EXTENDED_RELEASE_TABLET | Freq: Two times a day (BID) | ORAL | 3 refills | Status: DC

## 2020-02-11 NOTE — Patient Instructions (Signed)

## 2020-02-11 NOTE — Progress Notes (Signed)
02/11/2020 3:26 PM   Christine Newman Feb 19, 1941 [id number]  Referring provider: Mikey Kirschner, [address] [place],  [place] [address]  nephrolithiasis  HPI:  Christine Newman is a 79yo with a hx of nephrolithiasis here for followup. No stone events since last visit. No flank pain. No LUTS. KUB shows no calculus.  Here records from [place] are as follows: 02/17/2018: s/p L PCNL. CT showed no residual calculi   02/22/2018: Patient her for stent removal   04/19/2018:Renal US shows right 74m calculus and CT from 6/26 shows a right 579mmid pole calculus.   05/31/2018: 24 hour urine showed volume 1.9L, calcium 80, oxalate 61, undetectable citrate   07/12/2018: Renal [place] KUB show 42m11might lower pole calculus. She is taking Urocit K 53m49mID which was increased by her nephrologist   10/25/2018: KUB shows stable right lower pole 42mm 87mculus. no stone events since last visit. She is on Urocit K 53meq33m. 24 hour urine showed improvement in citrate to 148 from undetectable   05/23/2019: KUB did not show the 42mm ri33m mid pole calculus likely due to overlying bowel gas. She had right flank pain in april/may for 3 days that was severe. She also developed new urinary urgency, frequency and dysuria which resolved over several days     PMH: Past Medical History:  Diagnosis Date  . Anal polyp 2005   condyloma acuminatum  . Anemia   . CKD (chronic kidney disease)    Cr 1.9 -09/2014  . Colonic mass   . Diverticulitis    with abscess  . History of kidney stones   . Hypertension     Surgical History: Past Surgical History:  Procedure Laterality Date  . anal polypectomy  01/02/2004   Dr.Rosenbower- anal polyp removed. bx= condyloma acuminatum  . CATARACT EXTRACTION W/PHACO Left 08/10/2018   Procedure: CATARACT EXTRACTION PHACO AND INTRAOCULAR LENS PLACEMENT LEFT EYE;  Surgeon: Hunt, KTonny BranchLocation: [facility];  Service: Ophthalmology;  Laterality: Left;  left  .  CATARACT EXTRACTION W/PHACO Right 09/04/2018   Procedure: CATARACT EXTRACTION PHACO AND INTRAOCULAR LENS PLACEMENT (IOC);  Surgeon: Hunt, KTonny BranchLocation: [facility];  Service: Ophthalmology;  Laterality: Right;  CDE: 11.16  . COLONOSCOPY  04/08/2003   Dr.Mann- small nonbleeding internal and external hemorrhoids small polypoid mass at anal verge, normal appearing L colon, transverse colon, R colon including cecum. stenotic ileocecal valve. bx of maa= benign rectal type mucosa with features of mucosal prolapse syndrome  . COLONOSCOPY N/A 04/02/2015   Dr.Rourk- colonic diverticulosis, no evidence of colonic neoplasm. abnormal ileocecal valve with ulceration bx= ulcerated/eroded benign ileocecal valve mucosa  . COLONOSCOPY N/A 06/14/2018   scattered diverticula in sigmoid and descending colon. Distal 10 cm of neoterminal ileum appears normal. Appears to have surgical remission  . FLEXIBLE SIGMOIDOSCOPY  11/06/2003   Dr.Mann- small nodular mass at anal verge, bx done. early L side diverticula. bx= benign rectal mucosa with features of mucosal prolapse syndrome. no adenomatous changes or evidence of malignancy identified.   . IR NEPHROSTOMY PLACEMENT LEFT  01/05/2018  . KIDNEY STONE SURGERY    . LAPAROSCOPY N/A 11/02/2017   Procedure: LAPAROSCOPIC ILEOCECECTOMY;  Surgeon: Thomas,Leighton RuffLocation: [facility];  Service: General;  Laterality: N/A;  . NEPHROLITHOTOMY Left 02/13/2018   Procedure: NEPHROLITHOTOMY PERCUTANEOUS;  Surgeon: McKenziCleon GustinLocation: [facility];  Service: Urology;  Laterality: Left;    Home Medications:  Allergies as of  02/11/2020      Reactions   No Known Allergies       Medication List       Accurate as of Feb 11, 2020  3:26 PM. If you have any questions, ask your nurse or doctor.        cholecalciferol 1000 units tablet Commonly known as: VITAMIN D Take 1,000 Units by mouth daily.   diclofenac Sodium 1 % Gel Commonly known as: VOLTAREN Apply 1 g topically 3  (three) times daily.   folic acid 941 MCG tablet Commonly known as: FOLVITE Take 400 mcg by mouth daily.   multivitamin with minerals tablet Take 1 tablet by mouth daily.   Urocit-K 15 15 MEQ (1620 MG) Tbcr Generic drug: Potassium Citrate Take 1 tablet by mouth 2 (two) times daily. What changed: Another medication with the same name was removed. Continue taking this medication, and follow the directions you see here. Changed by: Nicolette Bang, MD   verapamil 180 MG CR tablet Commonly known as: CALAN-SR TAKE 1 TABLET BY MOUTH TWICE DAILY. What changed: Another medication with the same name was removed. Continue taking this medication, and follow the directions you see here. Changed by: Nicolette Bang, MD   vitamin B-12 1000 MCG tablet Commonly known as: CYANOCOBALAMIN Take 1,000 mcg by mouth daily.       Allergies:  Allergies  Allergen Reactions  . No Known Allergies     Family History: Family History  Problem Relation Age of Onset  . Hypertension Mother   . Heart disease Father   . Cancer Sister        breast  . Colon cancer Neg Hx   . Colon polyps Neg Hx     Social History:  reports that she quit smoking about 58 years ago. Her smoking use included cigarettes. She has a 1.25 pack-year smoking history. She has never used smokeless tobacco. She reports that she does not drink alcohol or use drugs.  ROS: All other review of systems were reviewed and are negative except what is noted above in HPI  Physical Exam: BP (!) 155/73   Pulse 70   Temp (!) 97.4 F (36.3 C)   Ht 5' 6"  (1.676 m)   Wt 150 lb (68 kg)   LMP  (LMP Unknown)   BMI 24.21 kg/m   Constitutional:  Alert and oriented, No acute distress. HEENT: [place] AT, moist mucus membranes.  Trachea midline, no masses. Cardiovascular: No clubbing, cyanosis, or edema. Respiratory: Normal respiratory effort, no increased work of breathing. GI: Abdomen is soft, nontender, nondistended, no abdominal masses GU:  No CVA tenderness.  Lymph: No cervical or inguinal lymphadenopathy. Skin: No rashes, bruises or suspicious lesions. Neurologic: Grossly intact, no focal deficits, moving all 4 extremities. Psychiatric: Normal mood and affect.  Laboratory Data: Lab Results  Component Value Date   WBC 4.8 04/05/2018   HGB 12.5 04/05/2018   HCT 40.5 04/05/2018   MCV 95.5 04/05/2018   PLT 216 04/05/2018    Lab Results  Component Value Date   CREATININE 1.51 (H) 06/09/2018    No results found for: PSA  No results found for: TESTOSTERONE  No results found for: HGBA1C  Urinalysis    Component Value Date/Time   COLORURINE YELLOW 01/05/2018 1404   APPEARANCEUR HAZY (A) 01/05/2018 1404   LABSPEC 1.018 01/05/2018 1404   PHURINE 5.0 01/05/2018 1404   GLUCOSEU NEGATIVE 01/05/2018 1404   HGBUR MODERATE (A) 01/05/2018 1404   BILIRUBINUR NEGATIVE 01/05/2018 1404  KETONESUR NEGATIVE 01/05/2018 1404   PROTEINUR NEGATIVE 01/05/2018 1404   UROBILINOGEN 0.2 01/13/2015 1908   NITRITE NEGATIVE 01/05/2018 1404   LEUKOCYTESUR LARGE (A) 01/05/2018 1404    Lab Results  Component Value Date   BACTERIA RARE (A) 01/05/2018    Pertinent Imaging: KUB 01/08/2020: No calculi visualized. Images reviewed and discussed with patient Results for orders placed during the hospital encounter of 01/08/20  Abdomen 1 view (KUB)   Narrative CLINICAL DATA:  Nephrolithiasis  EXAM: ABDOMEN - 1 VIEW  COMPARISON:  05/16/2019  FINDINGS: The bowel gas pattern is normal. No radio-opaque calculi or other significant radiographic abnormality are seen. Suture line in the right hemiabdomen.  IMPRESSION: Previously seen right-sided nephrolithiasis is not definitively visualized.   Electronically Signed   By: Davina Poke D.O.   On: 01/08/2020 14:54    No results found for this or any previous visit. No results found for this or any previous visit. No results found for this or any previous visit. Results for  orders placed during the hospital encounter of 07/03/18  US RENAL   Narrative CLINICAL DATA:  79 year old female with right kidney stone. Six-month follow-up.  EXAM: RENAL / URINARY TRACT ULTRASOUND COMPLETE  COMPARISON:  04/19/2018 renal sonogram. 04/05/2018 CT. Plain film of the abdomen same date dictated separately.  FINDINGS: Right Kidney:  Length: 9.8 cm. Slight increased renal parenchyma echogenicity. No hydronephrosis. 6 mm nonobstructing stone mid aspect. No right renal mass identified.  Left Kidney:  Length: 7.8 cm. Atrophic and difficult to adequately assess. No obvious mass or hydronephrosis.  Bladder:  Not evaluated as empty.  IMPRESSION: 1. 6 mm nonobstructing right renal calculi. No right-sided hydronephrosis. Mild increased renal parenchyma echogenicity of the right kidney may represent changes of mild medical renal disease. 2. Atrophic left kidney difficult to visualize. No obvious left-sided hydronephrosis or mass.   Electronically Signed   By: Genia Del M.D.   On: 07/03/2018 16:35    No results found for this or any previous visit. No results found for this or any previous visit. Results for orders placed during the hospital encounter of 02/13/18  CT RENAL STONE STUDY   Narrative CLINICAL DATA:  Left-sided pain. Reported left percutaneous nephrolithotomy 1 day prior.  EXAM: CT ABDOMEN AND PELVIS WITHOUT CONTRAST  TECHNIQUE: Multidetector CT imaging of the abdomen and pelvis was performed following the standard protocol without IV contrast.  COMPARISON:  02/08/2018 CT abdomen/pelvis.  FINDINGS: Lower chest: Subsegmental dependent bibasilar atelectasis. Subcentimeter calcified medial left lower lobe granuloma. Coronary atherosclerosis.  Hepatobiliary: Normal liver size. Simple 1.0 cm posterior right liver lobe cyst. Subcentimeter hypodense liver lesions near the gallbladder fossa (series 2/image 21), too small to  characterize, stable, probably benign. No new liver lesions. Mildly distended gallbladder. No radiopaque cholelithiasis. No gallbladder wall thickening. No pericholecystic fluid. No biliary ductal dilatation.  Pancreas: Normal, with no mass or duct dilation.  Spleen: Normal size. No mass.  Adrenals/Urinary Tract: Normal adrenals. Asymmetric severe left renal parenchymal atrophy. Large bore left percutaneous nephrostomy tube terminates in the left renal pelvis. Well-positioned left nephroureteral stent with proximal pigtail portion in the left renal pelvis and distal pigtail portion in the bladder. Expected gas scattered in the left renal collecting system. A few mildly dilated calices in the left renal collecting system. Left renal pelvis collapse. Nonspecific haziness of the central left renal sinus fat, presumably inflammatory or due to postprocedure change. No residual stone fragments in the left renal collecting system or left ureter. Mild  ill-defined fluid and haziness in the left perinephric retroperitoneum. No contour deforming left renal mass.  Nonobstructing 4 mm interpolar and 2 mm lower right renal stones. Small simple parapelvic right renal cysts. No right hydronephrosis. Normal caliber right ureter, with no right ureteral stones. No contour deforming right renal mass.  Collapsed bladder. No bladder stones. Expected gas in the nondependent bladder from instrumentation.  Stomach/Bowel: Normal non-distended stomach. Stable postsurgical changes from ileocecal resection with intact appearing neo ileocolic anastomosis in the right abdomen. No small bowel wall thickening. A few mildly dilated small bowel loops in the right abdomen up to the 4.3 cm diameter without focal small bowel caliber transition. Remnant large bowel is normal caliber with no wall thickening or significant pericolonic fat stranding.  Vascular/Lymphatic: Atherosclerotic nonaneurysmal abdominal  aorta. No pathologically enlarged lymph nodes in the abdomen or pelvis.  Reproductive: Grossly normal uterus.  No adnexal mass.  Other: No pneumoperitoneum, ascites or focal fluid collection.  Musculoskeletal: No aggressive appearing focal osseous lesions. Moderate lumbar spondylosis.  IMPRESSION: 1. Expected postprocedural change status post left percutaneous nephrolithotomy. No residual stone fragments in the left renal collecting system or left ureter. Well-positioned left percutaneous nephrostomy tube and left nephroureteral stent. A few dilated calices in the left renal collecting system with nondilated left renal pelvis. No focal fluid collections. 2. Nonobstructing right nephrolithiasis. 3. Mildly dilated right abdominal small bowel loops, favor mild post procedural ileus. 4.  Aortic Atherosclerosis (ICD10-I70.0).   Electronically Signed   By: Ilona Sorrel M.D.   On: 02/14/2018 13:16     Assessment & Plan:    1. Nephrolithiasis -continue urocit K BID -RTC 1 year with KUB   No follow-ups on file.  Nicolette Bang, MD  [facility] [place]

## 2020-02-11 NOTE — Progress Notes (Signed)
Urological Symptom Review  Patient is experiencing the following symptoms: Get up at night to urinate Urinary tract infection   Review of Systems  Gastrointestinal (upper)  : Negative for upper GI symptoms  Gastrointestinal (lower) : Diarrhea  Constitutional : Negative for symptoms  Skin: Negative for skin symptoms  Eyes: Negative for eye symptoms  Ear/Nose/Throat : Negative for Ear/Nose/Throat symptoms  Hematologic/Lymphatic: Negative for Hematologic/Lymphatic symptoms  Cardiovascular : Negative for cardiovascular symptoms  Respiratory : Negative for respiratory symptoms  Endocrine: Negative for endocrine symptoms  Musculoskeletal: Joint pain  Neurological: Negative for neurological symptoms  Psychologic: Negative for psychiatric symptoms

## 2020-02-18 ENCOUNTER — Other Ambulatory Visit: Payer: Self-pay

## 2020-02-18 ENCOUNTER — Ambulatory Visit (INDEPENDENT_AMBULATORY_CARE_PROVIDER_SITE_OTHER): Payer: Medicare Other | Admitting: Family Medicine

## 2020-02-18 ENCOUNTER — Encounter: Payer: Self-pay | Admitting: Family Medicine

## 2020-02-18 VITALS — BP 138/82 | Temp 97.5°F | Wt 151.2 lb

## 2020-02-18 DIAGNOSIS — I10 Essential (primary) hypertension: Secondary | ICD-10-CM

## 2020-02-18 DIAGNOSIS — N289 Disorder of kidney and ureter, unspecified: Secondary | ICD-10-CM

## 2020-02-18 MED ORDER — VERAPAMIL HCL ER 180 MG PO TBCR: EXTENDED_RELEASE_TABLET | ORAL | 1 refills | Status: DC

## 2020-02-18 NOTE — Progress Notes (Signed)
   Subjective:    Patient ID: Christine Newman, female    DOB: 13-Jun-1941, 79 y.o.   MRN: [id number]  Hypertension This is a chronic problem. There are no compliance problems.   Pt states she does not keep check on blood pressure but has not had any issues. Pt is taking Verapamil 180 mg BID along with Potassium 15 mEq BID.   Blood pressure medicine and blood pressure levels reviewed today with patient. Compliant with blood pressure medicine. States does not miss a dose. No obvious side effects. Blood pressure generally good when checked elsewhere. Watching salt intake.  Walks nearly every day   Developed foot pain  Due to see apoidatrist for her foot pain  Pt has had both covid vaccines, had the moderna   Review of Systems No headache, no major weight loss or weight gain, no chest pain no back pain abdominal pain no change in bowel habits complete ROS otherwise negative     Objective:   Physical Exam  Alert vitals stable, NAD. Blood pressure good on repeat. HEENT normal. Lungs clear. Heart regular rate and rhythm.       Assessment & Plan:  Impression 1 essential hypertension.  Blood pressure good on repeat to maintain same dose meds diet exercise discussed.  6 months worth written follow-up in 6 months  2.  Chronic kidney disease followed by specialist per patient they do all of her blood work

## 2020-02-28 DIAGNOSIS — M1712 Unilateral primary osteoarthritis, left knee: Secondary | ICD-10-CM | POA: Diagnosis not present

## 2020-08-11 ENCOUNTER — Encounter: Payer: Self-pay | Admitting: Internal Medicine

## 2020-08-20 ENCOUNTER — Other Ambulatory Visit: Payer: Self-pay

## 2020-08-20 ENCOUNTER — Ambulatory Visit (INDEPENDENT_AMBULATORY_CARE_PROVIDER_SITE_OTHER): Payer: Medicare Other | Admitting: Family Medicine

## 2020-08-20 VITALS — BP 140/88 | HR 78 | Temp 97.6°F | Ht 66.0 in | Wt 148.6 lb

## 2020-08-20 DIAGNOSIS — Z23 Encounter for immunization: Secondary | ICD-10-CM

## 2020-08-20 DIAGNOSIS — I10 Essential (primary) hypertension: Secondary | ICD-10-CM

## 2020-08-20 MED ORDER — METOPROLOL SUCCINATE ER 25 MG PO TB24: 25.0000 mg | ORAL_TABLET | Freq: Every day | ORAL | 1 refills | Status: DC

## 2020-08-20 NOTE — Progress Notes (Deleted)
error 

## 2020-08-20 NOTE — Patient Instructions (Addendum)
Goal blood pressure is 120-130/ 80s.  If seeing numbers over 150/90 call office.  If seeing numbers under 90/60 then call office  Start new medication metoprolol in AM with other meds.

## 2020-08-20 NOTE — Progress Notes (Signed)
Patient ID: Christine Newman, female    DOB: [birth year], 79 y.o.   MRN: [id number]   Chief Complaint  Patient presents with   Hypertension    follow up   Subjective:    HPI Cc- f/u htn. Pt stating she is taking bp meds at 8am. Taking verapamil 133m bid.  Potassium taking it 2x per day. Seeing kidney doctor in [place] next month.  Stable and Cr 1.51. Seeing them q69moAnd seeing Dr. McAlyson Inglesith urology, due to kidney stones. Cr 1.51. Pt denies chest pain, sob, LE swelling, ha, or blurry vision.  Pt admitting that at home bp seeing numbers 140-150s.  Even some times seeing 160s.  Just lays down and it comes down. Not really seeing low numbers. No chest pain, sob, leg swelling, or HA.   Medical History NaJolondaas a past medical history of Anal polyp (2005), Anemia, CKD (chronic kidney disease), Colonic mass, Diverticulitis, History of kidney stones, and Hypertension.   Outpatient Encounter Medications as of 08/20/2020  Medication Sig   cholecalciferol (VITAMIN D) 1000 units tablet Take 1,000 Units by mouth daily.   diclofenac Sodium (VOLTAREN) 1 % GEL Apply 1 g topically 3 (three) times daily. (Patient not taking: Reported on 1136/46/8032  folic acid (FOLVITE) 40122CG tablet Take 400 mcg by mouth daily.   metoprolol succinate (TOPROL-XL) 25 MG 24 hr tablet Take 1 tablet (25 mg total) by mouth daily.   Multiple Vitamins-Minerals (MULTIVITAMIN WITH MINERALS) tablet Take 1 tablet by mouth daily.   Potassium Citrate (UROCIT-K 15) 15 MEQ (1620 MG) TBCR Take 1 tablet by mouth 2 (two) times daily.   verapamil (CALAN-SR) 180 MG CR tablet TAKE 1 TABLET BY MOUTH TWICE DAILY.   vitamin B-12 (CYANOCOBALAMIN) 1000 MCG tablet Take 1,000 mcg by mouth daily.   No facility-administered encounter medications on file as of 08/20/2020.     Review of Systems  Constitutional: Negative for chills and fever.  HENT: Negative for congestion, rhinorrhea and sore throat.     Respiratory: Negative for cough, shortness of breath and wheezing.   Cardiovascular: Negative for chest pain and leg swelling.  Gastrointestinal: Negative for abdominal pain, diarrhea, nausea and vomiting.  Genitourinary: Negative for dysuria and frequency.  Musculoskeletal: Negative for arthralgias and back pain.  Skin: Negative for rash.  Neurological: Negative for dizziness, weakness and headaches.     Vitals BP 140/88    Pulse 78    Temp 97.6 F (36.4 C) (Oral)    Ht 5' 6"  (1.676 m)    Wt 148 lb 9.6 oz (67.4 kg)    LMP  (LMP Unknown)    SpO2 98%    BMI 23.98 kg/m   Objective:   Physical Exam Vitals and nursing note reviewed.  Constitutional:      Appearance: Normal appearance.  HENT:     Head: Normocephalic and atraumatic.     Nose: Nose normal.     Mouth/Throat:     Mouth: Mucous membranes are moist.     Pharynx: Oropharynx is clear.  Eyes:     Extraocular Movements: Extraocular movements intact.     Conjunctiva/sclera: Conjunctivae normal.     Pupils: Pupils are equal, round, and reactive to light.  Cardiovascular:     Rate and Rhythm: Normal rate and regular rhythm.     Pulses: Normal pulses.     Heart sounds: Normal heart sounds. No murmur heard.   Pulmonary:     Effort: Pulmonary effort is normal.  Breath sounds: Normal breath sounds. No wheezing, rhonchi or rales.  Musculoskeletal:        General: Normal range of motion.     Right lower leg: No edema.     Left lower leg: No edema.  Skin:    General: Skin is warm and dry.     Findings: No lesion or rash.  Neurological:     General: No focal deficit present.     Mental Status: She is alert and oriented to person, place, and time.  Psychiatric:        Behavior: Behavior normal.        Thought Content: Thought content normal.        Judgment: Judgment normal.     Comments: +anxious      Assessment and Plan   1. Essential hypertension, benign - CBC - Lipid panel  2. Need for vaccination - Flu  Vaccine QUAD High Dose(Fluad)   htn- suboptimal-  Pt stating has a lot of anxiety when coming to doctor's offices. Today on recheck still at 140/92.  Will add metoprolol to am, 13m XL. Cont verapamil 1821mbid.  Check at home bp  2x per day.  Call if seeing numbers 150/90 or above or under 90/60 or if feeling badly on new medications.  Labs ordered cbc, lipids.  F/u 19m91mo prn.

## 2020-08-21 LAB — LIPID PANEL
Chol/HDL Ratio: 3 ratio (ref 0.0–4.4)
Cholesterol, Total: 180 mg/dL (ref 100–199)
HDL: 60 mg/dL (ref >39)
LDL Chol Calc (NIH): 97 mg/dL (ref 0–99)
Triglycerides: 133 mg/dL (ref 0–149)
VLDL Cholesterol Cal: 23 mg/dL (ref 5–40)

## 2020-08-21 LAB — CBC
Hematocrit: 42.9 % (ref 34.0–46.6)
Hemoglobin: 13.8 g/dL (ref 11.1–15.9)
MCH: 30.5 pg (ref 26.6–33.0)
MCHC: 32.2 g/dL (ref 31.5–35.7)
MCV: 95 fL (ref 79–97)
Platelets: 273 10*3/uL (ref 150–450)
RBC: 4.53 x10E6/uL (ref 3.77–5.28)
RDW: 13.1 % (ref 11.7–15.4)
WBC: 4.7 10*3/uL (ref 3.4–10.8)

## 2020-09-01 NOTE — Progress Notes (Deleted)
error 

## 2020-09-23 ENCOUNTER — Other Ambulatory Visit: Payer: Self-pay

## 2020-09-23 ENCOUNTER — Ambulatory Visit (INDEPENDENT_AMBULATORY_CARE_PROVIDER_SITE_OTHER): Payer: Medicare Other | Admitting: Internal Medicine

## 2020-09-23 ENCOUNTER — Encounter: Payer: Self-pay | Admitting: Internal Medicine

## 2020-09-23 VITALS — BP 162/84 | HR 64 | Temp 96.9°F | Ht 66.0 in | Wt 146.4 lb

## 2020-09-23 DIAGNOSIS — K5090 Crohn's disease, unspecified, without complications: Secondary | ICD-10-CM | POA: Diagnosis not present

## 2020-09-23 NOTE — Patient Instructions (Signed)
We will see you back in 1 year  Continue B12 supplementation  Avoid NSAIDs like ibuprofen and Naprosyn is much as possible  Discussed, where not treating you for Crohn's disease with any medication at this time.  Hopefully surgery has treated this disease effectively for the long haul.  Crohn's disease is not cured with surgery or any other medication.  Hopefully, you are in a long-term remission.  Please get a bone density study in the next year  If you have any interim problems, please contact us.

## 2020-09-23 NOTE — Progress Notes (Signed)
Primary Care Physician:  Erven Colla, DO Primary Gastroenterologist:  Dr. Gala Romney  Pre-Procedure History & Physical: HPI:  Christine Newman is a 79 y.o. female here for follow-up-history of inflammatory enteritis.  Seen 1 year ago.  She is clinically doing very well having 2-3 formed bowel movements daily no bleeding.  Distant history of diverticulitis with abscess requiring colonic resection.  January 2019 underwent ileocecectomy by Dr. Marcello Moores for inflammatory enteritis.  Findings on path suggestive of Crohn's disease.  Colonoscopy in September 2019 included neoterminal ileoscopy demonstrated normal residual small bowel.  No abdominal pain nausea or vomiting.  She has lost 7 pounds since her last visit.  She says she feels well and eat what she wants to.  No upper GI tract symptoms.  Has not had a bone density study in a number of years as she reports.  She takes B12 chronically.  No NSAIDs. Past Medical History:  Diagnosis Date  . Anal polyp 2005   condyloma acuminatum  . Anemia   . CKD (chronic kidney disease)    Cr 1.9 -09/2014  . Colonic mass   . Diverticulitis    with abscess  . History of kidney stones   . Hypertension     Past Surgical History:  Procedure Laterality Date  . anal polypectomy  01/02/2004   Dr.Rosenbower- anal polyp removed. bx= condyloma acuminatum  . CATARACT EXTRACTION W/PHACO Left 08/10/2018   Procedure: CATARACT EXTRACTION PHACO AND INTRAOCULAR LENS PLACEMENT LEFT EYE;  Surgeon: Tonny Branch, MD;  Location: [facility];  Service: Ophthalmology;  Laterality: Left;  left  . CATARACT EXTRACTION W/PHACO Right 09/04/2018   Procedure: CATARACT EXTRACTION PHACO AND INTRAOCULAR LENS PLACEMENT (IOC);  Surgeon: Tonny Branch, MD;  Location: [facility];  Service: Ophthalmology;  Laterality: Right;  CDE: 11.16  . COLONOSCOPY  04/08/2003   Dr.Mann- small nonbleeding internal and external hemorrhoids small polypoid mass at anal verge, normal appearing L colon, transverse  colon, R colon including cecum. stenotic ileocecal valve. bx of maa= benign rectal type mucosa with features of mucosal prolapse syndrome  . COLONOSCOPY N/A 04/02/2015   Dr.Jefferson Fullam- colonic diverticulosis, no evidence of colonic neoplasm. abnormal ileocecal valve with ulceration bx= ulcerated/eroded benign ileocecal valve mucosa  . COLONOSCOPY N/A 06/14/2018   scattered diverticula in sigmoid and descending colon. Distal 10 cm of neoterminal ileum appears normal. Appears to have surgical remission  . FLEXIBLE SIGMOIDOSCOPY  11/06/2003   Dr.Mann- small nodular mass at anal verge, bx done. early L side diverticula. bx= benign rectal mucosa with features of mucosal prolapse syndrome. no adenomatous changes or evidence of malignancy identified.   . IR NEPHROSTOMY PLACEMENT LEFT  01/05/2018  . KIDNEY STONE SURGERY    . LAPAROSCOPY N/A 11/02/2017   Procedure: LAPAROSCOPIC ILEOCECECTOMY;  Surgeon: Leighton Ruff, MD;  Location: [facility];  Service: General;  Laterality: N/A;  . NEPHROLITHOTOMY Left 02/13/2018   Procedure: NEPHROLITHOTOMY PERCUTANEOUS;  Surgeon: Cleon Gustin, MD;  Location: [facility];  Service: Urology;  Laterality: Left;    Prior to Admission medications   Medication Sig Start Date End Date Taking? Authorizing Provider  cholecalciferol (VITAMIN D) 1000 units tablet Take 1,000 Units by mouth daily.   Yes [provider]  folic acid (FOLVITE) 967 MCG tablet Take 400 mcg by mouth daily.   Yes [provider]  metoprolol succinate (TOPROL-XL) 25 MG 24 hr tablet Take 1 tablet (25 mg total) by mouth daily. 08/20/20  Yes Lovena Le, Malena M, DO  Multiple Vitamins-Minerals (MULTIVITAMIN  WITH MINERALS) tablet Take 1 tablet by mouth daily.   Yes [provider]  Potassium Citrate (UROCIT-K 15) 15 MEQ (1620 MG) TBCR Take 1 tablet by mouth 2 (two) times daily. 02/11/20  Yes McKenzie, Candee Furbish, MD  verapamil (CALAN-SR) 180 MG CR tablet TAKE 1 TABLET BY MOUTH TWICE DAILY. 02/18/20   Yes Mikey Kirschner, MD  vitamin B-12 (CYANOCOBALAMIN) 1000 MCG tablet Take 1,000 mcg by mouth daily.   Yes [provider]    Allergies as of 09/23/2020 - Review Complete 09/23/2020  Allergen Reaction Noted  . No known allergies  02/11/2020    Family History  Problem Relation Age of Onset  . Hypertension Mother   . Heart disease Father   . Cancer Sister        breast  . Colon cancer Neg Hx   . Colon polyps Neg Hx     Social History   Socioeconomic History  . Marital status: Widowed    Spouse name: Not on file  . Number of children: Not on file  . Years of education: Not on file  . Highest education level: Not on file  Occupational History  . Not on file  Tobacco Use  . Smoking status: Former Smoker    Packs/day: 0.25    Years: 5.00    Pack years: 1.25    Types: Cigarettes    Quit date: 01/18/1962    Years since quitting: 58.7  . Smokeless tobacco: Never Used  . Tobacco comment: 1 pack cigarettes per week ; quit in her early 57s   Vaping Use  . Vaping Use: Never used  Substance and Sexual Activity  . Alcohol use: No  . Drug use: Never  . Sexual activity: Not Currently    Birth control/protection: Post-menopausal  Other Topics Concern  . Not on file  Social History Narrative  . Not on file   Social Determinants of Health   Financial Resource Strain: Not on file  Food Insecurity: Not on file  Transportation Needs: Not on file  Physical Activity: Not on file  Stress: Not on file  Social Connections: Not on file  Intimate Partner Violence: Not on file    Review of Systems: See HPI, otherwise negative ROS  Physical Exam: BP (!) 162/84   Pulse 64   Temp (!) 96.9 F (36.1 C) (Temporal)   Ht 5' 6"  (1.676 m)   Wt 146 lb 6.4 oz (66.4 kg)   LMP  (LMP Unknown)   BMI 23.63 kg/m  General:   Alert,  Well-developed, well-nourished, pleasant and cooperative in NAD Neck:  Supple; no masses or thyromegaly. No significant cervical  adenopathy. Abdomen: Nondistended.  Positive bowel sounds soft nontender without appreciable mass organomegaly Pulses:  Normal pulses noted. Extremities:  Without clubbing or edema.  Impression/Plan: 79 year old lady with a history of inflammatory ileitis requiring ileocecectomy almost 3 years ago.  Colonoscopy about 2-1/2 years ago including ileoscopy demonstrated no evidence of inflammatory bowel disease (or other abnormality). I suspect most likely Crohn's ileitis is an accurate diagnosis.  Hopefully, she will have a long-term surgical remission.  No treatment specifically for Crohn's indicated at this time. As discussed, we will follow clinically.  She is due for a bone density study.  I am glad to see she is taking B12 supplementation already  She should avoid NSAIDs as much possible.  Recommendations:  We will see you back in 1 year  Continue B12 supplementation  Avoid NSAIDs like ibuprofen and Naprosyn  is much as possible  Discussed, no treatment for Crohn's disease with any medication at this time.  Hopefully surgery has treated this disease effectively for the long haul.  Crohn's disease is not cured with surgery or any other medication.  Hopefully, you are in a long-term remission.  Please get a bone density study in the next year  If you have any interim problems, please contact us.     Notice: This dictation was prepared with Dragon dictation along with smaller phrase technology. Any transcriptional errors that result from this process are unintentional and may not be corrected upon review.

## 2020-10-08 ENCOUNTER — Other Ambulatory Visit: Payer: Self-pay

## 2020-10-08 ENCOUNTER — Ambulatory Visit (INDEPENDENT_AMBULATORY_CARE_PROVIDER_SITE_OTHER): Payer: Medicare Other

## 2020-10-08 ENCOUNTER — Encounter: Payer: Self-pay | Admitting: Podiatry

## 2020-10-08 ENCOUNTER — Ambulatory Visit: Payer: Medicare Other | Admitting: Podiatry

## 2020-10-08 DIAGNOSIS — M2042 Other hammer toe(s) (acquired), left foot: Secondary | ICD-10-CM

## 2020-10-08 DIAGNOSIS — M778 Other enthesopathies, not elsewhere classified: Secondary | ICD-10-CM | POA: Diagnosis not present

## 2020-10-08 MED ORDER — TRIAMCINOLONE ACETONIDE 10 MG/ML IJ SUSP
10.0000 mg | Freq: Once | INTRAMUSCULAR | Status: AC
  Administered 2020-10-08: 10 mg

## 2020-10-08 NOTE — Progress Notes (Signed)
Subjective:   Patient ID: Christine Newman, female   DOB: 79 y.o.   MRN: [id number]   HPI Patient states she has had some pain on top of her left foot and also does have hammertoe deformity which can be bothersome.  Does not smoke likes to be active is much as possible   Review of Systems  All other systems reviewed and are negative.       Objective:  Physical Exam Vitals and nursing note reviewed.  Constitutional:      Appearance: She is well-developed and well-nourished.  Cardiovascular:     Pulses: Intact distal pulses.  Pulmonary:     Effort: Pulmonary effort is normal.  Musculoskeletal:        General: Normal range of motion.  Skin:    General: Skin is warm.  Neurological:     Mental Status: She is alert.     Neurovascular status found to be intact muscle strength found to be adequate range of motion adequate with patient found to have inflammation pain of the midfoot left that is painful when pressed and makes wearing shoe gear difficult.  Patient has good digital perfusion well oriented x3 with elevation second digit left over right     Assessment:  Extensor tendinitis left with hammertoe deformity also noted     Plan:  HP reviewed condition x-ray and went ahead did sterile prep injected the extensor complex 3 mg Kenalog 5 mg Xylocaine and advised this patient on topical treatment.  Discussed possible MRI or CT scan if symptoms come back quickly and may ultimately require fusion procedure.  Do not recommend treatment for hammertoe  X-rays indicate midfoot arthritis left with spurring with elevated second digit left

## 2020-11-11 IMAGING — DX ABDOMEN - 1 VIEW
1 series · 1 of 1 positions shown · non-contrast
Comparison: CT 04/05/2018

CLINICAL DATA: Renal calculus

EXAM:
ABDOMEN - 1 VIEW

[abdomen kub]
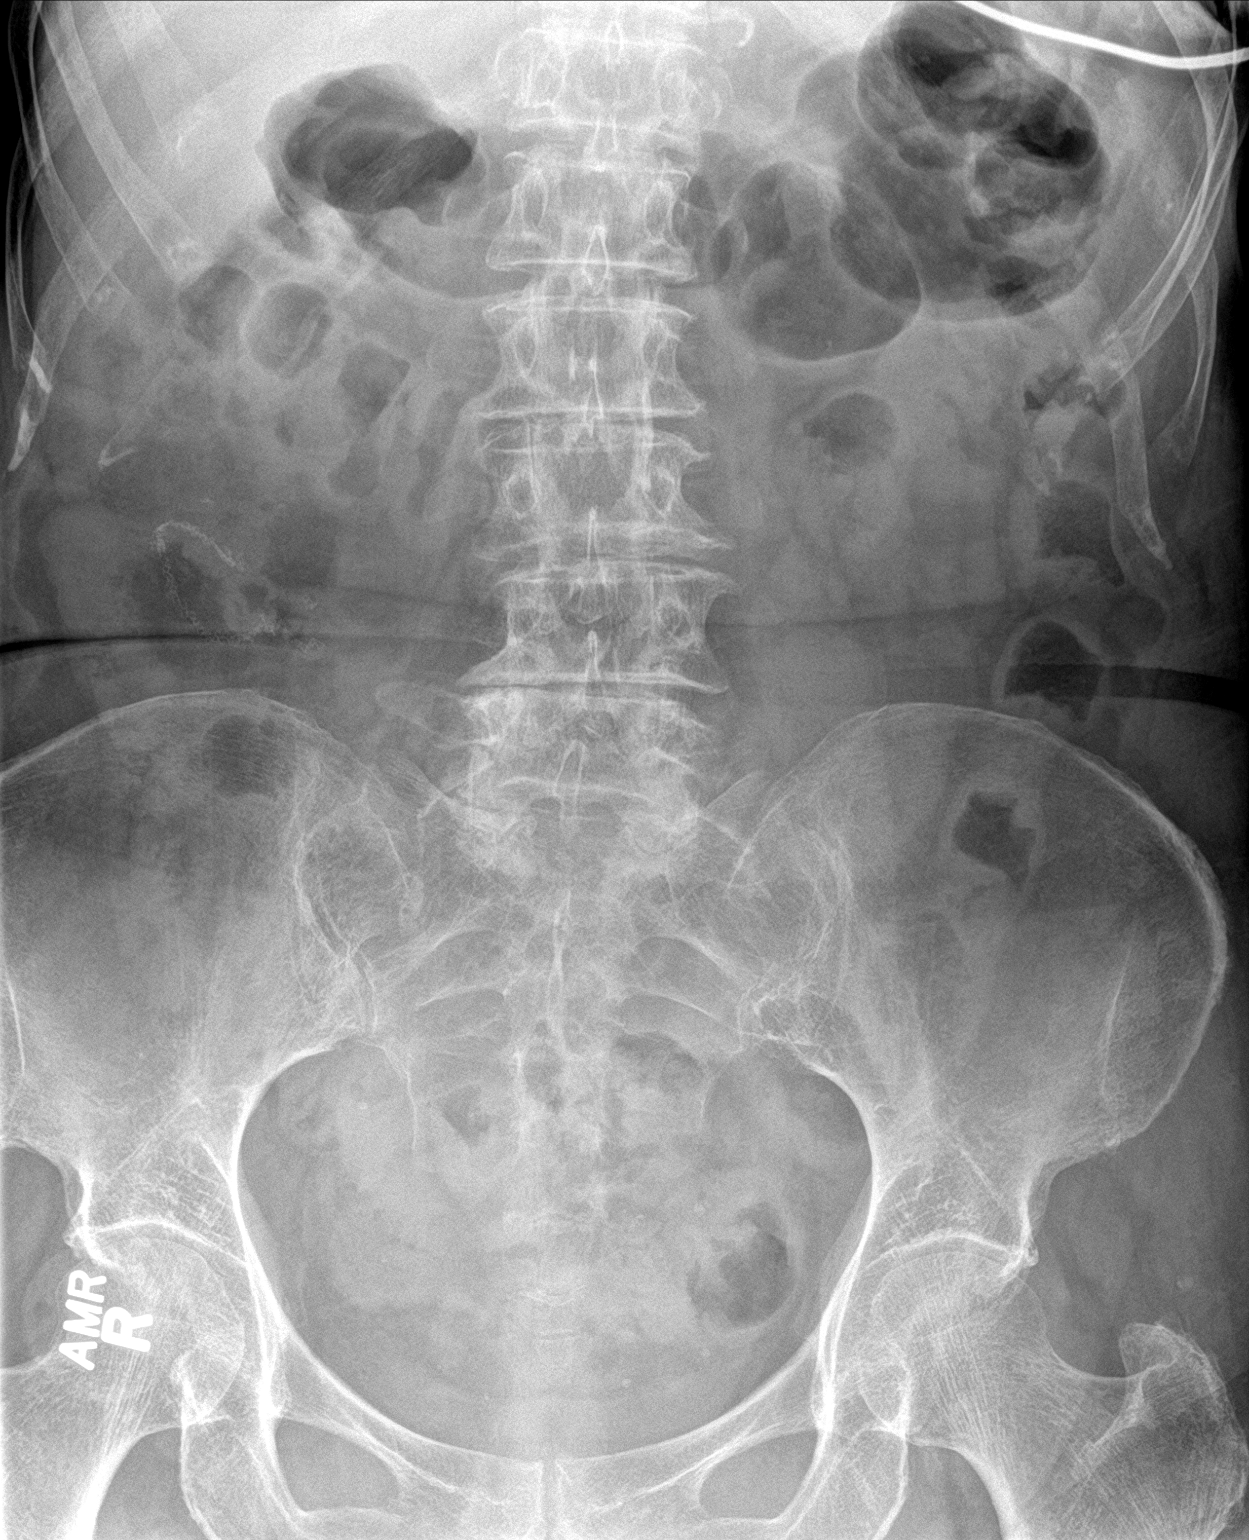

[1 of 1 positions shown; findings below may reference images not displayed]

FINDINGS: Previously seen right renal stones not well visualized, likely
obscured by overlying bowel gas/stool. No visible suspicious
calcification. Nonobstructive bowel gas pattern. No organomegaly or
free air.
IMPRESSION: Nonvisualization of the previously seen right renal stones.

## 2021-01-16 ENCOUNTER — Telehealth: Payer: Self-pay | Admitting: Family Medicine

## 2021-01-16 NOTE — Telephone Encounter (Signed)
Left message for patient to call back and schedule Medicare Annual Wellness Visit (AWV) either virtually or in office. No detailed message left   AWV-I PER PALMETTO 10/11/09  please schedule at anytime

## 2021-02-09 ENCOUNTER — Ambulatory Visit (HOSPITAL_COMMUNITY)
Admission: RE | Admit: 2021-02-09 | Discharge: 2021-02-09 | Disposition: A | Discharge status: Home / Self Care | Payer: Medicare Other | Source: Ambulatory Visit | Attending: Urology | Admitting: Urology

## 2021-02-09 ENCOUNTER — Other Ambulatory Visit: Payer: Self-pay

## 2021-02-09 DIAGNOSIS — N200 Calculus of kidney: Secondary | ICD-10-CM | POA: Diagnosis not present

## 2021-02-11 ENCOUNTER — Encounter: Payer: Self-pay | Admitting: Urology

## 2021-02-11 ENCOUNTER — Ambulatory Visit (INDEPENDENT_AMBULATORY_CARE_PROVIDER_SITE_OTHER): Payer: Medicare Other | Admitting: Urology

## 2021-02-11 ENCOUNTER — Other Ambulatory Visit: Payer: Self-pay

## 2021-02-11 VITALS — BP 159/79 | HR 64 | Temp 98.3°F | Ht 66.0 in | Wt 140.0 lb

## 2021-02-11 DIAGNOSIS — N200 Calculus of kidney: Secondary | ICD-10-CM | POA: Diagnosis not present

## 2021-02-11 LAB — MICROSCOPIC EXAMINATION: Renal Epithel, UA: NONE SEEN /hpf

## 2021-02-11 LAB — URINALYSIS, ROUTINE W REFLEX MICROSCOPIC
Bilirubin, UA: NEGATIVE
Glucose, UA: NEGATIVE
Nitrite, UA: NEGATIVE
RBC, UA: NEGATIVE
Specific Gravity, UA: 1.02 (ref 1.005–1.030)
Urobilinogen, Ur: 0.2 mg/dL (ref 0.2–1.0)
pH, UA: 5.5 (ref 5.0–7.5)

## 2021-02-11 NOTE — Patient Instructions (Signed)

## 2021-02-11 NOTE — Progress Notes (Signed)
Urological Symptom Review  Patient is experiencing the following symptoms: Get up at night to urinate Kidney stones  Review of Systems  Gastrointestinal (upper)  : Negative for upper GI symptoms  Gastrointestinal (lower) : Negative for lower GI symptoms  Constitutional : Negative for symptoms  Skin: Negative for skin symptoms  Eyes: Negative for eye symptoms  Ear/Nose/Throat : Negative for Ear/Nose/Throat symptoms  Hematologic/Lymphatic: Easy bruising  Cardiovascular : Negative for cardiovascular symptoms  Respiratory : Negative for respiratory symptoms  Endocrine: Negative for endocrine symptoms  Musculoskeletal: Joint pain  Neurological: Negative for neurological symptoms  Psychologic: Negative for psychiatric symptoms

## 2021-02-11 NOTE — Progress Notes (Signed)
02/11/2021 10:14 AM   Christine Newman January 26, 1941 [id number]  Referring provider: Mikey Kirschner, MD No address on file  followup nephrolithiasis  HPI: [name] is a 80yo here for followup for nephrolithiasis. No stone events since last visit. She drinks 64oz of water daily. KUB shows 2 small left renal calculi. No flank pain. No worsening LUTS   PMH: Past Medical History:  Diagnosis Date  . Anal polyp 2005   condyloma acuminatum  . Anemia   . CKD (chronic kidney disease)    Cr 1.9 -09/2014  . Colonic mass   . Diverticulitis    with abscess  . History of kidney stones   . Hypertension     Surgical History: Past Surgical History:  Procedure Laterality Date  . anal polypectomy  01/02/2004   Dr.Rosenbower- anal polyp removed. bx= condyloma acuminatum  . CATARACT EXTRACTION W/PHACO Left 08/10/2018   Procedure: CATARACT EXTRACTION PHACO AND INTRAOCULAR LENS PLACEMENT LEFT EYE;  Surgeon: Christine Branch, MD;  Location: [facility];  Service: Ophthalmology;  Laterality: Left;  left  . CATARACT EXTRACTION W/PHACO Right 09/04/2018   Procedure: CATARACT EXTRACTION PHACO AND INTRAOCULAR LENS PLACEMENT (IOC);  Surgeon: Christine Branch, MD;  Location: [facility];  Service: Ophthalmology;  Laterality: Right;  CDE: 11.16  . COLONOSCOPY  04/08/2003   Dr.Mann- small nonbleeding internal and external hemorrhoids small polypoid mass at anal verge, normal appearing L colon, transverse colon, R colon including cecum. stenotic ileocecal valve. bx of maa= benign rectal type mucosa with features of mucosal prolapse syndrome  . COLONOSCOPY N/A 04/02/2015   Dr.Rourk- colonic diverticulosis, no evidence of colonic neoplasm. abnormal ileocecal valve with ulceration bx= ulcerated/eroded benign ileocecal valve mucosa  . COLONOSCOPY N/A 06/14/2018   scattered diverticula in sigmoid and descending colon. Distal 10 cm of neoterminal ileum appears normal. Appears to have surgical remission  . FLEXIBLE  SIGMOIDOSCOPY  11/06/2003   Dr.Mann- small nodular mass at anal verge, bx done. early L side diverticula. bx= benign rectal mucosa with features of mucosal prolapse syndrome. no adenomatous changes or evidence of malignancy identified.   . IR NEPHROSTOMY PLACEMENT LEFT  01/05/2018  . KIDNEY STONE SURGERY    . LAPAROSCOPY N/A 11/02/2017   Procedure: LAPAROSCOPIC ILEOCECECTOMY;  Surgeon: Christine Ruff, MD;  Location: [facility];  Service: General;  Laterality: N/A;  . NEPHROLITHOTOMY Left 02/13/2018   Procedure: NEPHROLITHOTOMY PERCUTANEOUS;  Surgeon: Christine Gustin, MD;  Location: [facility];  Service: Urology;  Laterality: Left;    Home Medications:  Allergies as of 02/11/2021      Reactions   No Known Allergies       Medication List       Accurate as of Feb 11, 2021 10:14 AM. If you have any questions, ask your nurse or doctor.        STOP taking these medications   amoxicillin-clavulanate 500-125 MG tablet Commonly known as: AUGMENTIN Stopped by: Christine Bang, MD   baclofen 10 MG tablet Commonly known as: LIORESAL Stopped by: Christine Bang, MD   predniSONE 20 MG tablet Commonly known as: DELTASONE Stopped by: Christine Bang, MD   verapamil 180 MG CR tablet Commonly known as: CALAN-SR Stopped by: Christine Bang, MD     TAKE these medications   amLODipine 5 MG tablet Commonly known as: NORVASC Take 5 mg by mouth at bedtime.   amLODipine 10 MG tablet Commonly known as: NORVASC Take 1 tablet by mouth at bedtime.   cholecalciferol 1000 units tablet Commonly known as:  VITAMIN D Take 1,000 Units by mouth daily.   folic acid 174 MCG tablet Commonly known as: FOLVITE Take 400 mcg by mouth daily.   metoprolol succinate 25 MG 24 hr tablet Commonly known as: TOPROL-XL Take 1 tablet (25 mg total) by mouth daily.   multivitamin with minerals tablet Take 1 tablet by mouth daily.   Potassium Citrate 15 MEQ (1620 MG) Tbcr Commonly known as: Urocit-K 15 Take 1  tablet by mouth 2 (two) times daily.   vitamin B-12 1000 MCG tablet Commonly known as: CYANOCOBALAMIN Take 1,000 mcg by mouth daily.       Allergies:  Allergies  Allergen Reactions  . No Known Allergies     Family History: Family History  Problem Relation Age of Onset  . Hypertension Mother   . Heart disease Father   . Cancer Sister        breast  . Colon cancer Neg Hx   . Colon polyps Neg Hx     Social History:  reports that she quit smoking about 59 years ago. Her smoking use included cigarettes. She has a 1.25 pack-year smoking history. She has never used smokeless tobacco. She reports that she does not drink alcohol and does not use drugs.  ROS: All other review of systems were reviewed and are negative except what is noted above in HPI  Physical Exam: BP (!) 159/79   Pulse 64   Temp 98.3 F (36.8 C)   Ht 5' 6"  (1.676 m)   Wt 140 lb (63.5 kg)   LMP  (LMP Unknown)   BMI 22.60 kg/m   Constitutional:  Alert and oriented, No acute distress. HEENT: Star AT, moist mucus membranes.  Trachea midline, no masses. Cardiovascular: No clubbing, cyanosis, or edema. Respiratory: Normal respiratory effort, no increased work of breathing. GI: Abdomen is soft, nontender, nondistended, no abdominal masses GU: No CVA tenderness.  Lymph: No cervical or inguinal lymphadenopathy. Skin: No rashes, bruises or suspicious lesions. Neurologic: Grossly intact, no focal deficits, moving all 4 extremities. Psychiatric: Normal mood and affect.  Laboratory Data: Lab Results  Component Value Date   WBC 4.7 08/20/2020   HGB 13.8 08/20/2020   HCT 42.9 08/20/2020   MCV 95 08/20/2020   PLT 273 08/20/2020    Lab Results  Component Value Date   CREATININE 1.51 (H) 06/09/2018    No results found for: PSA  No results found for: TESTOSTERONE  No results found for: HGBA1C  Urinalysis    Component Value Date/Time   COLORURINE YELLOW 01/05/2018 1404   APPEARANCEUR HAZY (A) 01/05/2018  1404   LABSPEC 1.018 01/05/2018 1404   PHURINE 5.0 01/05/2018 1404   GLUCOSEU NEGATIVE 01/05/2018 1404   HGBUR MODERATE (A) 01/05/2018 1404   BILIRUBINUR neg 02/11/2020 1530   KETONESUR NEGATIVE 01/05/2018 1404   PROTEINUR Positive (A) 02/11/2020 1530   PROTEINUR NEGATIVE 01/05/2018 1404   UROBILINOGEN 0.2 02/11/2020 1530   UROBILINOGEN 0.2 01/13/2015 1908   NITRITE ne 02/11/2020 1530   NITRITE NEGATIVE 01/05/2018 1404   LEUKOCYTESUR Moderate (2+) (A) 02/11/2020 1530    Lab Results  Component Value Date   BACTERIA RARE (A) 01/05/2018    Pertinent Imaging: KUB 02/09/2021: Images reviewed and discussed with the patient Results for orders placed during the hospital encounter of 02/09/21  Abdomen 1 view (KUB)  Narrative CLINICAL DATA:  Fall kidney stone.  EXAM: ABDOMEN - 1 VIEW  COMPARISON:  01/08/2020.  Ultrasound 07/03/2018.  CT 04/05/2018.  FINDINGS: Surgical sutures noted over  the right abdomen. No bowel distention. No definite right renal stone identified. Two tiny left renal stones cannot be excluded. Pelvic calcifications again noted in stable position, most likely phleboliths. Degenerative change lumbar spine.  IMPRESSION: 1.  No definite right renal stone identified.  2.  Two tiny left renal stones cannot be excluded.   Electronically Signed By: Marcello Moores  Register On: 02/10/2021 10:29  No results found for this or any previous visit.  No results found for this or any previous visit.  No results found for this or any previous visit.  Results for orders placed during the hospital encounter of 07/03/18  US RENAL  Narrative CLINICAL DATA:  80 year old female with right kidney stone. Six-month follow-up.  EXAM: RENAL / URINARY TRACT ULTRASOUND COMPLETE  COMPARISON:  04/19/2018 renal sonogram. 04/05/2018 CT. Plain film of the abdomen same date dictated separately.  FINDINGS: Right Kidney:  Length: 9.8 cm. Slight increased renal parenchyma  echogenicity. No hydronephrosis. 6 mm nonobstructing stone mid aspect. No right renal mass identified.  Left Kidney:  Length: 7.8 cm. Atrophic and difficult to adequately assess. No obvious mass or hydronephrosis.  Bladder:  Not evaluated as empty.  IMPRESSION: 1. 6 mm nonobstructing right renal calculi. No right-sided hydronephrosis. Mild increased renal parenchyma echogenicity of the right kidney may represent changes of mild medical renal disease. 2. Atrophic left kidney difficult to visualize. No obvious left-sided hydronephrosis or mass.   Electronically Signed By: Genia Del M.D. On: 07/03/2018 16:35  No results found for this or any previous visit.  No results found for this or any previous visit.  Results for orders placed during the hospital encounter of 02/13/18  CT RENAL STONE STUDY  Narrative CLINICAL DATA:  Left-sided pain. Reported left percutaneous nephrolithotomy 1 day prior.  EXAM: CT ABDOMEN AND PELVIS WITHOUT CONTRAST  TECHNIQUE: Multidetector CT imaging of the abdomen and pelvis was performed following the standard protocol without IV contrast.  COMPARISON:  02/08/2018 CT abdomen/pelvis.  FINDINGS: Lower chest: Subsegmental dependent bibasilar atelectasis. Subcentimeter calcified medial left lower lobe granuloma. Coronary atherosclerosis.  Hepatobiliary: Normal liver size. Simple 1.0 cm posterior right liver lobe cyst. Subcentimeter hypodense liver lesions near the gallbladder fossa (series 2/image 21), too small to characterize, stable, probably benign. No new liver lesions. Mildly distended gallbladder. No radiopaque cholelithiasis. No gallbladder wall thickening. No pericholecystic fluid. No biliary ductal dilatation.  Pancreas: Normal, with no mass or duct dilation.  Spleen: Normal size. No mass.  Adrenals/Urinary Tract: Normal adrenals. Asymmetric severe left renal parenchymal atrophy. Large bore left percutaneous  nephrostomy tube terminates in the left renal pelvis. Well-positioned left nephroureteral stent with proximal pigtail portion in the left renal pelvis and distal pigtail portion in the bladder. Expected gas scattered in the left renal collecting system. A few mildly dilated calices in the left renal collecting system. Left renal pelvis collapse. Nonspecific haziness of the central left renal sinus fat, presumably inflammatory or due to postprocedure change. No residual stone fragments in the left renal collecting system or left ureter. Mild ill-defined fluid and haziness in the left perinephric retroperitoneum. No contour deforming left renal mass.  Nonobstructing 4 mm interpolar and 2 mm lower right renal stones. Small simple parapelvic right renal cysts. No right hydronephrosis. Normal caliber right ureter, with no right ureteral stones. No contour deforming right renal mass.  Collapsed bladder. No bladder stones. Expected gas in the nondependent bladder from instrumentation.  Stomach/Bowel: Normal non-distended stomach. Stable postsurgical changes from ileocecal resection with intact appearing neo ileocolic  anastomosis in the right abdomen. No small bowel wall thickening. A few mildly dilated small bowel loops in the right abdomen up to the 4.3 cm diameter without focal small bowel caliber transition. Remnant large bowel is normal caliber with no wall thickening or significant pericolonic fat stranding.  Vascular/Lymphatic: Atherosclerotic nonaneurysmal abdominal aorta. No pathologically enlarged lymph nodes in the abdomen or pelvis.  Reproductive: Grossly normal uterus.  No adnexal mass.  Other: No pneumoperitoneum, ascites or focal fluid collection.  Musculoskeletal: No aggressive appearing focal osseous lesions. Moderate lumbar spondylosis.  IMPRESSION: 1. Expected postprocedural change status post left percutaneous nephrolithotomy. No residual stone fragments in the  left renal collecting system or left ureter. Well-positioned left percutaneous nephrostomy tube and left nephroureteral stent. A few dilated calices in the left renal collecting system with nondilated left renal pelvis. No focal fluid collections. 2. Nonobstructing right nephrolithiasis. 3. Mildly dilated right abdominal small bowel loops, favor mild post procedural ileus. 4.  Aortic Atherosclerosis (ICD10-I70.0).   Electronically Signed By: Ilona Sorrel M.D. On: 02/14/2018 13:16   Assessment & Plan:    1. Nephrolithiasis -KUB in 6 months - Urinalysis, Routine w reflex microscopic   No follow-ups on file.  Christine Bang, MD  [facility] [place]

## 2021-02-12 ENCOUNTER — Other Ambulatory Visit: Payer: Self-pay | Admitting: Family Medicine

## 2021-02-16 ENCOUNTER — Other Ambulatory Visit: Payer: Self-pay

## 2021-02-16 ENCOUNTER — Ambulatory Visit (INDEPENDENT_AMBULATORY_CARE_PROVIDER_SITE_OTHER): Payer: Medicare Other | Admitting: Podiatry

## 2021-02-16 DIAGNOSIS — M778 Other enthesopathies, not elsewhere classified: Secondary | ICD-10-CM

## 2021-02-16 MED ORDER — TRIAMCINOLONE ACETONIDE 10 MG/ML IJ SUSP
10.0000 mg | Freq: Once | INTRAMUSCULAR | Status: AC
  Administered 2021-02-16: 10 mg

## 2021-02-16 NOTE — Telephone Encounter (Signed)
No results found for: HGBA1C  Lab Results  Component Value Date   CREATININE 1.51 (H) 06/09/2018     Lab Results  Component Value Date   CHOL 180 08/20/2020   HDL 60 08/20/2020   LDLCALC 97 08/20/2020   TRIG 133 08/20/2020   CHOLHDL 3.0 08/20/2020     BP Readings from Last 3 Encounters:  02/11/21 (!) 159/79  09/23/20 (!) 162/84  08/20/20 140/88

## 2021-02-17 ENCOUNTER — Ambulatory Visit (INDEPENDENT_AMBULATORY_CARE_PROVIDER_SITE_OTHER): Payer: Medicare Other | Admitting: Family Medicine

## 2021-02-17 VITALS — BP 136/84 | HR 66 | Temp 97.3°F | Ht 66.0 in | Wt 146.0 lb

## 2021-02-17 DIAGNOSIS — I10 Essential (primary) hypertension: Secondary | ICD-10-CM | POA: Diagnosis not present

## 2021-02-17 DIAGNOSIS — N183 Chronic kidney disease, stage 3 unspecified: Secondary | ICD-10-CM | POA: Diagnosis not present

## 2021-02-17 MED ORDER — AMLODIPINE BESYLATE 10 MG PO TABS: 1.0000 | ORAL_TABLET | Freq: Every day | ORAL | 1 refills | Status: DC

## 2021-02-17 MED ORDER — METOPROLOL SUCCINATE ER 25 MG PO TB24: ORAL_TABLET | ORAL | 1 refills | Status: DC

## 2021-02-17 NOTE — Progress Notes (Signed)
Patient ID: Christine Newman, female    DOB: 1941-08-12, 80 y.o.   MRN: [id number]   Chief Complaint  Patient presents with  . Hypertension    Follow up   Subjective:    HPI HTN Pt compliant with BP meds.  No SEs Denies chest pain, sob, LE swelling, or blurry vision. bp checking home- seeing 130-140's/ 80s.   Taking metoprolol in am and amlodipine at night.  History of kidney stones and has low K and is getting supplement for that.  Seeing nephrology with Dr. Moshe Newman. Last Cr 1.55. Was on verapamil in past, now on amlodipine and metoprolol.   Seeing podiatry on left, cortisone yesterday in left foot.  Injection yesterday. With walking hurting after walking a mile. Seeing Dr. Paulla Newman, [facility].  Medical History Christine Newman has a past medical history of Anal polyp (2005), Anemia, CKD (chronic kidney disease), Colonic mass, Diverticulitis, History of kidney stones, and Hypertension.   Outpatient Encounter Medications as of 02/17/2021  Medication Sig  . amLODipine (NORVASC) 10 MG tablet Take 1 tablet (10 mg total) by mouth at bedtime.  . cholecalciferol (VITAMIN D) 1000 units tablet Take 1,000 Units by mouth daily.  . folic acid (FOLVITE) 638 MCG tablet Take 400 mcg by mouth daily.  . metoprolol succinate (TOPROL-XL) 25 MG 24 hr tablet TAKE (1) TABLET BY MOUTH ONCE DAILY.  . Multiple Vitamins-Minerals (MULTIVITAMIN WITH MINERALS) tablet Take 1 tablet by mouth daily.  . Potassium Citrate (UROCIT-K 15) 15 MEQ (1620 MG) TBCR Take 1 tablet by mouth 2 (two) times daily.  . vitamin B-12 (CYANOCOBALAMIN) 1000 MCG tablet Take 1,000 mcg by mouth daily.  . [DISCONTINUED] amLODipine (NORVASC) 10 MG tablet Take 1 tablet by mouth at bedtime.  . [DISCONTINUED] amLODipine (NORVASC) 5 MG tablet Take 5 mg by mouth at bedtime.  . [DISCONTINUED] metoprolol succinate (TOPROL-XL) 25 MG 24 hr tablet TAKE (1) TABLET BY MOUTH ONCE DAILY.   No facility-administered encounter medications on  file as of 02/17/2021.     Review of Systems  Constitutional: Negative for chills and fever.  HENT: Negative for congestion, rhinorrhea and sore throat.   Respiratory: Negative for cough, shortness of breath and wheezing.   Cardiovascular: Negative for chest pain and leg swelling.  Gastrointestinal: Negative for abdominal pain, diarrhea, nausea and vomiting.  Genitourinary: Negative for dysuria and frequency.  Musculoskeletal: Negative for arthralgias and back pain.  Skin: Negative for rash.  Neurological: Negative for dizziness, weakness and headaches.     Vitals BP 136/84   Pulse 66   Temp (!) 97.3 F (36.3 C) (Oral)   Ht _0  (1.676 m)   Wt 146 lb (66.2 kg)   LMP  (LMP Unknown)   SpO2 100%   BMI 23.57 kg/m   Objective:   Physical Exam Vitals and nursing note reviewed.  Constitutional:      Appearance: Normal appearance.  HENT:     Head: Normocephalic and atraumatic.     Nose: Nose normal.     Mouth/Throat:     Mouth: Mucous membranes are moist.     Pharynx: Oropharynx is clear.  Eyes:     Extraocular Movements: Extraocular movements intact.     Conjunctiva/sclera: Conjunctivae normal.     Pupils: Pupils are equal, round, and reactive to light.  Cardiovascular:     Rate and Rhythm: Normal rate and regular rhythm.     Pulses: Normal pulses.     Heart sounds: Normal heart sounds.  Pulmonary:  Effort: Pulmonary effort is normal.     Breath sounds: Normal breath sounds. No wheezing, rhonchi or rales.  Musculoskeletal:        General: Normal range of motion.     Right lower leg: No edema.     Left lower leg: No edema.  Skin:    General: Skin is warm and dry.     Findings: No lesion or rash.  Neurological:     General: No focal deficit present.     Mental Status: She is alert and oriented to person, place, and time.  Psychiatric:        Mood and Affect: Mood normal.        Behavior: Behavior normal.      Assessment and Plan   1. Essential  hypertension, benign - CBC - CMP14+EGFR - Lipid panel  2. Stage 3 chronic kidney disease, unspecified whether stage 3a or 3b CKD (HCC) - CBC - CMP14+EGFR   htn- stable. Cont meds.  CKD-3- stable. Cont to control bp. Recheck on next visit. Cont f/u with nephrology.  Return in about 6 months (around 08/20/2021) for f/u htn.

## 2021-02-17 NOTE — Progress Notes (Signed)
Subjective:   Patient ID: Christine Newman, female   DOB: 80 y.o.   MRN: [id number]   HPI Patient presents stating she is developed a lot of tenderness on top of her left foot and swelling and does not remember injury   ROS      Objective:  Physical Exam  Neurovascular status intact with exquisite discomfort dorsal tendon complex left with inflammation of the tendon complex     Assessment:  Extensor tendinitis dorsal left with probable midfoot arthritis     Plan:  H&P done x-rays reviewed sterile prep injected the extensor tendon complex left 3 mg Kenalog 5 mg Xylocaine advised on heat ice therapy topical medicines and soft shoes that are not tight.  Reappoint as needed  X-rays were negative for advanced arthritis with probability for moderate midfoot arthritis present no indication stress fracture

## 2021-03-16 ENCOUNTER — Telehealth: Payer: Self-pay

## 2021-03-16 NOTE — Telephone Encounter (Signed)
90 day with one refill sent to [facility] on 02/17/21. Left message to return call

## 2021-03-16 NOTE — Telephone Encounter (Signed)
Pt.notified

## 2021-03-16 NOTE — Telephone Encounter (Signed)
Pt was seen recently and was not wrote a refill for medication and she needs refill on metoprolol succinate (TOPROL-XL) 25 MG 24 hr tablet [place], [place] [facility] [place]   Pt call [phone]

## 2021-04-07 ENCOUNTER — Telehealth: Payer: Self-pay

## 2021-04-07 NOTE — Telephone Encounter (Signed)
I got documentation over from [place] needing a reason for this pt to have a Bone Density Scan. Document in you office on the cart.

## 2021-04-07 NOTE — Telephone Encounter (Signed)
Paperwork signed by Dr. Gala Romney and faxed back to [place]. Dx: Hx of Crohn's Disease.

## 2021-04-17 DIAGNOSIS — Z1231 Encounter for screening mammogram for malignant neoplasm of breast: Secondary | ICD-10-CM | POA: Diagnosis not present

## 2021-04-17 DIAGNOSIS — Z780 Asymptomatic menopausal state: Secondary | ICD-10-CM | POA: Diagnosis not present

## 2021-04-17 DIAGNOSIS — M8589 Other specified disorders of bone density and structure, multiple sites: Secondary | ICD-10-CM | POA: Diagnosis not present

## 2021-04-30 DIAGNOSIS — M1712 Unilateral primary osteoarthritis, left knee: Secondary | ICD-10-CM | POA: Diagnosis not present

## 2021-05-11 ENCOUNTER — Encounter: Payer: Self-pay | Admitting: Family Medicine

## 2021-05-11 ENCOUNTER — Telehealth: Payer: Self-pay | Admitting: Family Medicine

## 2021-05-11 DIAGNOSIS — M8580 Other specified disorders of bone density and structure, unspecified site: Secondary | ICD-10-CM | POA: Insufficient documentation

## 2021-05-11 NOTE — Telephone Encounter (Signed)
From Dexa scan from Dr. Jenny Reichmann order, showing osteopenia.  Early sign of osteoporosis - bone thinning and increases risk for bone fractures.     Rec-Take vit d and calcium.   1200 mg of elemental calcium daily, and 800 international units of vitamin D daily are advised.   You can get this otc.   F/u in 2 yrs for repeat dexa scan.     Dr Lovena Le

## 2021-05-11 NOTE — Telephone Encounter (Signed)
Left message to return call 

## 2021-05-11 NOTE — Telephone Encounter (Signed)
Pt returned call and verbalized understanding

## 2021-06-18 DIAGNOSIS — H903 Sensorineural hearing loss, bilateral: Secondary | ICD-10-CM | POA: Diagnosis not present

## 2021-06-18 DIAGNOSIS — H6121 Impacted cerumen, right ear: Secondary | ICD-10-CM | POA: Diagnosis not present

## 2021-07-31 ENCOUNTER — Other Ambulatory Visit: Payer: Self-pay

## 2021-07-31 ENCOUNTER — Ambulatory Visit (INDEPENDENT_AMBULATORY_CARE_PROVIDER_SITE_OTHER): Payer: Medicare Other | Admitting: Family Medicine

## 2021-07-31 VITALS — BP 130/78 | HR 43 | Temp 97.0°F | Ht 66.0 in | Wt 146.6 lb

## 2021-07-31 DIAGNOSIS — M1990 Unspecified osteoarthritis, unspecified site: Secondary | ICD-10-CM | POA: Insufficient documentation

## 2021-07-31 DIAGNOSIS — R42 Dizziness and giddiness: Secondary | ICD-10-CM

## 2021-07-31 DIAGNOSIS — E7800 Pure hypercholesterolemia, unspecified: Secondary | ICD-10-CM | POA: Diagnosis not present

## 2021-07-31 DIAGNOSIS — R001 Bradycardia, unspecified: Secondary | ICD-10-CM | POA: Insufficient documentation

## 2021-07-31 DIAGNOSIS — Z23 Encounter for immunization: Secondary | ICD-10-CM | POA: Diagnosis not present

## 2021-07-31 DIAGNOSIS — Z8719 Personal history of other diseases of the digestive system: Secondary | ICD-10-CM | POA: Insufficient documentation

## 2021-07-31 DIAGNOSIS — Z87442 Personal history of urinary calculi: Secondary | ICD-10-CM | POA: Insufficient documentation

## 2021-07-31 DIAGNOSIS — N183 Chronic kidney disease, stage 3 unspecified: Secondary | ICD-10-CM | POA: Diagnosis not present

## 2021-07-31 DIAGNOSIS — I10 Essential (primary) hypertension: Secondary | ICD-10-CM

## 2021-07-31 MED ORDER — AMLODIPINE BESYLATE 10 MG PO TABS: 10.0000 mg | ORAL_TABLET | Freq: Every day | ORAL | 1 refills | Status: DC

## 2021-07-31 NOTE — Patient Instructions (Signed)
Pneumonia shot and Flu shot given today.  Labs when you leave (at [place]).   Stop the metoprolol.  Recheck next week.  Take care  Dr. Lacinda Axon

## 2021-07-31 NOTE — Assessment & Plan Note (Addendum)
Unsure of current status.  Labs today.

## 2021-07-31 NOTE — Progress Notes (Signed)
Subjective:  Patient ID: Christine Newman, female    DOB: [birth year]  Age: 80 y.o. MRN: [id number]  CC: Chief Complaint  Patient presents with   Hypertension    HPI:  80 year old female presents for follow up.   Hypertension Has been well controlled on  Amlodipine and Metoprolol. Has had recent dizziness for the last few days.  No dizziness currently. Denies chest pain, SOB. No fatigue.  HLD Needs lipid panel. Last lipid panel was in 2021; LDL at that time was 97.  CKD No recent labs. Follows with Nephrology - Dr. Clover Mealy.  Health maintenance Flu shot and PCV 23 today.  Patient Active Problem List   Diagnosis Date Noted   History of kidney stones 07/31/2021   History of diverticulitis 07/31/2021   Osteoarthritis 07/31/2021   Bradycardia 07/31/2021   Pure hypercholesterolemia 07/31/2021   Osteopenia 05/11/2021   Crohn's disease in remission ([place]) 09/15/2018   Generalized anxiety disorder 09/27/2015   CKD (chronic kidney disease) stage 3, GFR 30-59 ml/min ([place]) 01/14/2015   Essential hypertension, benign 03/11/2013    Social Hx   Social History   Socioeconomic History   Marital status: Widowed    Spouse name: Not on file   Number of children: Not on file   Years of education: Not on file   Highest education level: Not on file  Occupational History   Not on file  Tobacco Use   Smoking status: Former    Packs/day: 0.25    Years: 5.00    Pack years: 1.25    Types: Cigarettes    Quit date: 01/18/1962    Years since quitting: 59.5   Smokeless tobacco: Never   Tobacco comments:    1 pack cigarettes per week ; quit in her early 74s   Vaping Use   Vaping Use: Never used  Substance and Sexual Activity   Alcohol use: No   Drug use: Never   Sexual activity: Not Currently    Birth control/protection: Post-menopausal  Other Topics Concern   Not on file  Social History Narrative   Not on file   Social Determinants of Health   Financial Resource  Strain: Not on file  Food Insecurity: Not on file  Transportation Needs: Not on file  Physical Activity: Not on file  Stress: Not on file  Social Connections: Not on file    Review of Systems Per HPI  Objective:  BP 130/78   Pulse (!) 43   Temp (!) 97 F (36.1 C)   Ht 5' 6"  (1.676 m)   Wt 146 lb 9.6 oz (66.5 kg)   LMP  (LMP Unknown)   SpO2 97%   BMI 23.66 kg/m   BP/Weight 07/31/2021 3/71/6967 05/19/3809  Systolic BP 175 102 585  Diastolic BP 78 84 79  Wt. (Lbs) 146.6 146 140  BMI 23.66 23.57 22.6    Physical Exam Vitals and nursing note reviewed.  Constitutional:      General: She is not in acute distress.    Appearance: Normal appearance. She is not ill-appearing.  HENT:     Head: Normocephalic and atraumatic.     Mouth/Throat:     Pharynx: Oropharynx is clear.  Eyes:     General:        Right eye: No discharge.        Left eye: No discharge.     Conjunctiva/sclera: Conjunctivae normal.  Cardiovascular:     Rate and Rhythm: Regular rhythm. Bradycardia present.  Heart sounds: No murmur heard. Pulmonary:     Effort: Pulmonary effort is normal.     Breath sounds: Normal breath sounds. No wheezing, rhonchi or rales.  Abdominal:     General: There is no distension.     Palpations: Abdomen is soft.     Tenderness: There is no abdominal tenderness.  Neurological:     Mental Status: She is alert.  Psychiatric:        Mood and Affect: Mood normal.        Behavior: Behavior normal.    Lab Results  Component Value Date   WBC 4.7 08/20/2020   HGB 13.8 08/20/2020   HCT 42.9 08/20/2020   PLT 273 08/20/2020   GLUCOSE 84 06/09/2018   CHOL 180 08/20/2020   TRIG 133 08/20/2020   HDL 60 08/20/2020   LDLCALC 97 08/20/2020   ALT 13 (L) 01/05/2018   AST 20 01/05/2018   NA 142 06/09/2018   K 4.3 06/09/2018   CL 105 06/09/2018   CREATININE 1.51 (H) 06/09/2018   BUN 19 06/09/2018   CO2 29 06/09/2018   INR 1.06 01/05/2018     Assessment & Plan:   Problem  List Items Addressed This Visit       Cardiovascular and Mediastinum   Essential hypertension, benign - Primary    BP at goal. However, given Bradycardia today I am stopping Metoprolol. Patient to return next week for re-evaluation. Continue Amlodipine. Refilled today.      Relevant Medications   amLODipine (NORVASC) 10 MG tablet     Genitourinary   CKD (chronic kidney disease) stage 3, GFR 30-59 ml/min (HCC)    Unsure of current status.  Labs today.      Relevant Orders   CBC   Comprehensive metabolic panel   Hemoglobin A1c     Other   Bradycardia    Bradycardic today.  EKG -sinus bradycardia at the rate of 49.  Significant artifact noted.  No appreciable ST or T wave changes. Stopping metoprolol.  Labs today. Reevaluation next week.      Pure hypercholesterolemia    Lipid panel today. Unsure of control.       Relevant Medications   amLODipine (NORVASC) 10 MG tablet   Other Relevant Orders   Lipid panel   Other Visit Diagnoses     Dizziness       Relevant Orders   EKG 12-Lead (Completed)   Need for vaccination       Relevant Orders   Pneumococcal polysaccharide vaccine 23-valent greater than or equal to 2yo subcutaneous/IM (Completed)   Flu Vaccine QUAD High Dose(Fluad) (Completed)       Meds ordered this encounter  Medications   amLODipine (NORVASC) 10 MG tablet    Sig: Take 1 tablet (10 mg total) by mouth at bedtime.    Dispense:  90 tablet    Refill:  1    Follow-up:  Return Needs appt next week for follow up bradycardia and hypertension.  [place]

## 2021-07-31 NOTE — Assessment & Plan Note (Signed)
BP at goal. However, given Bradycardia today I am stopping Metoprolol. Patient to return next week for re-evaluation. Continue Amlodipine. Refilled today.

## 2021-07-31 NOTE — Assessment & Plan Note (Signed)
Bradycardic today.  EKG -sinus bradycardia at the rate of 49.  Significant artifact noted.  No appreciable ST or T wave changes. Stopping metoprolol.  Labs today. Reevaluation next week.

## 2021-07-31 NOTE — Assessment & Plan Note (Signed)
Lipid panel today. Unsure of control.

## 2021-08-01 LAB — COMPREHENSIVE METABOLIC PANEL
ALT: 11 IU/L (ref 0–32)
AST: 19 IU/L (ref 0–40)
Albumin/Globulin Ratio: 2.2 (ref 1.2–2.2)
Albumin: 4.7 g/dL (ref 3.7–4.7)
Alkaline Phosphatase: 81 IU/L (ref 44–121)
BUN/Creatinine Ratio: 11 — ABNORMAL LOW (ref 12–28)
BUN: 17 mg/dL (ref 8–27)
Bilirubin Total: 0.4 mg/dL (ref 0.0–1.2)
CO2: 22 mmol/L (ref 20–29)
Calcium: 10.2 mg/dL (ref 8.7–10.3)
Chloride: 106 mmol/L (ref 96–106)
Creatinine, Ser: 1.6 mg/dL — ABNORMAL HIGH (ref 0.57–1.00)
Globulin, Total: 2.1 g/dL (ref 1.5–4.5)
Glucose: 94 mg/dL (ref 70–99)
Potassium: 4.6 mmol/L (ref 3.5–5.2)
Sodium: 144 mmol/L (ref 134–144)
Total Protein: 6.8 g/dL (ref 6.0–8.5)
eGFR: 33 mL/min/{1.73_m2} — ABNORMAL LOW (ref >59)

## 2021-08-01 LAB — CBC
Hematocrit: 45.6 % (ref 34.0–46.6)
Hemoglobin: 14.8 g/dL (ref 11.1–15.9)
MCH: 30.7 pg (ref 26.6–33.0)
MCHC: 32.5 g/dL (ref 31.5–35.7)
MCV: 95 fL (ref 79–97)
Platelets: 245 10*3/uL (ref 150–450)
RBC: 4.82 x10E6/uL (ref 3.77–5.28)
RDW: 13.3 % (ref 11.7–15.4)
WBC: 6.1 10*3/uL (ref 3.4–10.8)

## 2021-08-01 LAB — LIPID PANEL
Chol/HDL Ratio: 3.1 ratio (ref 0.0–4.4)
Cholesterol, Total: 194 mg/dL (ref 100–199)
HDL: 63 mg/dL (ref >39)
LDL Chol Calc (NIH): 101 mg/dL — ABNORMAL HIGH (ref 0–99)
Triglycerides: 174 mg/dL — ABNORMAL HIGH (ref 0–149)
VLDL Cholesterol Cal: 30 mg/dL (ref 5–40)

## 2021-08-01 LAB — HEMOGLOBIN A1C
Est. average glucose Bld gHb Est-mCnc: 108 mg/dL
Hgb A1c MFr Bld: 5.4 % (ref 4.8–5.6)

## 2021-08-05 ENCOUNTER — Other Ambulatory Visit: Payer: Self-pay

## 2021-08-05 ENCOUNTER — Ambulatory Visit (INDEPENDENT_AMBULATORY_CARE_PROVIDER_SITE_OTHER): Payer: Medicare Other | Admitting: Family Medicine

## 2021-08-05 VITALS — BP 147/85 | HR 70 | Temp 97.2°F | Ht 66.0 in | Wt 147.0 lb

## 2021-08-05 DIAGNOSIS — I10 Essential (primary) hypertension: Secondary | ICD-10-CM | POA: Diagnosis not present

## 2021-08-05 DIAGNOSIS — R001 Bradycardia, unspecified: Secondary | ICD-10-CM | POA: Diagnosis not present

## 2021-08-05 NOTE — Patient Instructions (Signed)
Follow up in 6 months.  Call with concerns.  Take care  Dr. Lacinda Axon

## 2021-08-05 NOTE — Assessment & Plan Note (Signed)
Now resolved following discontinuation of beta-blocker.  No beta-blockers in the future.

## 2021-08-05 NOTE — Assessment & Plan Note (Signed)
BP is stable.  We will continue to monitor.  I am electing to proceed with a higher goal for this patient 150/90 (JNC 8 guidelines).  Continue amlodipine.

## 2021-08-05 NOTE — Progress Notes (Signed)
Subjective:  Patient ID: Christine Newman, female    DOB: [birth year]  Age: 80 y.o. MRN: [id number]  CC: Chief Complaint  Patient presents with   Hypertension    HPI:  80 year old female with HTN, CKD, Crohn's in remission, HLD presents for follow-up regarding recent bradycardia.  Patient's last visit she was found to be bradycardic.  No evidence of heart block on EKG.  Heart rate was 43.  Metoprolol was discontinued.  Patient has been checking her blood pressures at home.  She has had 1 outlier, but the rest of her blood pressures have been under 150/90.  She states that she has had no further dizziness.  She is feeling well at this time.  Patient Active Problem List   Diagnosis Date Noted   History of kidney stones 07/31/2021   History of diverticulitis 07/31/2021   Osteoarthritis 07/31/2021   Bradycardia 07/31/2021   Pure hypercholesterolemia 07/31/2021   Osteopenia 05/11/2021   Crohn's disease in remission ([place]) 09/15/2018   Generalized anxiety disorder 09/27/2015   CKD (chronic kidney disease) stage 3, GFR 30-59 ml/min ([place]) 01/14/2015   Essential hypertension, benign 03/11/2013    Social Hx   Social History   Socioeconomic History   Marital status: Widowed    Spouse name: Not on file   Number of children: Not on file   Years of education: Not on file   Highest education level: Not on file  Occupational History   Not on file  Tobacco Use   Smoking status: Former    Packs/day: 0.25    Years: 5.00    Pack years: 1.25    Types: Cigarettes    Quit date: 01/18/1962    Years since quitting: 59.5   Smokeless tobacco: Never   Tobacco comments:    1 pack cigarettes per week ; quit in her early 46s   Vaping Use   Vaping Use: Never used  Substance and Sexual Activity   Alcohol use: No   Drug use: Never   Sexual activity: Not Currently    Birth control/protection: Post-menopausal  Other Topics Concern   Not on file  Social History Narrative   Not on file    Social Determinants of Health   Financial Resource Strain: Not on file  Food Insecurity: Not on file  Transportation Needs: Not on file  Physical Activity: Not on file  Stress: Not on file  Social Connections: Not on file    Review of Systems  Constitutional: Negative.   Neurological:  Negative for dizziness.    Objective:  BP (!) 147/85   Pulse 70   Temp (!) 97.2 F (36.2 C)   Ht 5' 6"  (1.676 m)   Wt 147 lb (66.7 kg)   LMP  (LMP Unknown)   SpO2 98%   BMI 23.73 kg/m   BP/Weight 08/05/2021 07/31/2021 3/57/0177  Systolic BP 939 030 092  Diastolic BP 85 78 84  Wt. (Lbs) 147 146.6 146  BMI 23.73 23.66 23.57    Physical Exam Constitutional:      General: She is not in acute distress.    Appearance: Normal appearance. She is not ill-appearing.  Eyes:     General:        Right eye: No discharge.        Left eye: No discharge.     Conjunctiva/sclera: Conjunctivae normal.  Cardiovascular:     Rate and Rhythm: Normal rate and regular rhythm.     Heart sounds: No murmur  heard. Pulmonary:     Effort: Pulmonary effort is normal.     Breath sounds: Normal breath sounds. No wheezing, rhonchi or rales.  Neurological:     Mental Status: She is alert.  Psychiatric:        Mood and Affect: Mood normal.        Behavior: Behavior normal.    Lab Results  Component Value Date   WBC 6.1 07/31/2021   HGB 14.8 07/31/2021   HCT 45.6 07/31/2021   PLT 245 07/31/2021   GLUCOSE 94 07/31/2021   CHOL 194 07/31/2021   TRIG 174 (H) 07/31/2021   HDL 63 07/31/2021   LDLCALC 101 (H) 07/31/2021   ALT 11 07/31/2021   AST 19 07/31/2021   NA 144 07/31/2021   K 4.6 07/31/2021   CL 106 07/31/2021   CREATININE 1.60 (H) 07/31/2021   BUN 17 07/31/2021   CO2 22 07/31/2021   INR 1.06 01/05/2018   HGBA1C 5.4 07/31/2021     Assessment & Plan:   Problem List Items Addressed This Visit       Cardiovascular and Mediastinum   Essential hypertension, benign - Primary    BP is  stable.  We will continue to monitor.  I am electing to proceed with a higher goal for this patient 150/90 (JNC 8 guidelines).  Continue amlodipine.        Other   Bradycardia    Now resolved following discontinuation of beta-blocker.  No beta-blockers in the future.      Follow-up:  6 months  [place]

## 2021-08-20 ENCOUNTER — Ambulatory Visit (HOSPITAL_COMMUNITY)
Admission: RE | Admit: 2021-08-20 | Discharge: 2021-08-20 | Disposition: A | Discharge status: Home / Self Care | Payer: Medicare Other | Source: Ambulatory Visit | Attending: Urology | Admitting: Urology

## 2021-08-20 ENCOUNTER — Other Ambulatory Visit: Payer: Self-pay

## 2021-08-20 DIAGNOSIS — N200 Calculus of kidney: Secondary | ICD-10-CM | POA: Diagnosis present

## 2021-08-24 ENCOUNTER — Ambulatory Visit (INDEPENDENT_AMBULATORY_CARE_PROVIDER_SITE_OTHER): Payer: Medicare Other | Admitting: Urology

## 2021-08-24 ENCOUNTER — Other Ambulatory Visit: Payer: Self-pay

## 2021-08-24 ENCOUNTER — Encounter: Payer: Self-pay | Admitting: Urology

## 2021-08-24 VITALS — BP 160/82 | HR 69 | Temp 98.4°F

## 2021-08-24 DIAGNOSIS — N200 Calculus of kidney: Secondary | ICD-10-CM | POA: Diagnosis not present

## 2021-08-24 NOTE — Progress Notes (Signed)
08/24/2021 9:58 AM   Christine Newman November 21, 1940 [id number]  Referring provider: Erven Colla, DO [place],  [place] [id number]  Followup nephrolithiasis   HPI: Christine Newman is a 80yo here for followup for nephrolithiasis. No stone events since last visit. KUB from 11/10 shows stable tiny left lower pole calculi. No flank pain. No stone events since last visit. NO significant LUTS.    PMH: Past Medical History:  Diagnosis Date   Anal polyp 2005   condyloma acuminatum   Anemia    CKD (chronic kidney disease)    Cr 1.9 -09/2014   Colonic mass    Diverticulitis    with abscess   History of kidney stones    Hypertension     Surgical History: Past Surgical History:  Procedure Laterality Date   anal polypectomy  01/02/2004   Dr.Rosenbower- anal polyp removed. bx= condyloma acuminatum   CATARACT EXTRACTION W/PHACO Left 08/10/2018   Procedure: CATARACT EXTRACTION PHACO AND INTRAOCULAR LENS PLACEMENT LEFT EYE;  Surgeon: Tonny Branch, MD;  Location: [facility];  Service: Ophthalmology;  Laterality: Left;  left   CATARACT EXTRACTION W/PHACO Right 09/04/2018   Procedure: CATARACT EXTRACTION PHACO AND INTRAOCULAR LENS PLACEMENT (IOC);  Surgeon: Tonny Branch, MD;  Location: [facility];  Service: Ophthalmology;  Laterality: Right;  CDE: 11.16   COLONOSCOPY  04/08/2003   Dr.Mann- small nonbleeding internal and external hemorrhoids small polypoid mass at anal verge, normal appearing L colon, transverse colon, R colon including cecum. stenotic ileocecal valve. bx of maa= benign rectal type mucosa with features of mucosal prolapse syndrome   COLONOSCOPY N/A 04/02/2015   Dr.Rourk- colonic diverticulosis, no evidence of colonic neoplasm. abnormal ileocecal valve with ulceration bx= ulcerated/eroded benign ileocecal valve mucosa   COLONOSCOPY N/A 06/14/2018   scattered diverticula in sigmoid and descending colon. Distal 10 cm of neoterminal ileum appears normal. Appears to have surgical  remission   FLEXIBLE SIGMOIDOSCOPY  11/06/2003   Dr.Mann- small nodular mass at anal verge, bx done. early L side diverticula. bx= benign rectal mucosa with features of mucosal prolapse syndrome. no adenomatous changes or evidence of malignancy identified.    IR NEPHROSTOMY PLACEMENT LEFT  01/05/2018   KIDNEY STONE SURGERY     LAPAROSCOPY N/A 11/02/2017   Procedure: LAPAROSCOPIC ILEOCECECTOMY;  Surgeon: Leighton Ruff, MD;  Location: [facility];  Service: General;  Laterality: N/A;   NEPHROLITHOTOMY Left 02/13/2018   Procedure: NEPHROLITHOTOMY PERCUTANEOUS;  Surgeon: Cleon Gustin, MD;  Location: [facility];  Service: Urology;  Laterality: Left;    Home Medications:  Allergies as of 08/24/2021       Reactions   Metoprolol    Bradycardia   No Known Allergies         Medication List        Accurate as of August 24, 2021  9:58 AM. If you have any questions, ask your nurse or doctor.          amLODipine 10 MG tablet Commonly known as: NORVASC Take 1 tablet (10 mg total) by mouth at bedtime.   cholecalciferol 1000 units tablet Commonly known as: VITAMIN D Take 1,000 Units by mouth daily.   folic acid 270 MCG tablet Commonly known as: FOLVITE Take 400 mcg by mouth daily.   multivitamin with minerals tablet Take 1 tablet by mouth daily.   Potassium Citrate 15 MEQ (1620 MG) Tbcr Commonly known as: Urocit-K 15 Take 1 tablet by mouth 2 (two) times daily.   vitamin B-12 1000 MCG  tablet Commonly known as: CYANOCOBALAMIN Take 1,000 mcg by mouth daily.        Allergies:  Allergies  Allergen Reactions   Metoprolol     Bradycardia   No Known Allergies     Family History: Family History  Problem Relation Age of Onset   Hypertension Mother    Heart disease Father    Cancer Sister        breast   Colon cancer Neg Hx    Colon polyps Neg Hx     Social History:  reports that she quit smoking about 59 years ago. Her smoking use included cigarettes. She has a 1.25  pack-year smoking history. She has never used smokeless tobacco. She reports that she does not drink alcohol and does not use drugs.  ROS: All other review of systems were reviewed and are negative except what is noted above in HPI  Physical Exam: BP (!) 160/82   Pulse 69   Temp 98.4 F (36.9 C)   LMP  (LMP Unknown)   Constitutional:  Alert and oriented, No acute distress. HEENT: [place] AT, moist mucus membranes.  Trachea midline, no masses. Cardiovascular: No clubbing, cyanosis, or edema. Respiratory: Normal respiratory effort, no increased work of breathing. GI: Abdomen is soft, nontender, nondistended, no abdominal masses GU: No CVA tenderness.  Lymph: No cervical or inguinal lymphadenopathy. Skin: No rashes, bruises or suspicious lesions. Neurologic: Grossly intact, no focal deficits, moving all 4 extremities. Psychiatric: Normal mood and affect.  Laboratory Data: Lab Results  Component Value Date   WBC 6.1 07/31/2021   HGB 14.8 07/31/2021   HCT 45.6 07/31/2021   MCV 95 07/31/2021   PLT 245 07/31/2021    Lab Results  Component Value Date   CREATININE 1.60 (H) 07/31/2021    No results found for: PSA  No results found for: TESTOSTERONE  Lab Results  Component Value Date   HGBA1C 5.4 07/31/2021    Urinalysis    Component Value Date/Time   COLORURINE YELLOW 01/05/2018 1404   APPEARANCEUR Clear 02/11/2021 0951   LABSPEC 1.018 01/05/2018 1404   PHURINE 5.0 01/05/2018 1404   GLUCOSEU Negative 02/11/2021 0951   HGBUR MODERATE (A) 01/05/2018 1404   BILIRUBINUR Negative 02/11/2021 0951   KETONESUR NEGATIVE 01/05/2018 1404   PROTEINUR Trace (A) 02/11/2021 0951   PROTEINUR NEGATIVE 01/05/2018 1404   UROBILINOGEN 0.2 02/11/2020 1530   UROBILINOGEN 0.2 01/13/2015 1908   NITRITE Negative 02/11/2021 0951   NITRITE NEGATIVE 01/05/2018 1404   LEUKOCYTESUR 1+ (A) 02/11/2021 0951    Lab Results  Component Value Date   LABMICR See below: 02/11/2021   WBCUA 11-30 (A)  02/11/2021   LABEPIT 0-10 02/11/2021   MUCUS Present 02/11/2021   BACTERIA Many (A) 02/11/2021    Pertinent Imaging: KUB 11/10: Images reviewed and discussed with the patient Results for orders placed during the hospital encounter of 08/20/21  DG Abd 1 View  Narrative CLINICAL DATA:  Nephrolithiasis.  EXAM: ABDOMEN - 1 VIEW  COMPARISON:  Most recent radiograph 02/09/2021  FINDINGS: Two small calcifications again project over the upper left renal shadow. No calcifications over the right renal bed. Unchanged pelvic calcifications typical of phleboliths. Enteric chain sutures in the right mid abdomen. Normal bowel gas pattern. Similar degenerative change in the spine.  IMPRESSION: Two small calcifications again project over the upper left renal shadow, unchanged from prior exam, likely nephrolithiasis.   Electronically Signed By: Keith Rake M.D. On: 08/21/2021 16:54  No results found for this  or any previous visit.  No results found for this or any previous visit.  No results found for this or any previous visit.  Results for orders placed during the hospital encounter of 07/03/18  US RENAL  Narrative CLINICAL DATA:  80 year old female with right kidney stone. Six-month follow-up.  EXAM: RENAL / URINARY TRACT ULTRASOUND COMPLETE  COMPARISON:  04/19/2018 renal sonogram. 04/05/2018 CT. Plain film of the abdomen same date dictated separately.  FINDINGS: Right Kidney:  Length: 9.8 cm. Slight increased renal parenchyma echogenicity. No hydronephrosis. 6 mm nonobstructing stone mid aspect. No right renal mass identified.  Left Kidney:  Length: 7.8 cm. Atrophic and difficult to adequately assess. No obvious mass or hydronephrosis.  Bladder:  Not evaluated as empty.  IMPRESSION: 1. 6 mm nonobstructing right renal calculi. No right-sided hydronephrosis. Mild increased renal parenchyma echogenicity of the right kidney may represent changes of mild  medical renal disease. 2. Atrophic left kidney difficult to visualize. No obvious left-sided hydronephrosis or mass.   Electronically Signed By: Genia Del M.D. On: 07/03/2018 16:35  No results found for this or any previous visit.  No results found for this or any previous visit.  Results for orders placed during the hospital encounter of 02/13/18  CT RENAL STONE STUDY  Narrative CLINICAL DATA:  Left-sided pain. Reported left percutaneous nephrolithotomy 1 day prior.  EXAM: CT ABDOMEN AND PELVIS WITHOUT CONTRAST  TECHNIQUE: Multidetector CT imaging of the abdomen and pelvis was performed following the standard protocol without IV contrast.  COMPARISON:  02/08/2018 CT abdomen/pelvis.  FINDINGS: Lower chest: Subsegmental dependent bibasilar atelectasis. Subcentimeter calcified medial left lower lobe granuloma. Coronary atherosclerosis.  Hepatobiliary: Normal liver size. Simple 1.0 cm posterior right liver lobe cyst. Subcentimeter hypodense liver lesions near the gallbladder fossa (series 2/image 21), too small to characterize, stable, probably benign. No new liver lesions. Mildly distended gallbladder. No radiopaque cholelithiasis. No gallbladder wall thickening. No pericholecystic fluid. No biliary ductal dilatation.  Pancreas: Normal, with no mass or duct dilation.  Spleen: Normal size. No mass.  Adrenals/Urinary Tract: Normal adrenals. Asymmetric severe left renal parenchymal atrophy. Large bore left percutaneous nephrostomy tube terminates in the left renal pelvis. Well-positioned left nephroureteral stent with proximal pigtail portion in the left renal pelvis and distal pigtail portion in the bladder. Expected gas scattered in the left renal collecting system. A few mildly dilated calices in the left renal collecting system. Left renal pelvis collapse. Nonspecific haziness of the central left renal sinus fat, presumably inflammatory or due to postprocedure  change. No residual stone fragments in the left renal collecting system or left ureter. Mild ill-defined fluid and haziness in the left perinephric retroperitoneum. No contour deforming left renal mass.  Nonobstructing 4 mm interpolar and 2 mm lower right renal stones. Small simple parapelvic right renal cysts. No right hydronephrosis. Normal caliber right ureter, with no right ureteral stones. No contour deforming right renal mass.  Collapsed bladder. No bladder stones. Expected gas in the nondependent bladder from instrumentation.  Stomach/Bowel: Normal non-distended stomach. Stable postsurgical changes from ileocecal resection with intact appearing neo ileocolic anastomosis in the right abdomen. No small bowel wall thickening. A few mildly dilated small bowel loops in the right abdomen up to the 4.3 cm diameter without focal small bowel caliber transition. Remnant large bowel is normal caliber with no wall thickening or significant pericolonic fat stranding.  Vascular/Lymphatic: Atherosclerotic nonaneurysmal abdominal aorta. No pathologically enlarged lymph nodes in the abdomen or pelvis.  Reproductive: Grossly normal uterus.  No adnexal  mass.  Other: No pneumoperitoneum, ascites or focal fluid collection.  Musculoskeletal: No aggressive appearing focal osseous lesions. Moderate lumbar spondylosis.  IMPRESSION: 1. Expected postprocedural change status post left percutaneous nephrolithotomy. No residual stone fragments in the left renal collecting system or left ureter. Well-positioned left percutaneous nephrostomy tube and left nephroureteral stent. A few dilated calices in the left renal collecting system with nondilated left renal pelvis. No focal fluid collections. 2. Nonobstructing right nephrolithiasis. 3. Mildly dilated right abdominal small bowel loops, favor mild post procedural ileus. 4.  Aortic Atherosclerosis (ICD10-I70.0).   Electronically Signed By: Ilona Sorrel M.D. On: 02/14/2018 13:16   Assessment & Plan:    1. Calculus, renal -RTC 1 year with KUB   No follow-ups on file.  Nicolette Bang, MD  [facility] [place]

## 2021-08-24 NOTE — Patient Instructions (Signed)
Dietary Guidelines to Help Prevent Kidney Stones Kidney stones are deposits of minerals and salts that form inside your kidneys. Your risk of developing kidney stones may be greater depending on your diet, your lifestyle, the medicines you take, and whether you have certain medical conditions. Most people can lower their chances of developing kidney stones by following the instructions below. Your dietitian may give you more specific instructions depending on your overall health and the type of kidney stones you tend to develop. What are tips for following this plan? Reading food labels  Choose foods with "no salt added" or "low-salt" labels. Limit your salt (sodium) intake to less than 1,500 mg a day. Choose foods with calcium for each meal and snack. Try to eat about 300 mg of calcium at each meal. Foods that contain 200-500 mg of calcium a serving include: 8 oz (237 mL) of milk, calcium-fortifiednon-dairy milk, and calcium-fortifiedfruit juice. Calcium-fortified means that calcium has been added to these drinks. 8 oz (237 mL) of kefir, yogurt, and soy yogurt. 4 oz (114 g) of tofu. 1 oz (28 g) of cheese. 1 cup (150 g) of dried figs. 1 cup (91 g) of cooked broccoli. One 3 oz (85 g) can of sardines or mackerel. Most people need 1,000-1,500 mg of calcium a day. Talk to your dietitian about how much calcium is recommended for you. Shopping Buy plenty of fresh fruits and vegetables. Most people do not need to avoid fruits and vegetables, even if these foods contain nutrients that may contribute to kidney stones. When shopping for convenience foods, choose: Whole pieces of fruit. Pre-made salads with dressing on the side. Low-fat fruit and yogurt smoothies. Avoid buying frozen meals or prepared deli foods. These can be high in sodium. Look for foods with live cultures, such as yogurt and kefir. Choose high-fiber grains, such as whole-wheat breads, oat bran, and wheat cereals. Cooking Do not add  salt to food when cooking. Place a salt shaker on the table and allow each person to add his or her own salt to taste. Use vegetable protein, such as beans, textured vegetable protein (TVP), or tofu, instead of meat in pasta, casseroles, and soups. Meal planning Eat less salt, if told by your dietitian. To do this: Avoid eating processed or pre-made food. Avoid eating fast food. Eat less animal protein, including cheese, meat, poultry, or fish, if told by your dietitian. To do this: Limit the number of times you have meat, poultry, fish, or cheese each week. Eat a diet free of meat at least 2 days a week. Eat only one serving each day of meat, poultry, fish, or seafood. When you prepare animal protein, cut pieces into small portion sizes. For most meat and fish, one serving is about the size of the palm of your hand. Eat at least five servings of fresh fruits and vegetables each day. To do this: Keep fruits and vegetables on hand for snacks. Eat one piece of fruit or a handful of berries with breakfast. Have a salad and fruit at lunch. Have two kinds of vegetables at dinner. Limit foods that are high in a substance called oxalate. These include: Spinach (cooked), rhubarb, beets, sweet potatoes, and Swiss chard. Peanuts. Potato chips, french fries, and baked potatoes with skin on. Nuts and nut products. Chocolate. If you regularly take a diuretic medicine, make sure to eat at least 1 or 2 servings of fruits or vegetables that are high in potassium each day. These include: Avocado. Banana. Orange, prune,  carrot, or tomato juice. Baked potato. Cabbage. Beans and split peas. Lifestyle  Drink enough fluid to keep your urine pale yellow. This is the most important thing you can do. Spread your fluid intake throughout the day. If you drink alcohol: Limit how much you use to: 0-1 drink a day for women who are not pregnant. 0-2 drinks a day for men. Be aware of how much alcohol is in your  drink. In the [place] one drink equals one 12 oz bottle of beer (355 mL), one 5 oz glass of wine (148 mL), or one 1 oz glass of hard liquor (44 mL). Lose weight if told by your health care provider. Work with your dietitian to find an eating plan and weight loss strategies that work best for you. General information Talk to your health care provider and dietitian about taking daily supplements. You may be told the following depending on your health and the cause of your kidney stones: Not to take supplements with vitamin C. To take a calcium supplement. To take a daily probiotic supplement. To take other supplements such as magnesium, fish oil, or vitamin B6. Take over-the-counter and prescription medicines only as told by your health care provider. These include supplements. What foods should I limit? Limit your intake of the following foods, or eat them as told by your dietitian. Vegetables Spinach. Rhubarb. Beets. Canned vegetables. Christine Newman. Olives. Baked potatoes with skin. Grains Wheat bran. Baked goods. Salted crackers. Cereals high in sugar. Meats and other proteins Nuts. Nut butters. Large portions of meat, poultry, or fish. Salted, precooked, or cured meats, such as sausages, meat loaves, and hot dogs. Dairy Cheese. Beverages Regular soft drinks. Regular vegetable juice. Seasonings and condiments Seasoning blends with salt. Salad dressings. Soy sauce. Ketchup. Barbecue sauce. Other foods Canned soups. Canned pasta sauce. Casseroles. Pizza. Lasagna. Frozen meals. Potato chips. [place] fries. The items listed above may not be a complete list of foods and beverages you should limit. Contact a dietitian for more information. What foods should I avoid? Talk to your dietitian about specific foods you should avoid based on the type of kidney stones you have and your overall health. Fruits Grapefruit. The item listed above may not be a complete list of foods and beverages you should  avoid. Contact a dietitian for more information. Summary Kidney stones are deposits of minerals and salts that form inside your kidneys. You can lower your risk of kidney stones by making changes to your diet. The most important thing you can do is drink enough fluid. Drink enough fluid to keep your urine pale yellow. Talk to your dietitian about how much calcium you should have each day, and eat less salt and animal protein as told by your dietitian. This information is not intended to replace advice given to you by your health care provider. Make sure you discuss any questions you have with your health care provider. Document Revised: 09/20/2019 Document Reviewed: 09/20/2019 Elsevier Patient Education  2022 [employer].

## 2021-08-24 NOTE — Progress Notes (Signed)
Urological Symptom Review  Patient is experiencing the following symptoms: Get up at night to urinate   Review of Systems  Gastrointestinal (upper)  : Negative for upper GI symptoms  Gastrointestinal (lower) : Negative for lower GI symptoms  Constitutional : Negative for symptoms  Skin: Negative for skin symptoms  Eyes: Negative for eye symptoms  Ear/Nose/Throat : Negative for Ear/Nose/Throat symptoms  Hematologic/Lymphatic: Negative for Hematologic/Lymphatic symptoms  Cardiovascular : Negative for cardiovascular symptoms  Respiratory : Negative for respiratory symptoms  Endocrine: Negative for endocrine symptoms  Musculoskeletal: Joint pain  Neurological: Negative for neurological symptoms  Psychologic: Negative for psychiatric symptoms

## 2021-09-23 DIAGNOSIS — I129 Hypertensive chronic kidney disease with stage 1 through stage 4 chronic kidney disease, or unspecified chronic kidney disease: Secondary | ICD-10-CM | POA: Diagnosis not present

## 2021-09-23 DIAGNOSIS — N200 Calculus of kidney: Secondary | ICD-10-CM | POA: Diagnosis not present

## 2021-09-23 DIAGNOSIS — N390 Urinary tract infection, site not specified: Secondary | ICD-10-CM | POA: Diagnosis not present

## 2021-09-23 DIAGNOSIS — K5720 Diverticulitis of large intestine with perforation and abscess without bleeding: Secondary | ICD-10-CM | POA: Diagnosis not present

## 2021-09-23 DIAGNOSIS — N183 Chronic kidney disease, stage 3 unspecified: Secondary | ICD-10-CM | POA: Diagnosis not present

## 2021-09-25 DIAGNOSIS — M25552 Pain in left hip: Secondary | ICD-10-CM | POA: Diagnosis not present

## 2021-09-25 DIAGNOSIS — M545 Low back pain, unspecified: Secondary | ICD-10-CM | POA: Diagnosis not present

## 2021-09-25 DIAGNOSIS — M25562 Pain in left knee: Secondary | ICD-10-CM | POA: Diagnosis not present

## 2021-10-06 ENCOUNTER — Telehealth: Payer: Self-pay | Admitting: Internal Medicine

## 2021-10-06 ENCOUNTER — Encounter: Payer: Self-pay | Admitting: Internal Medicine

## 2021-10-06 NOTE — Telephone Encounter (Signed)
RECALL FOR BONE DENSITY

## 2021-10-06 NOTE — Telephone Encounter (Signed)
Pt had done 04/2021. Please advise Dr. Gala Romney if patient is due for another one now?

## 2021-10-07 DIAGNOSIS — R2689 Other abnormalities of gait and mobility: Secondary | ICD-10-CM | POA: Diagnosis not present

## 2021-10-07 DIAGNOSIS — M5440 Lumbago with sciatica, unspecified side: Secondary | ICD-10-CM | POA: Diagnosis not present

## 2021-10-07 DIAGNOSIS — R531 Weakness: Secondary | ICD-10-CM | POA: Diagnosis not present

## 2021-10-07 NOTE — Telephone Encounter (Signed)
Cc'ed bone scan results to PCP

## 2021-10-22 ENCOUNTER — Telehealth: Payer: Self-pay | Admitting: Family Medicine

## 2021-10-22 NOTE — Telephone Encounter (Signed)
°  Left message for patient to call back and schedule Medicare Annual Wellness Visit (AWV) in office.   If unable to come into the office for AWV,  please offer to do virtually or by telephone.  No hx of AWV eligible for AWVI as of 10/11/2009 per [employer]   Please schedule at anytime with [facility].      40 Minutes appointment   Any questions, please call me at [phone]

## 2021-11-17 ENCOUNTER — Ambulatory Visit: Payer: Medicare Other

## 2021-12-14 DIAGNOSIS — M25572 Pain in left ankle and joints of left foot: Secondary | ICD-10-CM | POA: Diagnosis not present

## 2021-12-31 ENCOUNTER — Telehealth: Payer: Self-pay | Admitting: Family Medicine

## 2021-12-31 NOTE — Telephone Encounter (Signed)
?  Left message for patient to call back and schedule Medicare Annual Wellness Visit (AWV) in office.  ? ?If unable to come into the office for AWV,  please offer to do virtually or by telephone. ? ?No hx of AWV eligible for AWVI per [employer] as of  10/11/2009 ? ?Please schedule at anytime with [facility].     ? ?45 minute appointment  ? ?Any questions, please call me at [phone]   ?

## 2022-01-04 DIAGNOSIS — H04123 Dry eye syndrome of bilateral lacrimal glands: Secondary | ICD-10-CM | POA: Diagnosis not present

## 2022-02-02 ENCOUNTER — Ambulatory Visit (INDEPENDENT_AMBULATORY_CARE_PROVIDER_SITE_OTHER): Payer: Medicare Other | Admitting: Family Medicine

## 2022-02-02 ENCOUNTER — Telehealth: Payer: Self-pay | Admitting: Family Medicine

## 2022-02-02 DIAGNOSIS — I10 Essential (primary) hypertension: Secondary | ICD-10-CM | POA: Diagnosis not present

## 2022-02-02 DIAGNOSIS — N183 Chronic kidney disease, stage 3 unspecified: Secondary | ICD-10-CM

## 2022-02-02 MED ORDER — AMLODIPINE BESYLATE 10 MG PO TABS: 10.0000 mg | ORAL_TABLET | Freq: Every day | ORAL | 1 refills | Status: DC

## 2022-02-02 NOTE — Assessment & Plan Note (Signed)
BP improved on recheck. Continue amlodipine.  Advised patient to keep logs of her blood pressure readings at home.  May need additional pharmacotherapy. ?

## 2022-02-02 NOTE — Assessment & Plan Note (Signed)
We will need to get records from nephrology.  Renal function has been stable. ?

## 2022-02-02 NOTE — Telephone Encounter (Signed)
Prescription refilled.

## 2022-02-02 NOTE — Patient Instructions (Signed)
Continue your current medications. ? ?Follow up in 3 months. ? ?If BP stays elevated consistently, please let me know. ? ?Dr. Lacinda Axon  ?

## 2022-02-02 NOTE — Telephone Encounter (Signed)
Patient was just seen and forgot to let you know she needs refill on amlodipine 10 mg sent to [facility], She states has a week left. ?

## 2022-02-02 NOTE — Progress Notes (Signed)
? ?Subjective:  ?Patient ID: Christine Newman, female    DOB: Sep 16, 1941  Age: 81 y.o. MRN: [id number] ? ?CC: ?Chief Complaint  ?Patient presents with  ? Hypertension  ?  Bilateral foot swelling last week 2 to 3 days   ? ? ?HPI: ? ?81 year old female with hypertension, osteopenia, CKD, Crohn's disease in remission, hyperlipidemia presents for follow-up. ? ?Patient reports that she recently had swelling in her feet.  This occurred last week.  Lasted for few days and then resolved.  Currently has no significant swelling. ? ?Patient is followed by nephrology regarding her chronic kidney disease. ? ?Upon arrival here, blood pressure was markedly elevated at 180/91.  Upon recheck blood pressure was 140/70.  Patient states that she has labile pressures at home.  She is compliant with amlodipine.  She checks her blood pressure periodically. ? ?Patient Active Problem List  ? Diagnosis Date Noted  ? History of kidney stones 07/31/2021  ? History of diverticulitis 07/31/2021  ? Osteoarthritis 07/31/2021  ? Pure hypercholesterolemia 07/31/2021  ? Osteopenia 05/11/2021  ? Crohn's disease in remission ([place]) 09/15/2018  ? Generalized anxiety disorder 09/27/2015  ? CKD (chronic kidney disease) stage 3, GFR 30-59 ml/min (HCC) 01/14/2015  ? Essential hypertension, benign 03/11/2013  ? ? ?Social Hx   ?Social History  ? ?Socioeconomic History  ? Marital status: Widowed  ?  Spouse name: Not on file  ? Number of children: Not on file  ? Years of education: Not on file  ? Highest education level: Not on file  ?Occupational History  ? Not on file  ?Tobacco Use  ? Smoking status: Former  ?  Packs/day: 0.25  ?  Years: 5.00  ?  Pack years: 1.25  ?  Types: Cigarettes  ?  Quit date: 01/18/1962  ?  Years since quitting: 60.0  ? Smokeless tobacco: Never  ? Tobacco comments:  ?  1 pack cigarettes per week ; quit in her early 26s   ?Vaping Use  ? Vaping Use: Never used  ?Substance and Sexual Activity  ? Alcohol use: No  ? Drug use: Never  ? Sexual  activity: Not Currently  ?  Birth control/protection: Post-menopausal  ?Other Topics Concern  ? Not on file  ?Social History Narrative  ? Not on file  ? ?Social Determinants of Health  ? ?Financial Resource Strain: Not on file  ?Food Insecurity: Not on file  ?Transportation Needs: Not on file  ?Physical Activity: Not on file  ?Stress: Not on file  ?Social Connections: Not on file  ? ? ?Review of Systems  ?Constitutional: Negative.   ?Respiratory:    ?     Recent leg swelling.  ? ? ?Objective:  ?BP 140/70   Pulse 61   Temp (!) 97 ?F (36.1 ?C)   Ht 5' 6"  (1.676 m)   Wt 143 lb (64.9 kg)   LMP  (LMP Unknown)   BMI 23.08 kg/m?  ? ? ?  02/02/2022  ?  9:40 AM 02/02/2022  ?  9:24 AM 02/02/2022  ?  9:18 AM  ?BP/Weight  ?Systolic BP 048 889 169  ?Diastolic BP 70 80 91  ?Wt. (Lbs)   143  ?BMI   23.08 kg/m2  ? ? ?Physical Exam ?Vitals and nursing note reviewed.  ?Constitutional:   ?   General: She is not in acute distress. ?HENT:  ?   Head: Normocephalic and atraumatic.  ?Eyes:  ?   General:     ?   Right  eye: No discharge.     ?   Left eye: No discharge.  ?   Conjunctiva/sclera: Conjunctivae normal.  ?Cardiovascular:  ?   Rate and Rhythm: Normal rate and regular rhythm.  ?Pulmonary:  ?   Effort: Pulmonary effort is normal.  ?   Breath sounds: Normal breath sounds. No wheezing, rhonchi or rales.  ?Neurological:  ?   Mental Status: She is alert.  ?Psychiatric:     ?   Mood and Affect: Mood normal.     ?   Behavior: Behavior normal.  ? ? ?Lab Results  ?Component Value Date  ? WBC 6.1 07/31/2021  ? HGB 14.8 07/31/2021  ? HCT 45.6 07/31/2021  ? PLT 245 07/31/2021  ? GLUCOSE 94 07/31/2021  ? CHOL 194 07/31/2021  ? TRIG 174 (H) 07/31/2021  ? HDL 63 07/31/2021  ? LDLCALC 101 (H) 07/31/2021  ? ALT 11 07/31/2021  ? AST 19 07/31/2021  ? NA 144 07/31/2021  ? K 4.6 07/31/2021  ? CL 106 07/31/2021  ? CREATININE 1.60 (H) 07/31/2021  ? BUN 17 07/31/2021  ? CO2 22 07/31/2021  ? INR 1.06 01/05/2018  ? HGBA1C 5.4 07/31/2021  ? ? ? ?Assessment  & Plan:  ? ?Problem List Items Addressed This Visit   ? ?  ? Cardiovascular and Mediastinum  ? Essential hypertension, benign  ?  BP improved on recheck. Continue amlodipine.  Advised patient to keep logs of her blood pressure readings at home.  May need additional pharmacotherapy. ? ?  ?  ?  ? Genitourinary  ? CKD (chronic kidney disease) stage 3, GFR 30-59 ml/min (HCC)  ?  We will need to get records from nephrology.  Renal function has been stable. ? ?  ?  ? ? ?Follow-up:  Return in about 3 months (around 05/04/2022). ? ?Thersa Salt DO ?[facility] ? ?

## 2022-02-03 ENCOUNTER — Ambulatory Visit: Payer: Medicare Other | Admitting: Family Medicine

## 2022-02-10 ENCOUNTER — Telehealth: Payer: Self-pay | Admitting: Family Medicine

## 2022-02-10 DIAGNOSIS — N183 Chronic kidney disease, stage 3 unspecified: Secondary | ICD-10-CM

## 2022-02-10 DIAGNOSIS — I10 Essential (primary) hypertension: Secondary | ICD-10-CM

## 2022-02-10 NOTE — Telephone Encounter (Signed)
Please advise. Thank you

## 2022-02-10 NOTE — Telephone Encounter (Signed)
Patient brought by her b/p readings from the past week. ?4/26 @ 7:30--160/89 ?4/27 @ 7:30--151/85 ?4/28 @ 8:00--153/84 ?4/29 @ 8:30--138/87 ?4/30 @ 8:30--130/77 ?5/1 @ 8:30--125/82 ?5/2 @ 7:50-- 147/89 ?5/3 @ 8:00--155/90 ?All of these were in the morning before coffee per patient. ? ?CB# [phone] ?

## 2022-02-11 ENCOUNTER — Other Ambulatory Visit: Payer: Self-pay | Admitting: Family Medicine

## 2022-02-11 NOTE — Telephone Encounter (Signed)
Pt contacted. Pt is OK with beginning additional med as long as it does not interfere with kidneys. Pt aware of blood work 7-10 days after starting. Pt verbalized understanding.  ?

## 2022-02-12 ENCOUNTER — Other Ambulatory Visit: Payer: Self-pay | Admitting: Family Medicine

## 2022-02-12 MED ORDER — CLONIDINE HCL 0.1 MG PO TABS: 0.1000 mg | ORAL_TABLET | Freq: Two times a day (BID) | ORAL | 1 refills | Status: DC

## 2022-03-23 DIAGNOSIS — N390 Urinary tract infection, site not specified: Secondary | ICD-10-CM | POA: Diagnosis not present

## 2022-03-23 DIAGNOSIS — K5720 Diverticulitis of large intestine with perforation and abscess without bleeding: Secondary | ICD-10-CM | POA: Diagnosis not present

## 2022-03-23 DIAGNOSIS — N183 Chronic kidney disease, stage 3 unspecified: Secondary | ICD-10-CM | POA: Diagnosis not present

## 2022-03-23 DIAGNOSIS — I129 Hypertensive chronic kidney disease with stage 1 through stage 4 chronic kidney disease, or unspecified chronic kidney disease: Secondary | ICD-10-CM | POA: Diagnosis not present

## 2022-03-23 DIAGNOSIS — N200 Calculus of kidney: Secondary | ICD-10-CM | POA: Diagnosis not present

## 2022-03-29 DIAGNOSIS — H26493 Other secondary cataract, bilateral: Secondary | ICD-10-CM | POA: Diagnosis not present

## 2022-04-02 DIAGNOSIS — E7800 Pure hypercholesterolemia, unspecified: Secondary | ICD-10-CM | POA: Diagnosis not present

## 2022-04-02 DIAGNOSIS — K5090 Crohn's disease, unspecified, without complications: Secondary | ICD-10-CM | POA: Diagnosis not present

## 2022-04-02 DIAGNOSIS — Z0000 Encounter for general adult medical examination without abnormal findings: Secondary | ICD-10-CM | POA: Diagnosis not present

## 2022-04-02 DIAGNOSIS — N1831 Chronic kidney disease, stage 3a: Secondary | ICD-10-CM | POA: Diagnosis not present

## 2022-04-02 DIAGNOSIS — I129 Hypertensive chronic kidney disease with stage 1 through stage 4 chronic kidney disease, or unspecified chronic kidney disease: Secondary | ICD-10-CM | POA: Diagnosis not present

## 2022-04-02 DIAGNOSIS — N3000 Acute cystitis without hematuria: Secondary | ICD-10-CM | POA: Diagnosis not present

## 2022-04-02 DIAGNOSIS — Z6823 Body mass index (BMI) 23.0-23.9, adult: Secondary | ICD-10-CM | POA: Diagnosis not present

## 2022-04-09 DIAGNOSIS — N390 Urinary tract infection, site not specified: Secondary | ICD-10-CM | POA: Diagnosis not present

## 2022-04-19 DIAGNOSIS — Z1231 Encounter for screening mammogram for malignant neoplasm of breast: Secondary | ICD-10-CM | POA: Diagnosis not present

## 2022-05-04 ENCOUNTER — Ambulatory Visit: Payer: Medicare Other | Admitting: Family Medicine

## 2022-05-05 DIAGNOSIS — M1712 Unilateral primary osteoarthritis, left knee: Secondary | ICD-10-CM | POA: Diagnosis not present

## 2022-05-05 DIAGNOSIS — M79672 Pain in left foot: Secondary | ICD-10-CM | POA: Diagnosis not present

## 2022-05-11 DIAGNOSIS — M79672 Pain in left foot: Secondary | ICD-10-CM | POA: Diagnosis not present

## 2022-07-22 ENCOUNTER — Ambulatory Visit: Payer: Medicare Other

## 2022-08-05 ENCOUNTER — Ambulatory Visit (INDEPENDENT_AMBULATORY_CARE_PROVIDER_SITE_OTHER): Payer: Medicare Other

## 2022-08-05 ENCOUNTER — Other Ambulatory Visit: Payer: Self-pay | Admitting: Family Medicine

## 2022-08-05 DIAGNOSIS — Z0000 Encounter for general adult medical examination without abnormal findings: Secondary | ICD-10-CM | POA: Diagnosis not present

## 2022-08-05 NOTE — Progress Notes (Signed)
Virtual Visit via Telephone Note  I connected with  Christine Newman on 08/05/22 at  2:30 PM EDT by telephone and verified that I am speaking with the correct person using two identifiers.  Location: Patient: home Provider: [facility] Persons participating in the virtual visit: patient/Nurse Health Advisor   I discussed the limitations, risks, security and privacy concerns of performing an evaluation and management service by telephone and the availability of in person appointments. The patient expressed understanding and agreed to proceed.  Interactive audio and video telecommunications were attempted between this nurse and patient, however failed, due to patient having technical difficulties OR patient did not have access to video capability.  We continued and completed visit with audio only.  Some vital signs may be absent or patient reported.   Dionisio David, LPN  Subjective:   Christine Newman is a 81 y.o. female who presents for Medicare Annual (Subsequent) preventive examination.  Review of Systems     Cardiac Risk Factors include: advanced age (>52mn, >>57women);hypertension     Objective:    There were no vitals filed for this visit. There is no height or weight on file to calculate BMI.     08/05/2022    2:35 PM 09/04/2018    7:51 AM 08/04/2018    1:09 PM 06/14/2018    7:33 AM 02/13/2018    1:17 PM 02/13/2018    8:11 AM 01/23/2018    6:35 AM  Advanced Directives  Does Patient Have a Medical Advance Directive? No No No No No No No  Would patient like information on creating a medical advance directive? No - Patient declined No - Patient declined No - Patient declined Yes (MAU/Ambulatory/Procedural Areas - Information given) No - Patient declined No - Patient declined No - Patient declined    Current Medications (verified) Outpatient Encounter Medications as of 08/05/2022  Medication Sig   amLODipine (NORVASC) 10 MG tablet Take 1 tablet (10 mg total) by mouth at  bedtime.   cholecalciferol (VITAMIN D) 1000 units tablet Take 1,000 Units by mouth daily.   folic acid (FOLVITE) 4417MCG tablet Take 400 mcg by mouth daily.   Multiple Vitamins-Minerals (MULTIVITAMIN WITH MINERALS) tablet Take 1 tablet by mouth daily.   Potassium Citrate (UROCIT-K 15) 15 MEQ (1620 MG) TBCR Take 1 tablet by mouth 2 (two) times daily.   vitamin B-12 (CYANOCOBALAMIN) 1000 MCG tablet Take 1,000 mcg by mouth daily.   cloNIDine (CATAPRES) 0.1 MG tablet Take 1 tablet (0.1 mg total) by mouth 2 (two) times daily. (Patient not taking: Reported on 08/05/2022)   No facility-administered encounter medications on file as of 08/05/2022.    Allergies (verified) Metoprolol and No known allergies   History: Past Medical History:  Diagnosis Date   Anal polyp 2005   condyloma acuminatum   Anemia    CKD (chronic kidney disease)    Cr 1.9 -09/2014   Colonic mass    Diverticulitis    with abscess   History of kidney stones    Hypertension    Past Surgical History:  Procedure Laterality Date   anal polypectomy  01/02/2004   Dr.Rosenbower- anal polyp removed. bx= condyloma acuminatum   CATARACT EXTRACTION W/PHACO Left 08/10/2018   Procedure: CATARACT EXTRACTION PHACO AND INTRAOCULAR LENS PLACEMENT LEFT EYE;  Surgeon: HTonny Branch MD;  Location: [facility];  Service: Ophthalmology;  Laterality: Left;  left   CATARACT EXTRACTION W/PHACO Right 09/04/2018   Procedure: CATARACT EXTRACTION PHACO AND INTRAOCULAR LENS PLACEMENT (IOC);  Surgeon: Tonny Branch, MD;  Location: [facility];  Service: Ophthalmology;  Laterality: Right;  CDE: 11.16   COLONOSCOPY  04/08/2003   Dr.Mann- small nonbleeding internal and external hemorrhoids small polypoid mass at anal verge, normal appearing L colon, transverse colon, R colon including cecum. stenotic ileocecal valve. bx of maa= benign rectal type mucosa with features of mucosal prolapse syndrome   COLONOSCOPY N/A 04/02/2015   Dr.Rourk- colonic diverticulosis, no  evidence of colonic neoplasm. abnormal ileocecal valve with ulceration bx= ulcerated/eroded benign ileocecal valve mucosa   COLONOSCOPY N/A 06/14/2018   scattered diverticula in sigmoid and descending colon. Distal 10 cm of neoterminal ileum appears normal. Appears to have surgical remission   FLEXIBLE SIGMOIDOSCOPY  11/06/2003   Dr.Mann- small nodular mass at anal verge, bx done. early L side diverticula. bx= benign rectal mucosa with features of mucosal prolapse syndrome. no adenomatous changes or evidence of malignancy identified.    IR NEPHROSTOMY PLACEMENT LEFT  01/05/2018   KIDNEY STONE SURGERY     LAPAROSCOPY N/A 11/02/2017   Procedure: LAPAROSCOPIC ILEOCECECTOMY;  Surgeon: Leighton Ruff, MD;  Location: [facility];  Service: General;  Laterality: N/A;   NEPHROLITHOTOMY Left 02/13/2018   Procedure: NEPHROLITHOTOMY PERCUTANEOUS;  Surgeon: Cleon Gustin, MD;  Location: [facility];  Service: Urology;  Laterality: Left;   Family History  Problem Relation Age of Onset   Hypertension Mother    Heart disease Father    Cancer Sister        breast   Colon cancer Neg Hx    Colon polyps Neg Hx    Social History   Socioeconomic History   Marital status: Widowed    Spouse name: Not on file   Number of children: Not on file   Years of education: Not on file   Highest education level: Not on file  Occupational History   Not on file  Tobacco Use   Smoking status: Former    Packs/day: 0.25    Years: 5.00    Total pack years: 1.25    Types: Cigarettes    Quit date: 01/18/1962    Years since quitting: 60.5   Smokeless tobacco: Never   Tobacco comments:    1 pack cigarettes per week ; quit in her early 88s   Vaping Use   Vaping Use: Never used  Substance and Sexual Activity   Alcohol use: No   Drug use: Never   Sexual activity: Not Currently    Birth control/protection: Post-menopausal  Other Topics Concern   Not on file  Social History Narrative   Not on file   Social Determinants of  Health   Financial Resource Strain: Low Risk  (08/05/2022)   Overall Financial Resource Strain (CARDIA)    Difficulty of Paying Living Expenses: Not hard at all  Food Insecurity: No Food Insecurity (08/05/2022)   Hunger Vital Sign    Worried About Running Out of Food in the Last Year: Never true    [place] in the Last Year: Never true  Transportation Needs: No Transportation Needs (08/05/2022)   PRAPARE - Hydrologist (Medical): No    Lack of Transportation (Non-Medical): No  Physical Activity: Sufficiently Active (08/05/2022)   Exercise Vital Sign    Days of Exercise per Week: 5 days    Minutes of Exercise per Session: 50 min  Stress: No Stress Concern Present (08/05/2022)   [place]    Feeling  of Stress : Not at all  Social Connections: Moderately Isolated (08/05/2022)   Social Connection and Isolation Panel [NHANES]    Frequency of Communication with Friends and Family: More than three times a week    Frequency of Social Gatherings with Friends and Family: More than three times a week    Attends Religious Services: More than 4 times per year    Active Member of [employer] or Organizations: No    Attends Archivist Meetings: Never    Marital Status: Widowed    Tobacco Counseling Counseling given: Not Answered Tobacco comments: 1 pack cigarettes per week ; quit in her early 33s    Clinical Intake:  Pre-visit preparation completed: Yes  Pain : No/denies pain     Nutritional Risks: None Diabetes: No  How often do you need to have someone help you when you read instructions, pamphlets, or other written materials from your doctor or pharmacy?: 1 - Never  Diabetic?no Interpreter Needed?: No  Information entered by :: Kirke Shaggy, LPN   Activities of Daily Living    08/05/2022    2:36 PM  In your present state of health, do you have any difficulty  performing the following activities:  Hearing? 0  Vision? 0  Difficulty concentrating or making decisions? 0  Walking or climbing stairs? 0  Dressing or bathing? 0  Doing errands, shopping? 0  Preparing Food and eating ? N  Using the Toilet? N  In the past six months, have you accidently leaked urine? N  Do you have problems with loss of bowel control? N  Managing your Medications? N  Managing your Finances? N  Housekeeping or managing your Housekeeping? N    Patient Care Team: Coral Spikes, DO as PCP - General (Family Medicine) Gala Romney Cristopher Estimable, MD as Consulting Physician (Gastroenterology)  Indicate any recent Medical Services you may have received from other than [facility] providers in the past year (date may be approximate).     Assessment:   This is a routine wellness examination for [place].  Hearing/Vision screen Hearing Screening - Comments:: Wears aids Vision Screening - Comments:: Readers- Dr. Jorja Loa  Dietary issues and exercise activities discussed: Current Exercise Habits: Home exercise routine, Type of exercise: walking, Time (Minutes): 50, Frequency (Times/Week): 5, Weekly Exercise (Minutes/Week): 250, Intensity: Mild   Goals Addressed             This Visit's Progress    DIET - EAT MORE FRUITS AND VEGETABLES         Depression Screen    08/05/2022    2:33 PM 02/02/2022    9:19 AM 07/31/2021    8:27 AM 02/17/2021    8:45 AM 08/20/2020    9:04 AM 02/18/2020    8:26 AM 09/26/2017   10:51 AM  PHQ 2/9 Scores  PHQ - 2 Score 0 0 0 0 0 0 2  PHQ- 9 Score 0      5    Fall Risk    08/05/2022    2:36 PM 02/02/2022    9:18 AM 07/31/2021    8:26 AM 02/17/2021    8:45 AM 08/20/2020    9:04 AM  Fall Risk   Falls in the past year? 0 0 0 0 0  Number falls in past yr: 0 0 0    Injury with Fall? 0 0 0    Risk for fall due to : No Fall Risks No Fall Risks No Fall Risks No Fall Risks  Follow up Falls prevention discussed;Falls evaluation completed Falls evaluation  completed Falls evaluation completed Falls evaluation completed Falls evaluation completed    FALL RISK PREVENTION PERTAINING TO THE HOME:  Any stairs in or around the home? No  If so, are there any without handrails? No  Home free of loose throw rugs in walkways, pet beds, electrical cords, etc? Yes  Adequate lighting in your home to reduce risk of falls? Yes   ASSISTIVE DEVICES UTILIZED TO PREVENT FALLS:  Life alert? No  Use of a cane, walker or w/c? No  Grab bars in the bathroom? No  Shower chair or bench in shower? No  Elevated toilet seat or a handicapped toilet? No    Cognitive Function:        08/05/2022    2:37 PM  6CIT Screen  What Year? 0 points  What month? 0 points  What time? 0 points  Count back from 20 0 points  Months in reverse 0 points  Repeat phrase 0 points  Total Score 0 points    Immunizations Immunization History  Administered Date(s) Administered   Fluad Quad(high Dose 65+) 08/20/2020, 07/31/2021   Influenza,inj,Quad PF,6+ Mos 08/24/2018, 08/20/2019   Influenza-Unspecified 07/31/2013, 09/11/2015, 08/11/2016, 07/25/2017   Moderna Covid-19 Vaccine Bivalent Booster 76yr & up 09/19/2020   Moderna Sars-Covid-2 Vaccination 11/22/2019, 12/21/2019   Pneumococcal Conjugate-13 08/20/2019   Pneumococcal Polysaccharide-23 07/31/2021    TDAP status: Due, Education has been provided regarding the importance of this vaccine. Advised may receive this vaccine at local pharmacy or Health Dept. Aware to provide a copy of the vaccination record if obtained from local pharmacy or Health Dept. Verbalized acceptance and understanding.  Flu Vaccine status: Up to date  Pneumococcal vaccine status: Up to date  Covid-19 vaccine status: Completed vaccines  Qualifies for Shingles Vaccine? Yes   Zostavax completed No   Shingrix Completed?: No.    Education has been provided regarding the importance of this vaccine. Patient has been advised to call insurance company  to determine out of pocket expense if they have not yet received this vaccine. Advised may also receive vaccine at local pharmacy or Health Dept. Verbalized acceptance and understanding.  Screening Tests Health Maintenance  Topic Date Due   TETANUS/TDAP  Never done   Zoster Vaccines- Shingrix (1 of 2) Never done   COVID-19 Vaccine (4 - Moderna series) 01/18/2021   INFLUENZA VACCINE  05/11/2022   Medicare Annual Wellness (AWV)  09/05/2023   Pneumonia Vaccine 80 Years old  Completed   DEXA SCAN  Completed   HPV VACCINES  Aged Out    Health Maintenance  Health Maintenance Due  Topic Date Due   TETANUS/TDAP  Never done   Zoster Vaccines- Shingrix (1 of 2) Never done   COVID-19 Vaccine (4 - Moderna series) 01/18/2021   INFLUENZA VACCINE  05/11/2022    Colorectal cancer screening: No longer required.   Mammogram status: No longer required due to age.  Bone Density status: Completed 05/07/21. Results reflect: Bone density results: OSTEOPENIA. Repeat every 5 years.  Lung Cancer Screening: (Low Dose CT Chest recommended if Age 80-80years, 30 pack-year currently smoking OR have quit w/in 15years.) does not qualify.   Additional Screening:  Hepatitis C Screening: does not qualify; Completed no  Vision Screening: Recommended annual ophthalmology exams for early detection of glaucoma and other disorders of the eye. Is the patient up to date with their annual eye exam?  Yes  Who is the provider or what is  the name of the office in which the patient attends annual eye exams? Dr.Groat If pt is not established with a provider, would they like to be referred to a provider to establish care? No .   Dental Screening: Recommended annual dental exams for proper oral hygiene  Community Resource Referral / Chronic Care Management: CRR required this visit?  No   CCM required this visit?  No      Plan:     I have personally reviewed and noted the following in the patient's chart:    Medical and social history Use of alcohol, tobacco or illicit drugs  Current medications and supplements including opioid prescriptions. Patient is not currently taking opioid prescriptions. Functional ability and status Nutritional status Physical activity Advanced directives List of other physicians Hospitalizations, surgeries, and ER visits in previous 12 months Vitals Screenings to include cognitive, depression, and falls Referrals and appointments  In addition, I have reviewed and discussed with patient certain preventive protocols, quality metrics, and best practice recommendations. A written personalized care plan for preventive services as well as general preventive health recommendations were provided to patient.     Dionisio David, LPN   [id number]   Nurse Notes: none

## 2022-08-05 NOTE — Patient Instructions (Signed)
Christine Newman , Thank you for taking time to come for your Medicare Wellness Visit. I appreciate your ongoing commitment to your health goals. Please review the following plan we discussed and let me know if I can assist you in the future.   Screening recommendations/referrals: Colonoscopy: aged out Mammogram: aged out Bone Density: 04/17/21, every 5 years Recommended yearly ophthalmology/optometry visit for glaucoma screening and checkup Recommended yearly dental visit for hygiene and checkup  Vaccinations: Influenza vaccine: 07/31/21 Pneumococcal vaccine: 07/31/21 Tdap vaccine: n/d Shingles vaccine: n/d   Covid-19:11/22/19, 12/21/19, 09/19/20  Advanced directives: no  Conditions/risks identified: none  Next appointment: Follow up in one year for your annual wellness visit    Preventive Care 80 Years and Older, Female Preventive care refers to lifestyle choices and visits with your health care provider that can promote health and wellness. What does preventive care include? A yearly physical exam. This is also called an annual well check. Dental exams once or twice a year. Routine eye exams. Ask your health care provider how often you should have your eyes checked. Personal lifestyle choices, including: Daily care of your teeth and gums. Regular physical activity. Eating a healthy diet. Avoiding tobacco and drug use. Limiting alcohol use. Practicing safe sex. Taking low-dose aspirin every day. Taking vitamin and mineral supplements as recommended by your health care provider. What happens during an annual well check? The services and screenings done by your health care provider during your annual well check will depend on your age, overall health, lifestyle risk factors, and family history of disease. Counseling  Your health care provider may ask you questions about your: Alcohol use. Tobacco use. Drug use. Emotional well-being. Home and relationship well-being. Sexual  activity. Eating habits. History of falls. Memory and ability to understand (cognition). Work and work Statistician. Reproductive health. Screening  You may have the following tests or measurements: Height, weight, and BMI. Blood pressure. Lipid and cholesterol levels. These may be checked every 5 years, or more frequently if you are over 26 years old. Skin check. Lung cancer screening. You may have this screening every year starting at age 80 if you have a 30-pack-year history of smoking and currently smoke or have quit within the past 15 years. Fecal occult blood test (FOBT) of the stool. You may have this test every year starting at age 80 Flexible sigmoidoscopy or colonoscopy. You may have a sigmoidoscopy every 5 years or a colonoscopy every 10 years starting at age 80. Hepatitis C blood test. Hepatitis B blood test. Sexually transmitted disease (STD) testing. Diabetes screening. This is done by checking your blood sugar (glucose) after you have not eaten for a while (fasting). You may have this done every 1-3 years. Bone density scan. This is done to screen for osteoporosis. You may have this done starting at age 80. Mammogram. This may be done every 1-2 years. Talk to your health care provider about how often you should have regular mammograms. Talk with your health care provider about your test results, treatment options, and if necessary, the need for more tests. Vaccines  Your health care provider may recommend certain vaccines, such as: Influenza vaccine. This is recommended every year. Tetanus, diphtheria, and acellular pertussis (Tdap, Td) vaccine. You may need a Td booster every 10 years. Zoster vaccine. You may need this after age 80. Pneumococcal 13-valent conjugate (PCV13) vaccine. One dose is recommended after age 80. Pneumococcal polysaccharide (PPSV23) vaccine. One dose is recommended after age 80. Talk to your health care  provider about which screenings and vaccines  you need and how often you need them. This information is not intended to replace advice given to you by your health care provider. Make sure you discuss any questions you have with your health care provider. Document Released: 10/24/2015 Document Revised: 06/16/2016 Document Reviewed: 07/29/2015 Elsevier Interactive Patient Education  2017 [place] Prevention in the Home Falls can cause injuries. They can happen to people of all ages. There are many things you can do to make your home safe and to help prevent falls. What can I do on the outside of my home? Regularly fix the edges of walkways and driveways and fix any cracks. Remove anything that might make you trip as you walk through a door, such as a raised step or threshold. Trim any bushes or trees on the path to your home. Use bright outdoor lighting. Clear any walking paths of anything that might make someone trip, such as rocks or tools. Regularly check to see if handrails are loose or broken. Make sure that both sides of any steps have handrails. Any raised decks and porches should have guardrails on the edges. Have any leaves, snow, or ice cleared regularly. Use sand or salt on walking paths during winter. Clean up any spills in your garage right away. This includes oil or grease spills. What can I do in the bathroom? Use night lights. Install grab bars by the toilet and in the tub and shower. Do not use towel bars as grab bars. Use non-skid mats or decals in the tub or shower. If you need to sit down in the shower, use a plastic, non-slip stool. Keep the floor dry. Clean up any water that spills on the floor as soon as it happens. Remove soap buildup in the tub or shower regularly. Attach bath mats securely with double-sided non-slip rug tape. Do not have throw rugs and other things on the floor that can make you trip. What can I do in the bedroom? Use night lights. Make sure that you have a light by your bed that  is easy to reach. Do not use any sheets or blankets that are too big for your bed. They should not hang down onto the floor. Have a firm chair that has side arms. You can use this for support while you get dressed. Do not have throw rugs and other things on the floor that can make you trip. What can I do in the kitchen? Clean up any spills right away. Avoid walking on wet floors. Keep items that you use a lot in easy-to-reach places. If you need to reach something above you, use a strong step stool that has a grab bar. Keep electrical cords out of the way. Do not use floor polish or wax that makes floors slippery. If you must use wax, use non-skid floor wax. Do not have throw rugs and other things on the floor that can make you trip. What can I do with my stairs? Do not leave any items on the stairs. Make sure that there are handrails on both sides of the stairs and use them. Fix handrails that are broken or loose. Make sure that handrails are as long as the stairways. Check any carpeting to make sure that it is firmly attached to the stairs. Fix any carpet that is loose or worn. Avoid having throw rugs at the top or bottom of the stairs. If you do have throw rugs, attach them to the  floor with carpet tape. Make sure that you have a light switch at the top of the stairs and the bottom of the stairs. If you do not have them, ask someone to add them for you. What else can I do to help prevent falls? Wear shoes that: Do not have high heels. Have rubber bottoms. Are comfortable and fit you well. Are closed at the toe. Do not wear sandals. If you use a stepladder: Make sure that it is fully opened. Do not climb a closed stepladder. Make sure that both sides of the stepladder are locked into place. Ask someone to hold it for you, if possible. Clearly mark and make sure that you can see: Any grab bars or handrails. First and last steps. Where the edge of each step is. Use tools that help you  move around (mobility aids) if they are needed. These include: Canes. Walkers. Scooters. Crutches. Turn on the lights when you go into a dark area. Replace any light bulbs as soon as they burn out. Set up your furniture so you have a clear path. Avoid moving your furniture around. If any of your floors are uneven, fix them. If there are any pets around you, be aware of where they are. Review your medicines with your doctor. Some medicines can make you feel dizzy. This can increase your chance of falling. Ask your doctor what other things that you can do to help prevent falls. This information is not intended to replace advice given to you by your health care provider. Make sure you discuss any questions you have with your health care provider. Document Released: 07/24/2009 Document Revised: 03/04/2016 Document Reviewed: 11/01/2014 Elsevier Interactive Patient Education  2017 [employer].

## 2022-08-08 IMAGING — DX DG ABDOMEN 1V
1 series · 1 of 1 positions shown · non-contrast
Comparison: 01/08/2020.  Ultrasound 07/03/2018.  CT 04/05/2018.

CLINICAL DATA: Fall kidney stone.

EXAM:
ABDOMEN - 1 VIEW

[abdomen kub]
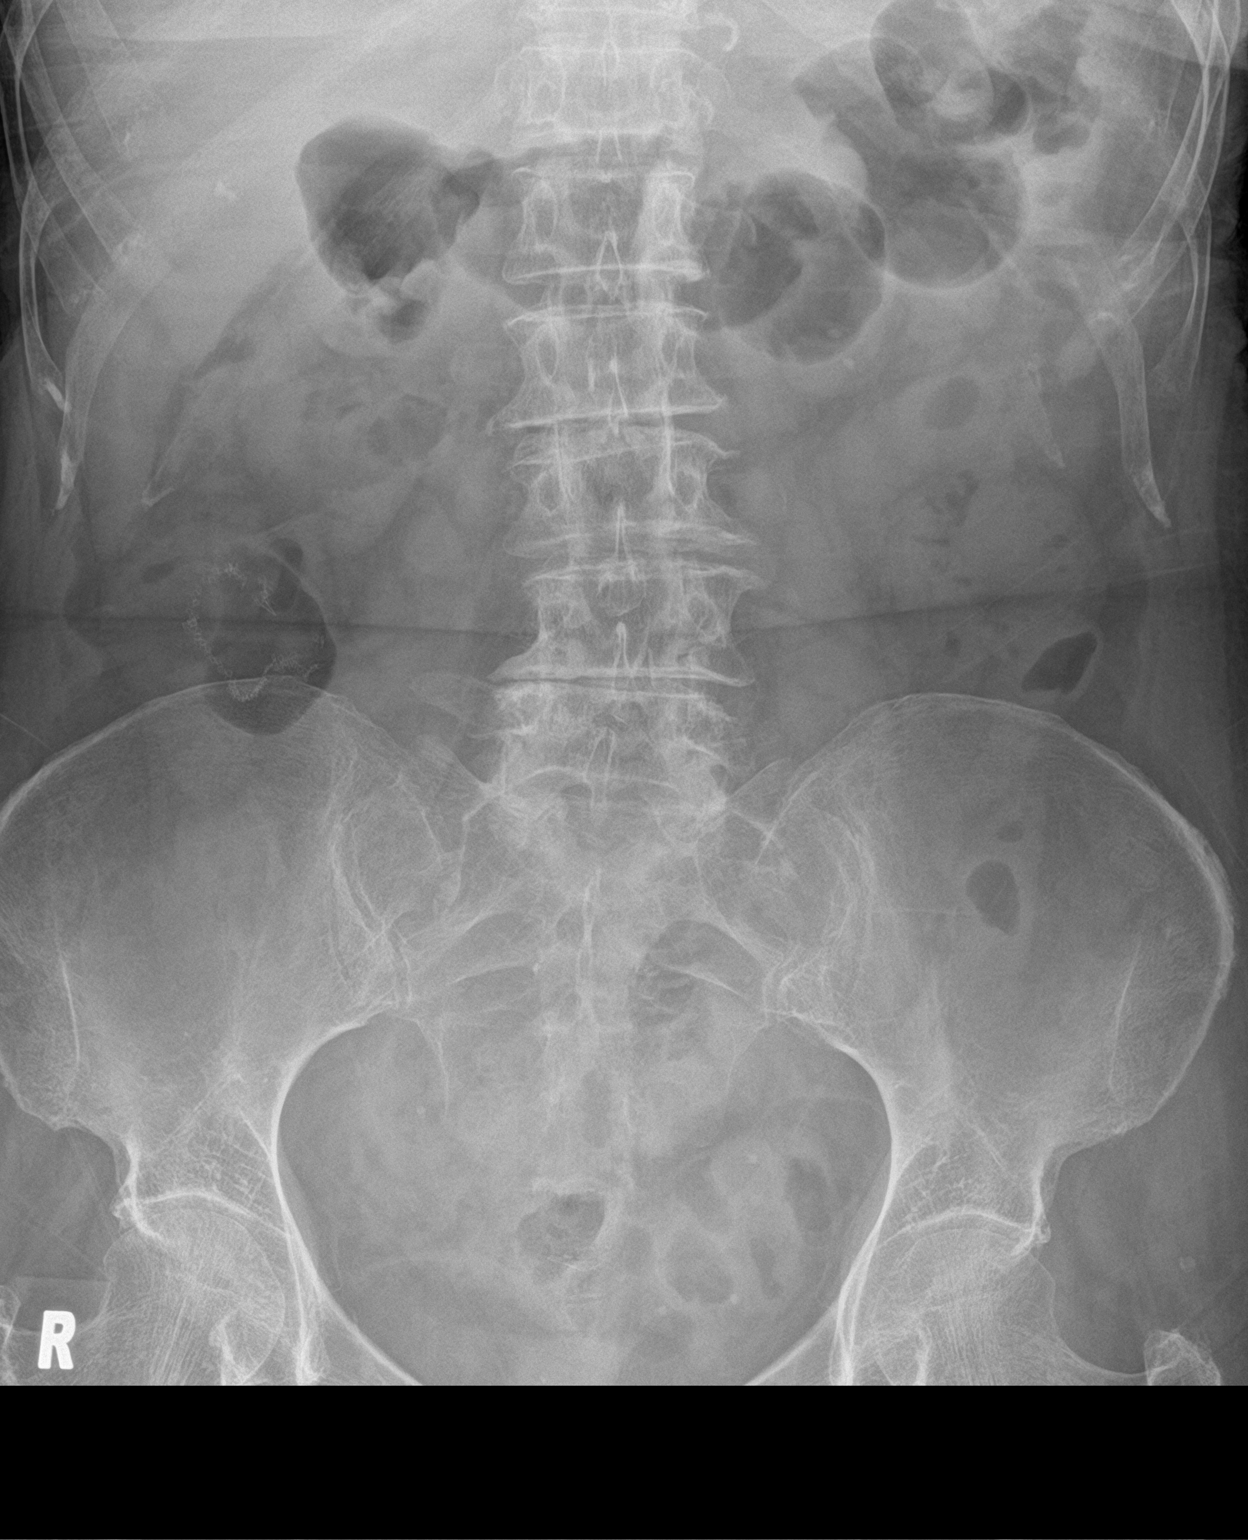

[1 of 1 positions shown; findings below may reference images not displayed]

FINDINGS: Surgical sutures noted over the right abdomen. No bowel distention.
No definite right renal stone identified. Two tiny left renal stones
cannot be excluded. Pelvic calcifications again noted in stable
position, most likely phleboliths. Degenerative change lumbar spine.
IMPRESSION: 1.  No definite right renal stone identified.

2.  Two tiny left renal stones cannot be excluded.

## 2022-08-11 ENCOUNTER — Ambulatory Visit (HOSPITAL_COMMUNITY)
Admission: RE | Admit: 2022-08-11 | Discharge: 2022-08-11 | Disposition: A | Discharge status: Home / Self Care | Payer: Medicare Other | Source: Ambulatory Visit | Attending: Urology | Admitting: Urology

## 2022-08-11 DIAGNOSIS — I878 Other specified disorders of veins: Secondary | ICD-10-CM | POA: Diagnosis not present

## 2022-08-11 DIAGNOSIS — N200 Calculus of kidney: Secondary | ICD-10-CM | POA: Diagnosis not present

## 2022-08-11 DIAGNOSIS — M47816 Spondylosis without myelopathy or radiculopathy, lumbar region: Secondary | ICD-10-CM | POA: Diagnosis not present

## 2022-08-25 DIAGNOSIS — M25572 Pain in left ankle and joints of left foot: Secondary | ICD-10-CM | POA: Diagnosis not present

## 2022-08-27 ENCOUNTER — Ambulatory Visit (INDEPENDENT_AMBULATORY_CARE_PROVIDER_SITE_OTHER): Payer: Medicare Other | Admitting: Urology

## 2022-08-27 ENCOUNTER — Encounter: Payer: Self-pay | Admitting: Urology

## 2022-08-27 VITALS — BP 173/73 | HR 59

## 2022-08-27 DIAGNOSIS — N200 Calculus of kidney: Secondary | ICD-10-CM

## 2022-08-27 LAB — URINALYSIS, ROUTINE W REFLEX MICROSCOPIC
Bilirubin, UA: NEGATIVE
Glucose, UA: NEGATIVE
Ketones, UA: NEGATIVE
Nitrite, UA: NEGATIVE
Specific Gravity, UA: 1.02 (ref 1.005–1.030)
Urobilinogen, Ur: 0.2 mg/dL (ref 0.2–1.0)
pH, UA: 5.5 (ref 5.0–7.5)

## 2022-08-27 LAB — MICROSCOPIC EXAMINATION: RBC, Urine: NONE SEEN /hpf (ref 0–2)

## 2022-08-27 MED ORDER — POTASSIUM CITRATE ER 15 MEQ (1620 MG) PO TBCR: 1.0000 | EXTENDED_RELEASE_TABLET | Freq: Two times a day (BID) | ORAL | 3 refills | Status: DC

## 2022-08-27 NOTE — Patient Instructions (Signed)

## 2022-08-27 NOTE — Progress Notes (Signed)
08/27/2022 10:07 AM   Christine Newman [birth year] [id number]  Referring provider: Coral Spikes, DO [place],  [place] [id number]  Followup nephrolithiasis   HPI: [name] is a 81yo here for followup for nephrolithiasis. She had an episode of left flank pain 6 months ago which then resolved. KUB from 11/1 shows increased in size of the left renal calculi. She has 2 37m calculi.    PMH: Past Medical History:  Diagnosis Date   Anal polyp 2005   condyloma acuminatum   Anemia    CKD (chronic kidney disease)    Cr 1.9 -09/2014   Colonic mass    Diverticulitis    with abscess   History of kidney stones    Hypertension     Surgical History: Past Surgical History:  Procedure Laterality Date   anal polypectomy  01/02/2004   Dr.Rosenbower- anal polyp removed. bx= condyloma acuminatum   CATARACT EXTRACTION W/PHACO Left 08/10/2018   Procedure: CATARACT EXTRACTION PHACO AND INTRAOCULAR LENS PLACEMENT LEFT EYE;  Surgeon: HTonny Branch MD;  Location: [facility];  Service: Ophthalmology;  Laterality: Left;  left   CATARACT EXTRACTION W/PHACO Right 09/04/2018   Procedure: CATARACT EXTRACTION PHACO AND INTRAOCULAR LENS PLACEMENT (IOC);  Surgeon: HTonny Branch MD;  Location: [facility];  Service: Ophthalmology;  Laterality: Right;  CDE: 11.16   COLONOSCOPY  04/08/2003   Dr.Mann- small nonbleeding internal and external hemorrhoids small polypoid mass at anal verge, normal appearing L colon, transverse colon, R colon including cecum. stenotic ileocecal valve. bx of maa= benign rectal type mucosa with features of mucosal prolapse syndrome   COLONOSCOPY N/A 04/02/2015   Dr.Rourk- colonic diverticulosis, no evidence of colonic neoplasm. abnormal ileocecal valve with ulceration bx= ulcerated/eroded benign ileocecal valve mucosa   COLONOSCOPY N/A 06/14/2018   scattered diverticula in sigmoid and descending colon. Distal 10 cm of neoterminal ileum appears normal. Appears to have surgical  remission   FLEXIBLE SIGMOIDOSCOPY  11/06/2003   Dr.Mann- small nodular mass at anal verge, bx done. early L side diverticula. bx= benign rectal mucosa with features of mucosal prolapse syndrome. no adenomatous changes or evidence of malignancy identified.    IR NEPHROSTOMY PLACEMENT LEFT  01/05/2018   KIDNEY STONE SURGERY     LAPAROSCOPY N/A 11/02/2017   Procedure: LAPAROSCOPIC ILEOCECECTOMY;  Surgeon: TLeighton Ruff MD;  Location: [facility];  Service: General;  Laterality: N/A;   NEPHROLITHOTOMY Left 02/13/2018   Procedure: NEPHROLITHOTOMY PERCUTANEOUS;  Surgeon: MCleon Gustin MD;  Location: [facility];  Service: Urology;  Laterality: Left;    Home Medications:  Allergies as of 08/27/2022       Reactions   Metoprolol    Bradycardia   No Known Allergies         Medication List        Accurate as of August 27, 2022 10:07 AM. If you have any questions, ask your nurse or doctor.          amLODipine 10 MG tablet Commonly known as: NORVASC TAKE (1) TABLET BY MOUTH AT BEDTIME.   cholecalciferol 1000 units tablet Commonly known as: VITAMIN D Take 1,000 Units by mouth daily.   cloNIDine 0.1 MG tablet Commonly known as: CATAPRES Take 1 tablet (0.1 mg total) by mouth 2 (two) times daily.   cyanocobalamin 1000 MCG tablet Commonly known as: VITAMIN B12 Take 1,000 mcg by mouth daily.   folic acid 4932MCG tablet Commonly known as: FOLVITE Take 400 mcg by mouth daily.  multivitamin with minerals tablet Take 1 tablet by mouth daily.   Potassium Citrate 15 MEQ (1620 MG) Tbcr Commonly known as: Urocit-K 15 Take 1 tablet by mouth 2 (two) times daily.   spironolactone 25 MG tablet Commonly known as: ALDACTONE Take by mouth.        Allergies:  Allergies  Allergen Reactions   Metoprolol     Bradycardia   No Known Allergies     Family History: Family History  Problem Relation Age of Onset   Hypertension Mother    Heart disease Father    Cancer Sister         breast   Colon cancer Neg Hx    Colon polyps Neg Hx     Social History:  reports that she quit smoking about 60 years ago. Her smoking use included cigarettes. She has a 1.25 pack-year smoking history. She has never used smokeless tobacco. She reports that she does not drink alcohol and does not use drugs.  ROS: All other review of systems were reviewed and are negative except what is noted above in HPI  Physical Exam: BP (!) 173/73   Pulse (!) 59   LMP  (LMP Unknown)   Constitutional:  Alert and oriented, No acute distress. HEENT: [place] AT, moist mucus membranes.  Trachea midline, no masses. Cardiovascular: No clubbing, cyanosis, or edema. Respiratory: Normal respiratory effort, no increased work of breathing. GI: Abdomen is soft, nontender, nondistended, no abdominal masses GU: No CVA tenderness.  Lymph: No cervical or inguinal lymphadenopathy. Skin: No rashes, bruises or suspicious lesions. Neurologic: Grossly intact, no focal deficits, moving all 4 extremities. Psychiatric: Normal mood and affect.  Laboratory Data: Lab Results  Component Value Date   WBC 6.1 07/31/2021   HGB 14.8 07/31/2021   HCT 45.6 07/31/2021   MCV 95 07/31/2021   PLT 245 07/31/2021    Lab Results  Component Value Date   CREATININE 1.60 (H) 07/31/2021    No results found for: "PSA"  No results found for: "TESTOSTERONE"  Lab Results  Component Value Date   HGBA1C 5.4 07/31/2021    Urinalysis    Component Value Date/Time   COLORURINE YELLOW 01/05/2018 1404   APPEARANCEUR Clear 02/11/2021 0951   LABSPEC 1.018 01/05/2018 1404   PHURINE 5.0 01/05/2018 1404   GLUCOSEU Negative 02/11/2021 0951   HGBUR MODERATE (A) 01/05/2018 1404   BILIRUBINUR Negative 02/11/2021 0951   KETONESUR NEGATIVE 01/05/2018 1404   PROTEINUR Trace (A) 02/11/2021 0951   PROTEINUR NEGATIVE 01/05/2018 1404   UROBILINOGEN 0.2 02/11/2020 1530   UROBILINOGEN 0.2 01/13/2015 1908   NITRITE Negative 02/11/2021 0951    NITRITE NEGATIVE 01/05/2018 1404   LEUKOCYTESUR 1+ (A) 02/11/2021 0951    Lab Results  Component Value Date   LABMICR See below: 02/11/2021   WBCUA 11-30 (A) 02/11/2021   LABEPIT 0-10 02/11/2021   MUCUS Present 02/11/2021   BACTERIA Many (A) 02/11/2021    Pertinent Imaging: KUB 08/11/2022: Images reviewed and discussed with the patient  Results for orders placed during the hospital encounter of 08/11/22  Abdomen 1 view (KUB)  Narrative CLINICAL DATA:  Nephrolithiasis, follow-up  EXAM: ABDOMEN - 1 VIEW  COMPARISON:  08/20/2021  FINDINGS: Two calculi project over the LEFT renal pelvis, each measuring 4 mm diameter.  Few pelvic phleboliths.  No additional urinary tract calcifications.  Bowel gas pattern normal.  Bones demineralized with degenerative changes of the lumbar spine.  IMPRESSION: Two 4 mm diameter calculi project over LEFT renal pelvis.  Electronically Signed By: Lavonia Dana M.D. On: 08/11/2022 13:48  No results found for this or any previous visit.  No results found for this or any previous visit.  No results found for this or any previous visit.  Results for orders placed during the hospital encounter of 07/03/18  US RENAL  Narrative CLINICAL DATA:  81 year old female with right kidney stone. Six-month follow-up.  EXAM: RENAL / URINARY TRACT ULTRASOUND COMPLETE  COMPARISON:  04/19/2018 renal sonogram. 04/05/2018 CT. Plain film of the abdomen same date dictated separately.  FINDINGS: Right Kidney:  Length: 9.8 cm. Slight increased renal parenchyma echogenicity. No hydronephrosis. 6 mm nonobstructing stone mid aspect. No right renal mass identified.  Left Kidney:  Length: 7.8 cm. Atrophic and difficult to adequately assess. No obvious mass or hydronephrosis.  Bladder:  Not evaluated as empty.  IMPRESSION: 1. 6 mm nonobstructing right renal calculi. No right-sided hydronephrosis. Mild increased renal parenchyma echogenicity  of the right kidney may represent changes of mild medical renal disease. 2. Atrophic left kidney difficult to visualize. No obvious left-sided hydronephrosis or mass.   Electronically Signed By: Genia Del M.D. On: 07/03/2018 16:35  No valid procedures specified. No results found for this or any previous visit.  Results for orders placed during the hospital encounter of 02/13/18  CT RENAL STONE STUDY  Narrative CLINICAL DATA:  Left-sided pain. Reported left percutaneous nephrolithotomy 1 day prior.  EXAM: CT ABDOMEN AND PELVIS WITHOUT CONTRAST  TECHNIQUE: Multidetector CT imaging of the abdomen and pelvis was performed following the standard protocol without IV contrast.  COMPARISON:  02/08/2018 CT abdomen/pelvis.  FINDINGS: Lower chest: Subsegmental dependent bibasilar atelectasis. Subcentimeter calcified medial left lower lobe granuloma. Coronary atherosclerosis.  Hepatobiliary: Normal liver size. Simple 1.0 cm posterior right liver lobe cyst. Subcentimeter hypodense liver lesions near the gallbladder fossa (series 2/image 21), too small to characterize, stable, probably benign. No new liver lesions. Mildly distended gallbladder. No radiopaque cholelithiasis. No gallbladder wall thickening. No pericholecystic fluid. No biliary ductal dilatation.  Pancreas: Normal, with no mass or duct dilation.  Spleen: Normal size. No mass.  Adrenals/Urinary Tract: Normal adrenals. Asymmetric severe left renal parenchymal atrophy. Large bore left percutaneous nephrostomy tube terminates in the left renal pelvis. Well-positioned left nephroureteral stent with proximal pigtail portion in the left renal pelvis and distal pigtail portion in the bladder. Expected gas scattered in the left renal collecting system. A few mildly dilated calices in the left renal collecting system. Left renal pelvis collapse. Nonspecific haziness of the central left renal sinus fat, presumably  inflammatory or due to postprocedure change. No residual stone fragments in the left renal collecting system or left ureter. Mild ill-defined fluid and haziness in the left perinephric retroperitoneum. No contour deforming left renal mass.  Nonobstructing 4 mm interpolar and 2 mm lower right renal stones. Small simple parapelvic right renal cysts. No right hydronephrosis. Normal caliber right ureter, with no right ureteral stones. No contour deforming right renal mass.  Collapsed bladder. No bladder stones. Expected gas in the nondependent bladder from instrumentation.  Stomach/Bowel: Normal non-distended stomach. Stable postsurgical changes from ileocecal resection with intact appearing neo ileocolic anastomosis in the right abdomen. No small bowel wall thickening. A few mildly dilated small bowel loops in the right abdomen up to the 4.3 cm diameter without focal small bowel caliber transition. Remnant large bowel is normal caliber with no wall thickening or significant pericolonic fat stranding.  Vascular/Lymphatic: Atherosclerotic nonaneurysmal abdominal aorta. No pathologically enlarged lymph nodes in the abdomen  or pelvis.  Reproductive: Grossly normal uterus.  No adnexal mass.  Other: No pneumoperitoneum, ascites or focal fluid collection.  Musculoskeletal: No aggressive appearing focal osseous lesions. Moderate lumbar spondylosis.  IMPRESSION: 1. Expected postprocedural change status post left percutaneous nephrolithotomy. No residual stone fragments in the left renal collecting system or left ureter. Well-positioned left percutaneous nephrostomy tube and left nephroureteral stent. A few dilated calices in the left renal collecting system with nondilated left renal pelvis. No focal fluid collections. 2. Nonobstructing right nephrolithiasis. 3. Mildly dilated right abdominal small bowel loops, favor mild post procedural ileus. 4.  Aortic Atherosclerosis  (ICD10-I70.0).   Electronically Signed By: Ilona Sorrel M.D. On: 02/14/2018 13:16   Assessment & Plan:    1. Calculus, renal -We discussed the management of kidney stones. These options include observation, ureteroscopy, shockwave lithotripsy (ESWL) and percutaneous nephrolithotomy (PCNL). We discussed which options are relevant to the patient's stone(s). We discussed the natural history of kidney stones as well as the complications of untreated stones and the impact on quality of life without treatment as well as with each of the above listed treatments. We also discussed the efficacy of each treatment in its ability to clear the stone burden. With any of these management options I discussed the signs and symptoms of infection and the need for emergent treatment should these be experienced. For each option we discussed the ability of each procedure to clear the patient of their stone burden.   For observation I described the risks which include but are not limited to silent renal damage, life-threatening infection, need for emergent surgery, failure to pass stone and pain.   For ureteroscopy I described the risks which include bleeding, infection, damage to contiguous structures, positioning injury, ureteral stricture, ureteral avulsion, ureteral injury, need for prolonged ureteral stent, inability to perform ureteroscopy, need for an interval procedure, inability to clear stone burden, stent discomfort/pain, heart attack, stroke, pulmonary embolus and the inherent risks with general anesthesia.   For shockwave lithotripsy I described the risks which include arrhythmia, kidney contusion, kidney hemorrhage, need for transfusion, pain, inability to adequately break up stone, inability to pass stone fragments, Steinstrasse, infection associated with obstructing stones, need for alternate surgical procedure, need for repeat shockwave lithotripsy, MI, CVA, PE and the inherent risks with  anesthesia/conscious sedation.   For PCNL I described the risks including positioning injury, pneumothorax, hydrothorax, need for chest tube, inability to clear stone burden, renal laceration, arterial venous fistula or malformation, need for embolization of kidney, loss of kidney or renal function, need for repeat procedure, need for prolonged nephrostomy tube, ureteral avulsion, MI, CVA, PE and the inherent risks of general anesthesia.   - The patient would like to proceed with observation. RTc 6 months with KUB - Urinalysis, Routine w reflex microscopic   No follow-ups on file.  Nicolette Bang, MD  [facility] [place]

## 2022-10-08 DIAGNOSIS — I129 Hypertensive chronic kidney disease with stage 1 through stage 4 chronic kidney disease, or unspecified chronic kidney disease: Secondary | ICD-10-CM | POA: Diagnosis not present

## 2022-10-08 DIAGNOSIS — N184 Chronic kidney disease, stage 4 (severe): Secondary | ICD-10-CM | POA: Diagnosis not present

## 2022-11-08 DIAGNOSIS — N200 Calculus of kidney: Secondary | ICD-10-CM | POA: Diagnosis not present

## 2022-11-08 DIAGNOSIS — I129 Hypertensive chronic kidney disease with stage 1 through stage 4 chronic kidney disease, or unspecified chronic kidney disease: Secondary | ICD-10-CM | POA: Diagnosis not present

## 2022-11-08 DIAGNOSIS — N183 Chronic kidney disease, stage 3 unspecified: Secondary | ICD-10-CM | POA: Diagnosis not present

## 2022-11-08 DIAGNOSIS — N390 Urinary tract infection, site not specified: Secondary | ICD-10-CM | POA: Diagnosis not present

## 2022-11-08 DIAGNOSIS — K5720 Diverticulitis of large intestine with perforation and abscess without bleeding: Secondary | ICD-10-CM | POA: Diagnosis not present

## 2022-12-29 ENCOUNTER — Ambulatory Visit
Admission: RE | Admit: 2022-12-29 | Discharge: 2022-12-29 | Disposition: A | Discharge status: Home / Self Care | Payer: Medicare Other | Source: Ambulatory Visit | Attending: Nurse Practitioner | Admitting: Nurse Practitioner

## 2022-12-29 ENCOUNTER — Other Ambulatory Visit: Payer: Self-pay

## 2022-12-29 VITALS — BP 148/72 | HR 75 | Temp 97.6°F | Resp 17

## 2022-12-29 DIAGNOSIS — S60450A Superficial foreign body of right index finger, initial encounter: Secondary | ICD-10-CM

## 2022-12-29 DIAGNOSIS — L02511 Cutaneous abscess of right hand: Secondary | ICD-10-CM | POA: Diagnosis not present

## 2022-12-29 MED ORDER — DOXYCYCLINE HYCLATE 100 MG PO CAPS: 100.0000 mg | ORAL_CAPSULE | Freq: Two times a day (BID) | ORAL | 0 refills | Status: AC

## 2022-12-29 NOTE — Discharge Instructions (Signed)
Please start soaking your right finger in warm water twice daily to help draw out infection and hopefully the thorn Take the antibiotic as prescribed to help treat infection in your finger Follow up with Hand Specialist if symptoms persist or worsen despite treatment

## 2022-12-29 NOTE — ED Triage Notes (Signed)
Pt reports was working in the yard on a rose bush and reports a thorn broke off on right index finger x2 weeks. Pt reports removed part of it but reports increased pain and swelling ever since and pt is concerned "something might still be in there."

## 2022-12-29 NOTE — ED Provider Notes (Signed)
[facility]    CSN: [id number] Arrival date & time: 12/29/22  6160      History   Chief Complaint Chief Complaint  Patient presents with   Foreign Body    finger infection - Entered by patient    HPI Christine Newman is a 82 y.o. female.   Patient presents today with concern of infection in the right index finger.  Reports 2 weeks ago, she was pruning her rose bushes and she got a thorn stuck in the tip of her finger.  Was not wearing gloves.  She promptly broke the thorn off in her finger, however thinks there is still a piece of it left.  Reports area is increasing in pain over the past couple of weeks, and is a little bit swollen.  No drainage, fever, nausea/vomiting.  No decreased range of motion.  Whenever she "bumps it" it hurts, otherwise no pain.  She is requesting I remove it today.  Patient denies allergy to antibiotic therapy.  Denies antibiotic use in the past 90 days.    Past Medical History:  Diagnosis Date   Anal polyp 2005   condyloma acuminatum   Anemia    CKD (chronic kidney disease)    Cr 1.9 -09/2014   Colonic mass    Diverticulitis    with abscess   History of kidney stones    Hypertension     Patient Active Problem List   Diagnosis Date Noted   History of kidney stones 07/31/2021   History of diverticulitis 07/31/2021   Osteoarthritis 07/31/2021   Pure hypercholesterolemia 07/31/2021   Osteopenia 05/11/2021   Crohn's disease in remission ([place]) 09/15/2018   Generalized anxiety disorder 09/27/2015   CKD (chronic kidney disease) stage 3, GFR 30-59 ml/min ([place]) 01/14/2015   Essential hypertension, benign 03/11/2013    Past Surgical History:  Procedure Laterality Date   anal polypectomy  01/02/2004   Dr.Rosenbower- anal polyp removed. bx= condyloma acuminatum   CATARACT EXTRACTION W/PHACO Left 08/10/2018   Procedure: CATARACT EXTRACTION PHACO AND INTRAOCULAR LENS PLACEMENT LEFT EYE;  Surgeon: Tonny Branch, MD;  Location: [facility];   Service: Ophthalmology;  Laterality: Left;  left   CATARACT EXTRACTION W/PHACO Right 09/04/2018   Procedure: CATARACT EXTRACTION PHACO AND INTRAOCULAR LENS PLACEMENT (IOC);  Surgeon: Tonny Branch, MD;  Location: [facility];  Service: Ophthalmology;  Laterality: Right;  CDE: 11.16   COLONOSCOPY  04/08/2003   Dr.Mann- small nonbleeding internal and external hemorrhoids small polypoid mass at anal verge, normal appearing L colon, transverse colon, R colon including cecum. stenotic ileocecal valve. bx of maa= benign rectal type mucosa with features of mucosal prolapse syndrome   COLONOSCOPY N/A 04/02/2015   Dr.Rourk- colonic diverticulosis, no evidence of colonic neoplasm. abnormal ileocecal valve with ulceration bx= ulcerated/eroded benign ileocecal valve mucosa   COLONOSCOPY N/A 06/14/2018   scattered diverticula in sigmoid and descending colon. Distal 10 cm of neoterminal ileum appears normal. Appears to have surgical remission   FLEXIBLE SIGMOIDOSCOPY  11/06/2003   Dr.Mann- small nodular mass at anal verge, bx done. early L side diverticula. bx= benign rectal mucosa with features of mucosal prolapse syndrome. no adenomatous changes or evidence of malignancy identified.    IR NEPHROSTOMY PLACEMENT LEFT  01/05/2018   KIDNEY STONE SURGERY     LAPAROSCOPY N/A 11/02/2017   Procedure: LAPAROSCOPIC ILEOCECECTOMY;  Surgeon: Leighton Ruff, MD;  Location: [facility];  Service: General;  Laterality: N/A;   NEPHROLITHOTOMY Left 02/13/2018   Procedure: NEPHROLITHOTOMY PERCUTANEOUS;  Surgeon:  McKenzie, Candee Furbish, MD;  Location: [facility];  Service: Urology;  Laterality: Left;    OB History   No obstetric history on file.      Home Medications    Prior to Admission medications   Medication Sig Start Date End Date Taking? Authorizing Provider  doxycycline (VIBRAMYCIN) 100 MG capsule Take 1 capsule (100 mg total) by mouth 2 (two) times daily for 7 days. 12/29/22 01/05/23 Yes Noemi Chapel A, NP  amLODipine (NORVASC) 10  MG tablet TAKE (1) TABLET BY MOUTH AT BEDTIME. 08/05/22   Coral Spikes, DO  cholecalciferol (VITAMIN D) 1000 units tablet Take 1,000 Units by mouth daily.    [provider]  cloNIDine (CATAPRES) 0.1 MG tablet Take 1 tablet (0.1 mg total) by mouth 2 (two) times daily. Patient not taking: Reported on 08/05/2022 02/12/22   Coral Spikes, DO  folic acid (FOLVITE) A999333 MCG tablet Take 400 mcg by mouth daily.    [provider]  Multiple Vitamins-Minerals (MULTIVITAMIN WITH MINERALS) tablet Take 1 tablet by mouth daily.    [provider]  Potassium Citrate (UROCIT-K 15) 15 MEQ (1620 MG) TBCR Take 1 tablet by mouth 2 (two) times daily. Patient not taking: Reported on 12/29/2022 08/27/22   Cleon Gustin, MD  spironolactone (ALDACTONE) 25 MG tablet Take by mouth.    [provider]  vitamin B-12 (CYANOCOBALAMIN) 1000 MCG tablet Take 1,000 mcg by mouth daily.    [provider]    Family History Family History  Problem Relation Age of Onset   Hypertension Mother    Heart disease Father    Cancer Sister        breast   Colon cancer Neg Hx    Colon polyps Neg Hx     Social History Social History   Tobacco Use   Smoking status: Former    Packs/day: 0.25    Years: 5.00    Additional pack years: 0.00    Total pack years: 1.25    Types: Cigarettes    Quit date: 01/18/1962    Years since quitting: 60.9   Smokeless tobacco: Never   Tobacco comments:    1 pack cigarettes per week ; quit in her early 23s   Vaping Use   Vaping Use: Never used  Substance Use Topics   Alcohol use: No   Drug use: Never     Allergies   Metoprolol   Review of Systems Review of Systems Per HPI  Physical Exam Triage Vital Signs ED Triage Vitals  Enc Vitals Group     BP 12/29/22 0902 (!) 148/72     Pulse Rate 12/29/22 0902 75     Resp 12/29/22 0902 17     Temp 12/29/22 0902 97.6 F (36.4 C)     Temp Source 12/29/22 0902 Oral     SpO2 12/29/22 0902 98  %     Weight --      Height --      Head Circumference --      Peak Flow --      Pain Score 12/29/22 0914 6     Pain Loc --      Pain Edu? --      Excl. in [place]? --    No data found.  Updated Vital Signs BP (!) 148/72 (BP Location: Right Arm)   Pulse 75   Temp 97.6 F (36.4 C) (Oral)   Resp 17   LMP  (LMP Unknown)  SpO2 98%   Visual Acuity Right Eye Distance:   Left Eye Distance:   Bilateral Distance:    Right Eye Near:   Left Eye Near:    Bilateral Near:     Physical Exam Vitals and nursing note reviewed.  Constitutional:      General: She is not in acute distress.    Appearance: Normal appearance. She is not toxic-appearing.  HENT:     Mouth/Throat:     Mouth: Mucous membranes are moist.     Pharynx: Oropharynx is clear.  Pulmonary:     Effort: Pulmonary effort is normal. No respiratory distress.  Musculoskeletal:       Hands:     Comments: Distal tip of right index finger slightly erythematous, obviously swollen, no fluctuance or active drainage.  Slightly tender to touch.  There does appear to be a central area of hardening/possible foreign body.  Patient has full range of motion to the right index finger; no obvious deformity or open wound, all fingers on right hand are neurovascularly intact  Skin:    General: Skin is warm and dry.     Capillary Refill: Capillary refill takes less than 2 seconds.     Findings: Abscess present.  Neurological:     Mental Status: She is alert and oriented to person, place, and time.  Psychiatric:        Behavior: Behavior is cooperative.      UC Treatments / Results  Labs (all labs ordered are listed, but only abnormal results are displayed) Labs Reviewed - No data to display  EKG   Radiology No results found.  Procedures Foreign Body Removal  Date/Time: 12/29/2022 9:44 AM  Performed by: Eulogio Bear, NP Authorized by: Eulogio Bear, NP   Consent:    Consent obtained:  Verbal   Consent given  by:  Patient   Risks, benefits, and alternatives were discussed: yes     Risks discussed:  Bleeding, infection, pain, worsening of condition and incomplete removal   Alternatives discussed:  Alternative treatment and observation Universal protocol:    Procedure explained and questions answered to patient or proxy's satisfaction: yes     Patient identity confirmed:  Verbally with patient Location:    Location:  Finger   Finger location:  R index finger   Depth:  Intradermal   Tendon involvement:  None Pre-procedure details:    Imaging:  None   Neurovascular status: intact   Anesthesia:    Anesthesia method:  Topical application   Topical anesthesia: Freeze Spray. Procedure type:    Procedure complexity:  Simple Procedure details:    Incision length:  Pinpoint   Dissection of underlying tissues: no     Removal mechanism:  Forceps   Foreign bodies recovered:  None Post-procedure details:    Neurovascular status: intact     Confirmation:  Residual foreign bodies remain   Skin closure:  None   Dressing:  Open (no dressing)   Procedure completion:  Tolerated well, no immediate complications  (including critical care time)  Medications Ordered in UC Medications - No data to display  Initial Impression / Assessment and Plan / UC Course  I have reviewed the triage vital signs and the nursing notes.  Pertinent labs & imaging results that were available during my care of the patient were reviewed by me and considered in my medical decision making (see chart for details).   Patient is well-appearing, normotensive, afebrile, not tachycardic, not tachypneic, oxygenating well on  room air.    1. Abscess of right index finger 2. Foreign body of right index finger After shared decision making discussion with patient, patient opted proceed with attempted foreign body Attempted foreign body removal as above, procedure was unsuccessful Recommended warm soaks at home Will provide  antimicrobial coverage for abscess with doxycycline twice daily for 7 days Follow-up with hand specialist if persistent or worsening symptoms despite treatment  The patient was given the opportunity to ask questions.  All questions answered to their satisfaction.  The patient is in agreement to this plan.    Final Clinical Impressions(s) / UC Diagnoses   Final diagnoses:  Abscess of right index finger  Foreign body of right index finger     Discharge Instructions      Please start soaking your right finger in warm water twice daily to help draw out infection and hopefully the thorn Take the antibiotic as prescribed to help treat infection in your finger Follow up with Hand Specialist if symptoms persist or worsen despite treatment    ED Prescriptions     Medication Sig Dispense Auth. Provider   doxycycline (VIBRAMYCIN) 100 MG capsule Take 1 capsule (100 mg total) by mouth 2 (two) times daily for 7 days. 14 capsule Eulogio Bear, NP      PDMP not reviewed this encounter.   Eulogio Bear, NP 12/29/22 [phone]

## 2023-02-03 DIAGNOSIS — M1712 Unilateral primary osteoarthritis, left knee: Secondary | ICD-10-CM | POA: Diagnosis not present

## 2023-02-15 ENCOUNTER — Other Ambulatory Visit: Payer: Self-pay | Admitting: Orthopaedic Surgery

## 2023-02-15 DIAGNOSIS — M19072 Primary osteoarthritis, left ankle and foot: Secondary | ICD-10-CM | POA: Diagnosis not present

## 2023-02-15 DIAGNOSIS — M19079 Primary osteoarthritis, unspecified ankle and foot: Secondary | ICD-10-CM | POA: Insufficient documentation

## 2023-02-15 DIAGNOSIS — M79672 Pain in left foot: Secondary | ICD-10-CM | POA: Diagnosis not present

## 2023-03-01 DIAGNOSIS — M25561 Pain in right knee: Secondary | ICD-10-CM | POA: Diagnosis not present

## 2023-03-04 ENCOUNTER — Ambulatory Visit: Payer: Medicare Other | Admitting: Urology

## 2023-03-05 DIAGNOSIS — M25561 Pain in right knee: Secondary | ICD-10-CM | POA: Diagnosis not present

## 2023-03-10 DIAGNOSIS — M1711 Unilateral primary osteoarthritis, right knee: Secondary | ICD-10-CM | POA: Diagnosis not present

## 2023-03-16 ENCOUNTER — Ambulatory Visit: Payer: Medicare Other | Admitting: Urology

## 2023-03-23 DIAGNOSIS — E876 Hypokalemia: Secondary | ICD-10-CM | POA: Diagnosis not present

## 2023-03-23 DIAGNOSIS — Z01812 Encounter for preprocedural laboratory examination: Secondary | ICD-10-CM | POA: Diagnosis not present

## 2023-03-23 DIAGNOSIS — S83241A Other tear of medial meniscus, current injury, right knee, initial encounter: Secondary | ICD-10-CM | POA: Diagnosis not present

## 2023-04-07 DIAGNOSIS — M94261 Chondromalacia, right knee: Secondary | ICD-10-CM | POA: Diagnosis not present

## 2023-04-07 DIAGNOSIS — S83271A Complex tear of lateral meniscus, current injury, right knee, initial encounter: Secondary | ICD-10-CM | POA: Diagnosis not present

## 2023-04-07 DIAGNOSIS — G8918 Other acute postprocedural pain: Secondary | ICD-10-CM | POA: Diagnosis not present

## 2023-04-07 DIAGNOSIS — S83281A Other tear of lateral meniscus, current injury, right knee, initial encounter: Secondary | ICD-10-CM | POA: Diagnosis not present

## 2023-04-18 DIAGNOSIS — S83241D Other tear of medial meniscus, current injury, right knee, subsequent encounter: Secondary | ICD-10-CM | POA: Diagnosis not present

## 2023-04-18 DIAGNOSIS — S83281D Other tear of lateral meniscus, current injury, right knee, subsequent encounter: Secondary | ICD-10-CM | POA: Diagnosis not present

## 2023-04-20 ENCOUNTER — Ambulatory Visit (HOSPITAL_COMMUNITY)
Admission: RE | Admit: 2023-04-20 | Discharge: 2023-04-20 | Disposition: A | Discharge status: Home / Self Care | Payer: Medicare Other | Source: Ambulatory Visit | Attending: Urology | Admitting: Urology

## 2023-04-20 DIAGNOSIS — N200 Calculus of kidney: Secondary | ICD-10-CM | POA: Diagnosis not present

## 2023-04-22 ENCOUNTER — Ambulatory Visit (INDEPENDENT_AMBULATORY_CARE_PROVIDER_SITE_OTHER): Payer: Medicare Other | Admitting: Urology

## 2023-04-22 ENCOUNTER — Other Ambulatory Visit: Payer: Self-pay

## 2023-04-22 VITALS — BP 169/93 | HR 88

## 2023-04-22 DIAGNOSIS — N200 Calculus of kidney: Secondary | ICD-10-CM

## 2023-04-22 NOTE — H&P (View-Only) (Signed)
 04/22/2023 12:17 PM   Christine Newman 1941/01/05 [id number]  Referring provider: Roe Rutherford, NP [address] [place],  [place] [address]  nephrolithiasis   HPI: Christine Newman is a 81yo here for followup nephrolithiasis. No stone events since last visit. KUB from 7/10 shows increased stone burden on the left. She denies nay flank pain. No worsening LUTS   PMH: Past Medical History:  Diagnosis Date   Anal polyp 2005   condyloma acuminatum   Anemia    CKD (chronic kidney disease)    Cr 1.9 -09/2014   Colonic mass    Diverticulitis    with abscess   History of kidney stones    Hypertension     Surgical History: Past Surgical History:  Procedure Laterality Date   anal polypectomy  01/02/2004   Dr.Rosenbower- anal polyp removed. bx= condyloma acuminatum   CATARACT EXTRACTION W/PHACO Left 08/10/2018   Procedure: CATARACT EXTRACTION PHACO AND INTRAOCULAR LENS PLACEMENT LEFT EYE;  Surgeon: Gemma Payor, MD;  Location: [facility];  Service: Ophthalmology;  Laterality: Left;  left   CATARACT EXTRACTION W/PHACO Right 09/04/2018   Procedure: CATARACT EXTRACTION PHACO AND INTRAOCULAR LENS PLACEMENT (IOC);  Surgeon: Gemma Payor, MD;  Location: [facility];  Service: Ophthalmology;  Laterality: Right;  CDE: 11.16   COLONOSCOPY  04/08/2003   Dr.Mann- small nonbleeding internal and external hemorrhoids small polypoid mass at anal verge, normal appearing L colon, transverse colon, R colon including cecum. stenotic ileocecal valve. bx of maa= benign rectal type mucosa with features of mucosal prolapse syndrome   COLONOSCOPY N/A 04/02/2015   Dr.Rourk- colonic diverticulosis, no evidence of colonic neoplasm. abnormal ileocecal valve with ulceration bx= ulcerated/eroded benign ileocecal valve mucosa   COLONOSCOPY N/A 06/14/2018   scattered diverticula in sigmoid and descending colon. Distal 10 cm of neoterminal ileum appears normal. Appears to have surgical remission   FLEXIBLE  SIGMOIDOSCOPY  11/06/2003   Dr.Mann- small nodular mass at anal verge, bx done. early L side diverticula. bx= benign rectal mucosa with features of mucosal prolapse syndrome. no adenomatous changes or evidence of malignancy identified.    IR NEPHROSTOMY PLACEMENT LEFT  01/05/2018   KIDNEY STONE SURGERY     LAPAROSCOPY N/A 11/02/2017   Procedure: LAPAROSCOPIC ILEOCECECTOMY;  Surgeon: Romie Levee, MD;  Location: [facility];  Service: General;  Laterality: N/A;   NEPHROLITHOTOMY Left 02/13/2018   Procedure: NEPHROLITHOTOMY PERCUTANEOUS;  Surgeon: Malen Gauze, MD;  Location: [facility];  Service: Urology;  Laterality: Left;    Home Medications:  Allergies as of 04/22/2023       Reactions   Metoprolol    Bradycardia        Medication List        Accurate as of April 22, 2023 12:17 PM. If you have any questions, ask your nurse or doctor.          amLODipine 10 MG tablet Commonly known as: NORVASC TAKE (1) TABLET BY MOUTH AT BEDTIME.   cholecalciferol 1000 units tablet Commonly known as: VITAMIN D Take 1,000 Units by mouth daily.   cloNIDine 0.1 MG tablet Commonly known as: CATAPRES Take 1 tablet (0.1 mg total) by mouth 2 (two) times daily.   cyanocobalamin 1000 MCG tablet Commonly known as: VITAMIN B12 Take 1,000 mcg by mouth daily.   folic acid 400 MCG tablet Commonly known as: FOLVITE Take 400 mcg by mouth daily.   multivitamin with minerals tablet Take 1 tablet by mouth daily.   Potassium  Citrate 15 MEQ (1620 MG) Tbcr Commonly known as: Urocit-K 15 Take 1 tablet by mouth 2 (two) times daily.   spironolactone 25 MG tablet Commonly known as: ALDACTONE Take by mouth.        Allergies:  Allergies  Allergen Reactions   Metoprolol     Bradycardia    Family History: Family History  Problem Relation Age of Onset   Hypertension Mother    Heart disease Father    Cancer Sister        breast   Colon cancer Neg Hx    Colon polyps Neg Hx     Social  History:  reports that she quit smoking about 61 years ago. Her smoking use included cigarettes. She started smoking about 66 years ago. She has a 1.3 pack-year smoking history. She has never used smokeless tobacco. She reports that she does not drink alcohol and does not use drugs.  ROS: All other review of systems were reviewed and are negative except what is noted above in HPI  Physical Exam: BP (!) 169/93   Pulse 88   LMP  (LMP Unknown)   Constitutional:  Alert and oriented, No acute distress. HEENT:  AT, moist mucus membranes.  Trachea midline, no masses. Cardiovascular: No clubbing, cyanosis, or edema. Respiratory: Normal respiratory effort, no increased work of breathing. GI: Abdomen is soft, nontender, nondistended, no abdominal masses GU: No CVA tenderness.  Lymph: No cervical or inguinal lymphadenopathy. Skin: No rashes, bruises or suspicious lesions. Neurologic: Grossly intact, no focal deficits, moving all 4 extremities. Psychiatric: Normal mood and affect.  Laboratory Data: Lab Results  Component Value Date   WBC 6.1 07/31/2021   HGB 14.8 07/31/2021   HCT 45.6 07/31/2021   MCV 95 07/31/2021   PLT 245 07/31/2021    Lab Results  Component Value Date   CREATININE 1.60 (H) 07/31/2021    No results found for: "PSA"  No results found for: "TESTOSTERONE"  Lab Results  Component Value Date   HGBA1C 5.4 07/31/2021    Urinalysis    Component Value Date/Time   COLORURINE YELLOW 01/05/2018 1404   APPEARANCEUR Cloudy (A) 08/27/2022 0915   LABSPEC 1.018 01/05/2018 1404   PHURINE 5.0 01/05/2018 1404   GLUCOSEU Negative 08/27/2022 0915   HGBUR MODERATE (A) 01/05/2018 1404   BILIRUBINUR Negative 08/27/2022 0915   KETONESUR NEGATIVE 01/05/2018 1404   PROTEINUR 1+ (A) 08/27/2022 0915   PROTEINUR NEGATIVE 01/05/2018 1404   UROBILINOGEN 0.2 02/11/2020 1530   UROBILINOGEN 0.2 01/13/2015 1908   NITRITE Negative 08/27/2022 0915   NITRITE NEGATIVE 01/05/2018 1404    LEUKOCYTESUR 2+ (A) 08/27/2022 0915    Lab Results  Component Value Date   LABMICR See below: 08/27/2022   WBCUA 11-30 (A) 08/27/2022   LABEPIT 0-10 08/27/2022   MUCUS Present 02/11/2021   BACTERIA Moderate (A) 08/27/2022    Pertinent Imaging: KUB today: Images reviewed and discussed with the patient  Results for orders placed during the hospital encounter of 08/11/22  Abdomen 1 view (KUB)  Narrative CLINICAL DATA:  Nephrolithiasis, follow-up  EXAM: ABDOMEN - 1 VIEW  COMPARISON:  08/20/2021  FINDINGS: Two calculi project over the LEFT renal pelvis, each measuring 4 mm diameter.  Few pelvic phleboliths.  No additional urinary tract calcifications.  Bowel gas pattern normal.  Bones demineralized with degenerative changes of the lumbar spine.  IMPRESSION: Two 4 mm diameter calculi project over LEFT renal pelvis.   Electronically Signed By: Ulyses Southward M.D. On: 08/11/2022 13:48  No results found for this or any previous visit.  No results found for this or any previous visit.  No results found for this or any previous visit.  Results for orders placed during the hospital encounter of 07/03/18  US RENAL  Narrative CLINICAL DATA:  82 year old female with right kidney stone. Six-month follow-up.  EXAM: RENAL / URINARY TRACT ULTRASOUND COMPLETE  COMPARISON:  04/19/2018 renal sonogram. 04/05/2018 CT. Plain film of the abdomen same date dictated separately.  FINDINGS: Right Kidney:  Length: 9.8 cm. Slight increased renal parenchyma echogenicity. No hydronephrosis. 6 mm nonobstructing stone mid aspect. No right renal mass identified.  Left Kidney:  Length: 7.8 cm. Atrophic and difficult to adequately assess. No obvious mass or hydronephrosis.  Bladder:  Not evaluated as empty.  IMPRESSION: 1. 6 mm nonobstructing right renal calculi. No right-sided hydronephrosis. Mild increased renal parenchyma echogenicity of the right kidney may represent  changes of mild medical renal disease. 2. Atrophic left kidney difficult to visualize. No obvious left-sided hydronephrosis or mass.   Electronically Signed By: Lacy Duverney M.D. On: 07/03/2018 16:35  No valid procedures specified. No results found for this or any previous visit.  Results for orders placed during the hospital encounter of 02/13/18  CT RENAL STONE STUDY  Narrative CLINICAL DATA:  Left-sided pain. Reported left percutaneous nephrolithotomy 1 day prior.  EXAM: CT ABDOMEN AND PELVIS WITHOUT CONTRAST  TECHNIQUE: Multidetector CT imaging of the abdomen and pelvis was performed following the standard protocol without IV contrast.  COMPARISON:  02/08/2018 CT abdomen/pelvis.  FINDINGS: Lower chest: Subsegmental dependent bibasilar atelectasis. Subcentimeter calcified medial left lower lobe granuloma. Coronary atherosclerosis.  Hepatobiliary: Normal liver size. Simple 1.0 cm posterior right liver lobe cyst. Subcentimeter hypodense liver lesions near the gallbladder fossa (series 2/image 21), too small to characterize, stable, probably benign. No new liver lesions. Mildly distended gallbladder. No radiopaque cholelithiasis. No gallbladder wall thickening. No pericholecystic fluid. No biliary ductal dilatation.  Pancreas: Normal, with no mass or duct dilation.  Spleen: Normal size. No mass.  Adrenals/Urinary Tract: Normal adrenals. Asymmetric severe left renal parenchymal atrophy. Large bore left percutaneous nephrostomy tube terminates in the left renal pelvis. Well-positioned left nephroureteral stent with proximal pigtail portion in the left renal pelvis and distal pigtail portion in the bladder. Expected gas scattered in the left renal collecting system. A few mildly dilated calices in the left renal collecting system. Left renal pelvis collapse. Nonspecific haziness of the central left renal sinus fat, presumably inflammatory or due to postprocedure  change. No residual stone fragments in the left renal collecting system or left ureter. Mild ill-defined fluid and haziness in the left perinephric retroperitoneum. No contour deforming left renal mass.  Nonobstructing 4 mm interpolar and 2 mm lower right renal stones. Small simple parapelvic right renal cysts. No right hydronephrosis. Normal caliber right ureter, with no right ureteral stones. No contour deforming right renal mass.  Collapsed bladder. No bladder stones. Expected gas in the nondependent bladder from instrumentation.  Stomach/Bowel: Normal non-distended stomach. Stable postsurgical changes from ileocecal resection with intact appearing neo ileocolic anastomosis in the right abdomen. No small bowel wall thickening. A few mildly dilated small bowel loops in the right abdomen up to the 4.3 cm diameter without focal small bowel caliber transition. Remnant large bowel is normal caliber with no wall thickening or significant pericolonic fat stranding.  Vascular/Lymphatic: Atherosclerotic nonaneurysmal abdominal aorta. No pathologically enlarged lymph nodes in the abdomen or pelvis.  Reproductive: Grossly normal uterus.  No adnexal mass.  Other: No pneumoperitoneum, ascites or focal fluid collection.  Musculoskeletal: No aggressive appearing focal osseous lesions. Moderate lumbar spondylosis.  IMPRESSION: 1. Expected postprocedural change status post left percutaneous nephrolithotomy. No residual stone fragments in the left renal collecting system or left ureter. Well-positioned left percutaneous nephrostomy tube and left nephroureteral stent. A few dilated calices in the left renal collecting system with nondilated left renal pelvis. No focal fluid collections. 2. Nonobstructing right nephrolithiasis. 3. Mildly dilated right abdominal small bowel loops, favor mild post procedural ileus. 4.  Aortic Atherosclerosis (ICD10-I70.0).   Electronically Signed By: Delbert Phenix M.D. On: 02/14/2018 13:16   Assessment & Plan:    1. Calculus, renal -We discussed the management of kidney stones. These options include observation, ureteroscopy, shockwave lithotripsy (ESWL) and percutaneous nephrolithotomy (PCNL). We discussed which options are relevant to the patient's stone(s). We discussed the natural history of kidney stones as well as the complications of untreated stones and the impact on quality of life without treatment as well as with each of the above listed treatments. We also discussed the efficacy of each treatment in its ability to clear the stone burden. With any of these management options I discussed the signs and symptoms of infection and the need for emergent treatment should these be experienced. For each option we discussed the ability of each procedure to clear the patient of their stone burden.   For observation I described the risks which include but are not limited to silent renal damage, life-threatening infection, need for emergent surgery, failure to pass stone and pain.   For ureteroscopy I described the risks which include bleeding, infection, damage to contiguous structures, positioning injury, ureteral stricture, ureteral avulsion, ureteral injury, need for prolonged ureteral stent, inability to perform ureteroscopy, need for an interval procedure, inability to clear stone burden, stent discomfort/pain, heart attack, stroke, pulmonary embolus and the inherent risks with general anesthesia.   For shockwave lithotripsy I described the risks which include arrhythmia, kidney contusion, kidney hemorrhage, need for transfusion, pain, inability to adequately break up stone, inability to pass stone fragments, Steinstrasse, infection associated with obstructing stones, need for alternate surgical procedure, need for repeat shockwave lithotripsy, MI, CVA, PE and the inherent risks with anesthesia/conscious sedation.   For PCNL I described the risks  including positioning injury, pneumothorax, hydrothorax, need for chest tube, inability to clear stone burden, renal laceration, arterial venous fistula or malformation, need for embolization of kidney, loss of kidney or renal function, need for repeat procedure, need for prolonged nephrostomy tube, ureteral avulsion, MI, CVA, PE and the inherent risks of general anesthesia.   - The patient would like to proceed with left ESWL - Urinalysis, Routine w reflex microscopic   No follow-ups on file.  Wilkie Aye, MD  [facility] [place]

## 2023-04-22 NOTE — Progress Notes (Signed)
04/22/2023 12:17 PM   Christine Newman 1941/01/05 [id number]  Referring provider: Roe Rutherford, NP [address] [place],  [place] [address]  nephrolithiasis   HPI: Christine Newman is a 81yo here for followup nephrolithiasis. No stone events since last visit. KUB from 7/10 shows increased stone burden on the left. She denies nay flank pain. No worsening LUTS   PMH: Past Medical History:  Diagnosis Date   Anal polyp 2005   condyloma acuminatum   Anemia    CKD (chronic kidney disease)    Cr 1.9 -09/2014   Colonic mass    Diverticulitis    with abscess   History of kidney stones    Hypertension     Surgical History: Past Surgical History:  Procedure Laterality Date   anal polypectomy  01/02/2004   Dr.Rosenbower- anal polyp removed. bx= condyloma acuminatum   CATARACT EXTRACTION W/PHACO Left 08/10/2018   Procedure: CATARACT EXTRACTION PHACO AND INTRAOCULAR LENS PLACEMENT LEFT EYE;  Surgeon: Gemma Payor, MD;  Location: [facility];  Service: Ophthalmology;  Laterality: Left;  left   CATARACT EXTRACTION W/PHACO Right 09/04/2018   Procedure: CATARACT EXTRACTION PHACO AND INTRAOCULAR LENS PLACEMENT (IOC);  Surgeon: Gemma Payor, MD;  Location: [facility];  Service: Ophthalmology;  Laterality: Right;  CDE: 11.16   COLONOSCOPY  04/08/2003   Dr.Mann- small nonbleeding internal and external hemorrhoids small polypoid mass at anal verge, normal appearing L colon, transverse colon, R colon including cecum. stenotic ileocecal valve. bx of maa= benign rectal type mucosa with features of mucosal prolapse syndrome   COLONOSCOPY N/A 04/02/2015   Dr.Rourk- colonic diverticulosis, no evidence of colonic neoplasm. abnormal ileocecal valve with ulceration bx= ulcerated/eroded benign ileocecal valve mucosa   COLONOSCOPY N/A 06/14/2018   scattered diverticula in sigmoid and descending colon. Distal 10 cm of neoterminal ileum appears normal. Appears to have surgical remission   FLEXIBLE  SIGMOIDOSCOPY  11/06/2003   Dr.Mann- small nodular mass at anal verge, bx done. early L side diverticula. bx= benign rectal mucosa with features of mucosal prolapse syndrome. no adenomatous changes or evidence of malignancy identified.    IR NEPHROSTOMY PLACEMENT LEFT  01/05/2018   KIDNEY STONE SURGERY     LAPAROSCOPY N/A 11/02/2017   Procedure: LAPAROSCOPIC ILEOCECECTOMY;  Surgeon: Romie Levee, MD;  Location: [facility];  Service: General;  Laterality: N/A;   NEPHROLITHOTOMY Left 02/13/2018   Procedure: NEPHROLITHOTOMY PERCUTANEOUS;  Surgeon: Malen Gauze, MD;  Location: [facility];  Service: Urology;  Laterality: Left;    Home Medications:  Allergies as of 04/22/2023       Reactions   Metoprolol    Bradycardia        Medication List        Accurate as of April 22, 2023 12:17 PM. If you have any questions, ask your nurse or doctor.          amLODipine 10 MG tablet Commonly known as: NORVASC TAKE (1) TABLET BY MOUTH AT BEDTIME.   cholecalciferol 1000 units tablet Commonly known as: VITAMIN D Take 1,000 Units by mouth daily.   cloNIDine 0.1 MG tablet Commonly known as: CATAPRES Take 1 tablet (0.1 mg total) by mouth 2 (two) times daily.   cyanocobalamin 1000 MCG tablet Commonly known as: VITAMIN B12 Take 1,000 mcg by mouth daily.   folic acid 400 MCG tablet Commonly known as: FOLVITE Take 400 mcg by mouth daily.   multivitamin with minerals tablet Take 1 tablet by mouth daily.   Potassium  Citrate 15 MEQ (1620 MG) Tbcr Commonly known as: Urocit-K 15 Take 1 tablet by mouth 2 (two) times daily.   spironolactone 25 MG tablet Commonly known as: ALDACTONE Take by mouth.        Allergies:  Allergies  Allergen Reactions   Metoprolol     Bradycardia    Family History: Family History  Problem Relation Age of Onset   Hypertension Mother    Heart disease Father    Cancer Sister        breast   Colon cancer Neg Hx    Colon polyps Neg Hx     Social  History:  reports that she quit smoking about 61 years ago. Her smoking use included cigarettes. She started smoking about 66 years ago. She has a 1.3 pack-year smoking history. She has never used smokeless tobacco. She reports that she does not drink alcohol and does not use drugs.  ROS: All other review of systems were reviewed and are negative except what is noted above in HPI  Physical Exam: BP (!) 169/93   Pulse 88   LMP  (LMP Unknown)   Constitutional:  Alert and oriented, No acute distress. HEENT:  AT, moist mucus membranes.  Trachea midline, no masses. Cardiovascular: No clubbing, cyanosis, or edema. Respiratory: Normal respiratory effort, no increased work of breathing. GI: Abdomen is soft, nontender, nondistended, no abdominal masses GU: No CVA tenderness.  Lymph: No cervical or inguinal lymphadenopathy. Skin: No rashes, bruises or suspicious lesions. Neurologic: Grossly intact, no focal deficits, moving all 4 extremities. Psychiatric: Normal mood and affect.  Laboratory Data: Lab Results  Component Value Date   WBC 6.1 07/31/2021   HGB 14.8 07/31/2021   HCT 45.6 07/31/2021   MCV 95 07/31/2021   PLT 245 07/31/2021    Lab Results  Component Value Date   CREATININE 1.60 (H) 07/31/2021    No results found for: "PSA"  No results found for: "TESTOSTERONE"  Lab Results  Component Value Date   HGBA1C 5.4 07/31/2021    Urinalysis    Component Value Date/Time   COLORURINE YELLOW 01/05/2018 1404   APPEARANCEUR Cloudy (A) 08/27/2022 0915   LABSPEC 1.018 01/05/2018 1404   PHURINE 5.0 01/05/2018 1404   GLUCOSEU Negative 08/27/2022 0915   HGBUR MODERATE (A) 01/05/2018 1404   BILIRUBINUR Negative 08/27/2022 0915   KETONESUR NEGATIVE 01/05/2018 1404   PROTEINUR 1+ (A) 08/27/2022 0915   PROTEINUR NEGATIVE 01/05/2018 1404   UROBILINOGEN 0.2 02/11/2020 1530   UROBILINOGEN 0.2 01/13/2015 1908   NITRITE Negative 08/27/2022 0915   NITRITE NEGATIVE 01/05/2018 1404    LEUKOCYTESUR 2+ (A) 08/27/2022 0915    Lab Results  Component Value Date   LABMICR See below: 08/27/2022   WBCUA 11-30 (A) 08/27/2022   LABEPIT 0-10 08/27/2022   MUCUS Present 02/11/2021   BACTERIA Moderate (A) 08/27/2022    Pertinent Imaging: KUB today: Images reviewed and discussed with the patient  Results for orders placed during the hospital encounter of 08/11/22  Abdomen 1 view (KUB)  Narrative CLINICAL DATA:  Nephrolithiasis, follow-up  EXAM: ABDOMEN - 1 VIEW  COMPARISON:  08/20/2021  FINDINGS: Two calculi project over the LEFT renal pelvis, each measuring 4 mm diameter.  Few pelvic phleboliths.  No additional urinary tract calcifications.  Bowel gas pattern normal.  Bones demineralized with degenerative changes of the lumbar spine.  IMPRESSION: Two 4 mm diameter calculi project over LEFT renal pelvis.   Electronically Signed By: Ulyses Southward M.D. On: 08/11/2022 13:48  No results found for this or any previous visit.  No results found for this or any previous visit.  No results found for this or any previous visit.  Results for orders placed during the hospital encounter of 07/03/18  US RENAL  Narrative CLINICAL DATA:  82 year old female with right kidney stone. Six-month follow-up.  EXAM: RENAL / URINARY TRACT ULTRASOUND COMPLETE  COMPARISON:  04/19/2018 renal sonogram. 04/05/2018 CT. Plain film of the abdomen same date dictated separately.  FINDINGS: Right Kidney:  Length: 9.8 cm. Slight increased renal parenchyma echogenicity. No hydronephrosis. 6 mm nonobstructing stone mid aspect. No right renal mass identified.  Left Kidney:  Length: 7.8 cm. Atrophic and difficult to adequately assess. No obvious mass or hydronephrosis.  Bladder:  Not evaluated as empty.  IMPRESSION: 1. 6 mm nonobstructing right renal calculi. No right-sided hydronephrosis. Mild increased renal parenchyma echogenicity of the right kidney may represent  changes of mild medical renal disease. 2. Atrophic left kidney difficult to visualize. No obvious left-sided hydronephrosis or mass.   Electronically Signed By: Lacy Duverney M.D. On: 07/03/2018 16:35  No valid procedures specified. No results found for this or any previous visit.  Results for orders placed during the hospital encounter of 02/13/18  CT RENAL STONE STUDY  Narrative CLINICAL DATA:  Left-sided pain. Reported left percutaneous nephrolithotomy 1 day prior.  EXAM: CT ABDOMEN AND PELVIS WITHOUT CONTRAST  TECHNIQUE: Multidetector CT imaging of the abdomen and pelvis was performed following the standard protocol without IV contrast.  COMPARISON:  02/08/2018 CT abdomen/pelvis.  FINDINGS: Lower chest: Subsegmental dependent bibasilar atelectasis. Subcentimeter calcified medial left lower lobe granuloma. Coronary atherosclerosis.  Hepatobiliary: Normal liver size. Simple 1.0 cm posterior right liver lobe cyst. Subcentimeter hypodense liver lesions near the gallbladder fossa (series 2/image 21), too small to characterize, stable, probably benign. No new liver lesions. Mildly distended gallbladder. No radiopaque cholelithiasis. No gallbladder wall thickening. No pericholecystic fluid. No biliary ductal dilatation.  Pancreas: Normal, with no mass or duct dilation.  Spleen: Normal size. No mass.  Adrenals/Urinary Tract: Normal adrenals. Asymmetric severe left renal parenchymal atrophy. Large bore left percutaneous nephrostomy tube terminates in the left renal pelvis. Well-positioned left nephroureteral stent with proximal pigtail portion in the left renal pelvis and distal pigtail portion in the bladder. Expected gas scattered in the left renal collecting system. A few mildly dilated calices in the left renal collecting system. Left renal pelvis collapse. Nonspecific haziness of the central left renal sinus fat, presumably inflammatory or due to postprocedure  change. No residual stone fragments in the left renal collecting system or left ureter. Mild ill-defined fluid and haziness in the left perinephric retroperitoneum. No contour deforming left renal mass.  Nonobstructing 4 mm interpolar and 2 mm lower right renal stones. Small simple parapelvic right renal cysts. No right hydronephrosis. Normal caliber right ureter, with no right ureteral stones. No contour deforming right renal mass.  Collapsed bladder. No bladder stones. Expected gas in the nondependent bladder from instrumentation.  Stomach/Bowel: Normal non-distended stomach. Stable postsurgical changes from ileocecal resection with intact appearing neo ileocolic anastomosis in the right abdomen. No small bowel wall thickening. A few mildly dilated small bowel loops in the right abdomen up to the 4.3 cm diameter without focal small bowel caliber transition. Remnant large bowel is normal caliber with no wall thickening or significant pericolonic fat stranding.  Vascular/Lymphatic: Atherosclerotic nonaneurysmal abdominal aorta. No pathologically enlarged lymph nodes in the abdomen or pelvis.  Reproductive: Grossly normal uterus.  No adnexal mass.  Other: No pneumoperitoneum, ascites or focal fluid collection.  Musculoskeletal: No aggressive appearing focal osseous lesions. Moderate lumbar spondylosis.  IMPRESSION: 1. Expected postprocedural change status post left percutaneous nephrolithotomy. No residual stone fragments in the left renal collecting system or left ureter. Well-positioned left percutaneous nephrostomy tube and left nephroureteral stent. A few dilated calices in the left renal collecting system with nondilated left renal pelvis. No focal fluid collections. 2. Nonobstructing right nephrolithiasis. 3. Mildly dilated right abdominal small bowel loops, favor mild post procedural ileus. 4.  Aortic Atherosclerosis (ICD10-I70.0).   Electronically Signed By: Delbert Phenix M.D. On: 02/14/2018 13:16   Assessment & Plan:    1. Calculus, renal -We discussed the management of kidney stones. These options include observation, ureteroscopy, shockwave lithotripsy (ESWL) and percutaneous nephrolithotomy (PCNL). We discussed which options are relevant to the patient's stone(s). We discussed the natural history of kidney stones as well as the complications of untreated stones and the impact on quality of life without treatment as well as with each of the above listed treatments. We also discussed the efficacy of each treatment in its ability to clear the stone burden. With any of these management options I discussed the signs and symptoms of infection and the need for emergent treatment should these be experienced. For each option we discussed the ability of each procedure to clear the patient of their stone burden.   For observation I described the risks which include but are not limited to silent renal damage, life-threatening infection, need for emergent surgery, failure to pass stone and pain.   For ureteroscopy I described the risks which include bleeding, infection, damage to contiguous structures, positioning injury, ureteral stricture, ureteral avulsion, ureteral injury, need for prolonged ureteral stent, inability to perform ureteroscopy, need for an interval procedure, inability to clear stone burden, stent discomfort/pain, heart attack, stroke, pulmonary embolus and the inherent risks with general anesthesia.   For shockwave lithotripsy I described the risks which include arrhythmia, kidney contusion, kidney hemorrhage, need for transfusion, pain, inability to adequately break up stone, inability to pass stone fragments, Steinstrasse, infection associated with obstructing stones, need for alternate surgical procedure, need for repeat shockwave lithotripsy, MI, CVA, PE and the inherent risks with anesthesia/conscious sedation.   For PCNL I described the risks  including positioning injury, pneumothorax, hydrothorax, need for chest tube, inability to clear stone burden, renal laceration, arterial venous fistula or malformation, need for embolization of kidney, loss of kidney or renal function, need for repeat procedure, need for prolonged nephrostomy tube, ureteral avulsion, MI, CVA, PE and the inherent risks of general anesthesia.   - The patient would like to proceed with left ESWL - Urinalysis, Routine w reflex microscopic   No follow-ups on file.  Wilkie Aye, MD  [facility] [place]

## 2023-04-25 DIAGNOSIS — Z1231 Encounter for screening mammogram for malignant neoplasm of breast: Secondary | ICD-10-CM | POA: Diagnosis not present

## 2023-04-29 ENCOUNTER — Encounter (HOSPITAL_COMMUNITY): Payer: Self-pay

## 2023-04-29 ENCOUNTER — Encounter (HOSPITAL_COMMUNITY)
Admission: RE | Admit: 2023-04-29 | Discharge: 2023-04-29 | Disposition: A | Discharge status: Home / Self Care | Payer: Medicare Other | Source: Ambulatory Visit | Attending: Urology | Admitting: Urology

## 2023-04-29 ENCOUNTER — Other Ambulatory Visit: Payer: Self-pay

## 2023-05-02 ENCOUNTER — Telehealth: Payer: Self-pay

## 2023-05-02 NOTE — Telephone Encounter (Signed)
Patient put Voltaran on her foot over the weekend.  She was told her surgery will need to be rescheduled.   Please advise.

## 2023-05-03 ENCOUNTER — Encounter: Payer: Self-pay | Admitting: Urology

## 2023-05-03 ENCOUNTER — Encounter (HOSPITAL_COMMUNITY): Admission: RE | Payer: Self-pay | Source: Home / Self Care

## 2023-05-03 ENCOUNTER — Ambulatory Visit (HOSPITAL_COMMUNITY): Admission: RE | Admit: 2023-05-03 | Payer: Medicare Other | Source: Home / Self Care | Admitting: Urology

## 2023-05-03 DIAGNOSIS — N200 Calculus of kidney: Secondary | ICD-10-CM

## 2023-05-03 SURGERY — EXTRACORPOREAL SHOCK WAVE LITHOTRIPSY (ESWL)
Anesthesia: LOCAL | Laterality: Left

## 2023-05-03 NOTE — Telephone Encounter (Signed)
Pt will need litho rescheduled.

## 2023-05-03 NOTE — Patient Instructions (Signed)

## 2023-05-04 NOTE — Telephone Encounter (Signed)
I spoke with Christine Newman. We have discussed possible surgery dates and 05/10/2023 was agreed upon by all parties. Patient given information about surgery date, what to expect pre-operatively and post operatively.    We discussed that a pre-op nurse will be calling to set up the pre-op visit that will take place prior to surgery. Informed patient that our office will communicate any additional care to be provided after surgery.    Patients questions or concerns were discussed during our call. Advised to call our office should there be any additional information, questions or concerns that arise. Patient verbalized understanding.

## 2023-05-06 ENCOUNTER — Telehealth: Payer: Self-pay

## 2023-05-06 NOTE — Telephone Encounter (Signed)
Patient aware of litho date and that [facility] preop will reach out with time of procedure

## 2023-05-06 NOTE — Telephone Encounter (Signed)
Patient called to follow up on her scheduled procedure date. She requested a call back at   [phone]    Thank you

## 2023-05-09 ENCOUNTER — Encounter (HOSPITAL_COMMUNITY): Payer: Self-pay

## 2023-05-09 ENCOUNTER — Ambulatory Visit (HOSPITAL_COMMUNITY): Payer: Medicare Other | Admitting: Physical Therapy

## 2023-05-09 ENCOUNTER — Encounter (HOSPITAL_COMMUNITY)
Admission: RE | Admit: 2023-05-09 | Discharge: 2023-05-09 | Disposition: A | Discharge status: Home / Self Care | Payer: Medicare Other | Source: Ambulatory Visit | Attending: Urology | Admitting: Urology

## 2023-05-09 DIAGNOSIS — I129 Hypertensive chronic kidney disease with stage 1 through stage 4 chronic kidney disease, or unspecified chronic kidney disease: Secondary | ICD-10-CM | POA: Diagnosis not present

## 2023-05-09 DIAGNOSIS — K5720 Diverticulitis of large intestine with perforation and abscess without bleeding: Secondary | ICD-10-CM | POA: Diagnosis not present

## 2023-05-09 DIAGNOSIS — N390 Urinary tract infection, site not specified: Secondary | ICD-10-CM | POA: Diagnosis not present

## 2023-05-09 DIAGNOSIS — N183 Chronic kidney disease, stage 3 unspecified: Secondary | ICD-10-CM | POA: Diagnosis not present

## 2023-05-09 DIAGNOSIS — N200 Calculus of kidney: Secondary | ICD-10-CM | POA: Diagnosis not present

## 2023-05-10 ENCOUNTER — Ambulatory Visit (HOSPITAL_COMMUNITY): Payer: Medicare Other

## 2023-05-10 ENCOUNTER — Encounter (HOSPITAL_COMMUNITY): Payer: Self-pay | Admitting: Urology

## 2023-05-10 ENCOUNTER — Encounter (HOSPITAL_COMMUNITY)
Admission: RE | Disposition: A | Discharge status: Home / Self Care | Payer: Self-pay | Source: Home / Self Care | Attending: Urology

## 2023-05-10 ENCOUNTER — Ambulatory Visit (HOSPITAL_COMMUNITY)
Admission: RE | Admit: 2023-05-10 | Discharge: 2023-05-10 | Disposition: A | Discharge status: Home / Self Care | Payer: Medicare Other | Attending: Urology | Admitting: Urology

## 2023-05-10 DIAGNOSIS — N200 Calculus of kidney: Secondary | ICD-10-CM | POA: Insufficient documentation

## 2023-05-10 HISTORY — PX: EXTRACORPOREAL SHOCK WAVE LITHOTRIPSY: SHX1557

## 2023-05-10 SURGERY — EXTRACORPOREAL SHOCK WAVE LITHOTRIPSY (ESWL)
Anesthesia: LOCAL | Laterality: Left

## 2023-05-10 MED ORDER — DIPHENHYDRAMINE HCL 25 MG PO CAPS
25.0000 mg | ORAL_CAPSULE | ORAL | Status: AC
  Administered 2023-05-10: 25 mg via ORAL
  Filled 2023-05-10: qty 1

## 2023-05-10 MED ORDER — DIPHENHYDRAMINE HCL 25 MG PO CAPS: 25.0000 mg | ORAL_CAPSULE | ORAL | Status: DC

## 2023-05-10 MED ORDER — TAMSULOSIN HCL 0.4 MG PO CAPS: 0.4000 mg | ORAL_CAPSULE | Freq: Every day | ORAL | 0 refills | Status: DC

## 2023-05-10 MED ORDER — DIAZEPAM 5 MG PO TABS
10.0000 mg | ORAL_TABLET | Freq: Once | ORAL | Status: AC
  Administered 2023-05-10: 10 mg via ORAL
  Filled 2023-05-10: qty 2

## 2023-05-10 MED ORDER — HYDROCODONE-ACETAMINOPHEN 5-325 MG PO TABS: 1.0000 | ORAL_TABLET | Freq: Four times a day (QID) | ORAL | 0 refills | Status: DC | PRN

## 2023-05-10 MED ORDER — ONDANSETRON HCL 4 MG PO TABS: 4.0000 mg | ORAL_TABLET | Freq: Every day | ORAL | 1 refills | Status: AC | PRN

## 2023-05-10 MED ORDER — SODIUM CHLORIDE 0.9 % IV SOLN: INTRAVENOUS | Status: DC

## 2023-05-10 NOTE — Interval H&P Note (Signed)
History and Physical Interval Note:  05/10/2023 8:57 AM  Christine Newman  has presented today for surgery, with the diagnosis of left renal calculus.  The various methods of treatment have been discussed with the patient and family. After consideration of risks, benefits and other options for treatment, the patient has consented to  Procedure(s): EXTRACORPOREAL SHOCK WAVE LITHOTRIPSY (ESWL) (Left) as a surgical intervention.  The patient's history has been reviewed, patient examined, no change in status, stable for surgery.  I have reviewed the patient's chart and labs.  Questions were answered to the patient's satisfaction.     Wilkie Aye

## 2023-05-12 ENCOUNTER — Encounter (HOSPITAL_COMMUNITY): Payer: Self-pay | Admitting: Urology

## 2023-05-17 ENCOUNTER — Encounter: Payer: Medicare Other | Admitting: Urology

## 2023-05-18 DIAGNOSIS — S83241D Other tear of medial meniscus, current injury, right knee, subsequent encounter: Secondary | ICD-10-CM | POA: Diagnosis not present

## 2023-05-26 ENCOUNTER — Encounter: Payer: Medicare Other | Admitting: Urology

## 2023-06-02 NOTE — Progress Notes (Signed)
Name: Christine Newman DOB: 1941-04-14 MRN: [id number]  Diagnoses: 1) Post-operative state  HPI: Christine Newman presents post-operatively s/p left ESWL procedure on 05/10/2023 by Dr. Ronne Binning for management of a left UPJ stone.  Postop course: KUB today: Awaiting radiology read; left UPJ stone appears to have passed.  Today She denies increased urinary urgency, frequency, nocturia, dysuria, gross hematuria, hesitancy, straining to void, or sensations of incomplete emptying. Pt denies flank or abdominal pain.   Fall Screening: Do you usually have a device to assist in your mobility? No   Medications: Current Outpatient Medications  Medication Sig Dispense Refill   amLODipine (NORVASC) 10 MG tablet TAKE (1) TABLET BY MOUTH AT BEDTIME. 90 tablet 0   cholecalciferol (VITAMIN D) 1000 units tablet Take 1,000 Units by mouth daily.     cloNIDine (CATAPRES) 0.1 MG tablet Take 1 tablet (0.1 mg total) by mouth 2 (two) times daily. 180 tablet 1   folic acid (FOLVITE) 400 MCG tablet Take 400 mcg by mouth daily.     HYDROcodone-acetaminophen (NORCO) 5-325 MG tablet Take 1 tablet by mouth every 6 (six) hours as needed for moderate pain. 30 tablet 0   Multiple Vitamins-Minerals (MULTIVITAMIN WITH MINERALS) tablet Take 1 tablet by mouth daily.     Potassium Citrate (UROCIT-K 15) 15 MEQ (1620 MG) TBCR Take 1 tablet by mouth 2 (two) times daily. 180 tablet 3   spironolactone (ALDACTONE) 25 MG tablet Take by mouth.     vitamin B-12 (CYANOCOBALAMIN) 1000 MCG tablet Take 1,000 mcg by mouth daily.     ondansetron (ZOFRAN) 4 MG tablet Take 1 tablet (4 mg total) by mouth daily as needed for nausea or vomiting. (Patient not taking: Reported on 06/03/2023) 30 tablet 1   tamsulosin (FLOMAX) 0.4 MG CAPS capsule Take 1 capsule (0.4 mg total) by mouth daily after supper. (Patient not taking: Reported on 06/03/2023) 30 capsule 0   No current facility-administered medications for this visit.     Allergies: Allergies  Allergen Reactions   Metoprolol     Bradycardia    Past Medical History:  Diagnosis Date   Anal polyp 2005   condyloma acuminatum   Anemia    CKD (chronic kidney disease)    Cr 1.9 -09/2014   Colonic mass    Diverticulitis    with abscess   History of kidney stones    Hypertension    Past Surgical History:  Procedure Laterality Date   anal polypectomy  01/02/2004   Dr.Rosenbower- anal polyp removed. bx= condyloma acuminatum   CATARACT EXTRACTION W/PHACO Left 08/10/2018   Procedure: CATARACT EXTRACTION PHACO AND INTRAOCULAR LENS PLACEMENT LEFT EYE;  Surgeon: Gemma Payor, MD;  Location: [facility];  Service: Ophthalmology;  Laterality: Left;  left   CATARACT EXTRACTION W/PHACO Right 09/04/2018   Procedure: CATARACT EXTRACTION PHACO AND INTRAOCULAR LENS PLACEMENT (IOC);  Surgeon: Gemma Payor, MD;  Location: [facility];  Service: Ophthalmology;  Laterality: Right;  CDE: 11.16   COLONOSCOPY  04/08/2003   Dr.Mann- small nonbleeding internal and external hemorrhoids small polypoid mass at anal verge, normal appearing L colon, transverse colon, R colon including cecum. stenotic ileocecal valve. bx of maa= benign rectal type mucosa with features of mucosal prolapse syndrome   COLONOSCOPY N/A 04/02/2015   Dr.Rourk- colonic diverticulosis, no evidence of colonic neoplasm. abnormal ileocecal valve with ulceration bx= ulcerated/eroded benign ileocecal valve mucosa   COLONOSCOPY N/A 06/14/2018   scattered diverticula in sigmoid and descending colon. Distal 10 cm of neoterminal ileum appears  normal. Appears to have surgical remission   EXTRACORPOREAL SHOCK WAVE LITHOTRIPSY Left 05/10/2023   Procedure: EXTRACORPOREAL SHOCK WAVE LITHOTRIPSY (ESWL);  Surgeon: Malen Gauze, MD;  Location: [facility];  Service: Urology;  Laterality: Left;   FLEXIBLE SIGMOIDOSCOPY  11/06/2003   Dr.Mann- small nodular mass at anal verge, bx done. early L side diverticula. bx= benign rectal mucosa  with features of mucosal prolapse syndrome. no adenomatous changes or evidence of malignancy identified.    IR NEPHROSTOMY PLACEMENT LEFT  01/05/2018   KIDNEY STONE SURGERY     LAPAROSCOPY N/A 11/02/2017   Procedure: LAPAROSCOPIC ILEOCECECTOMY;  Surgeon: Romie Levee, MD;  Location: [facility];  Service: General;  Laterality: N/A;   NEPHROLITHOTOMY Left 02/13/2018   Procedure: NEPHROLITHOTOMY PERCUTANEOUS;  Surgeon: Malen Gauze, MD;  Location: [facility];  Service: Urology;  Laterality: Left;   Family History  Problem Relation Age of Onset   Hypertension Mother    Heart disease Father    Cancer Sister        breast   Colon cancer Neg Hx    Colon polyps Neg Hx    Social History   Socioeconomic History   Marital status: Widowed    Spouse name: Not on file   Number of children: Not on file   Years of education: Not on file   Highest education level: Not on file  Occupational History   Not on file  Tobacco Use   Smoking status: Former    Current packs/day: 0.00    Average packs/day: 0.3 packs/day for 5.0 years (1.3 ttl pk-yrs)    Types: Cigarettes    Start date: 01/18/1957    Quit date: 01/18/1962    Years since quitting: 61.4   Smokeless tobacco: Never   Tobacco comments:    1 pack cigarettes per week ; quit in her early 81s   Vaping Use   Vaping status: Never Used  Substance and Sexual Activity   Alcohol use: No   Drug use: Never   Sexual activity: Not Currently    Birth control/protection: Post-menopausal  Other Topics Concern   Not on file  Social History Narrative   Not on file   Social Determinants of Health   Financial Resource Strain: Low Risk  (08/05/2022)   Overall Financial Resource Strain (CARDIA)    Difficulty of Paying Living Expenses: Not hard at all  Food Insecurity: No Food Insecurity (08/05/2022)   Hunger Vital Sign    Worried About Running Out of Food in the Last Year: Never true    Ran Out of Food in the Last Year: Never true  Transportation  Needs: No Transportation Needs (08/05/2022)   PRAPARE - Administrator, Civil Service (Medical): No    Lack of Transportation (Non-Medical): No  Physical Activity: Sufficiently Active (08/05/2022)   Exercise Vital Sign    Days of Exercise per Week: 5 days    Minutes of Exercise per Session: 50 min  Stress: No Stress Concern Present (08/05/2022)   [employer] of Occupational Health - Occupational Stress Questionnaire    Feeling of Stress : Not at all  Social Connections: Unknown (04/10/2023)   Received from [facility]   Social Network    Social Network: Not on file  Intimate Partner Violence: Unknown (04/10/2023)   Received from [facility], [facility]   HITS    Physically Hurt: Not on file    Insult or Talk Down To: Not on file  Threaten Physical Harm: Not on file    Scream or Curse: Not on file    SUBJECTIVE  Review of Systems Constitutional: Patient denies any unintentional weight loss or change in strength lntegumentary: Patient denies any rashes or pruritus Cardiovascular: Patient denies chest pain or syncope Respiratory: Patient denies shortness of breath Gastrointestinal: Patient denies nausea, vomiting, constipation, or diarrhea Musculoskeletal: Patient denies muscle cramps or weakness Neurologic: Patient denies convulsions or seizures Psychiatric: Patient denies memory problems Allergic/Immunologic: Patient denies recent allergic reaction(s) Hematologic/Lymphatic: Patient denies bleeding tendencies Endocrine: Patient denies heat/cold intolerance  GU: As per HPI.  OBJECTIVE Vitals:   06/03/23 1125  BP: (!) 171/79  Pulse: 80  Temp: (!) 97.5 F (36.4 C)   There is no height or weight on file to calculate BMI.  Physical Examination Constitutional: No obvious distress; patient is non-toxic appearing  Cardiovascular: No visible lower extremity edema.  Respiratory: The patient does not have audible wheezing/stridor;  respirations do not appear labored  Gastrointestinal: Abdomen non-distended Musculoskeletal: Normal ROM of UEs  Skin: No obvious rashes/open sores  Neurologic: CN 2-12 grossly intact Psychiatric: Answered questions appropriately with normal affect  Hematologic/Lymphatic/Immunologic: No obvious bruises or sites of spontaneous bleeding  UA: >30 WBC/hpf, 3-10 RBC/hpf, bacteria (many)  ASSESSMENT Kidney stones - Plan: Urinalysis, Routine w reflex microscopic, Calculi, with Photograph (to Clinical Lab), DG Abd 1 View  Postop check - Plan: Urinalysis, Routine w reflex microscopic, Calculi, with Photograph (to Clinical Lab)  Asymptomatic bacteriuria  We reviewed the operative procedures and findings.  She is doing well. Pre-operative symptoms are  since the procedure. We reviewed recent imaging results; awaiting radiology results, appears to have no acute findings.  For stone prevention: Advised adequate hydration and we discussed option to consider low oxalate diet given that calcium oxalate is the most common type of stone. Handout provided about stone prevention diet.  UA today appears abnormal however patient is asymptomatic for UTI, therefore acute antibiotic treatment is not advised at this time. The rationale for this was discussed with the patient.  - We discussed the difference between asymptomatic bacteriuria versus symptoms suggestive of a possible UTI which may include: fever, confusion, malaise, increased fatigue, dysuria, acute increase in urinary frequency / urgency / urge incontinence. - Patient was advised that positive UA / urine cultures only warrant acute antibiotic treatment if UTI symptoms are present. - In the absence of active UTI symptoms, a positive urine culture is considered to represent colonization of the urinary tract which does not warrant acute antibiotic treatment. - We discussed antibiotic stewardship and goal to minimize risk for developing antibiotic  resistance.  Will plan to follow up in 6 months with KUB for stone surveillance or sooner if needed. Pt verbalized understanding and agreement. All questions were answered.  PLAN Advised the following: Maintain adequate fluid intake. Low oxalate diet. Return in about 6 months (around 12/04/2023) for KUB, UA, & f/u with Evette Georges NP.  Orders Placed This Encounter  Procedures   DG Abd 1 View    Standing Status:   Future    Standing Expiration Date:   06/02/2024    Order Specific Question:   Reason for Exam (SYMPTOM  OR DIAGNOSIS REQUIRED)    Answer:   kidney stone    Order Specific Question:   Preferred imaging location?    Answer:   [facility]   Urinalysis, Routine w reflex microscopic   Calculi, with Photograph (to Clinical Lab)    It has been explained  that the patient is to follow regularly with their PCP in addition to all other providers involved in their care and to follow instructions provided by these respective offices. Patient advised to contact urology clinic if any urologic-pertaining questions, concerns, new symptoms or problems arise in the interim period.  Patient Instructions  >80% of stones are calcium oxalate. This type of stones forms when body either isn't clearing oxalate well enough, is making too much oxalate, or too little citrate. This results in oxalate binding to form crystals, which continue to aggregate and form stones.  Limiting calcium does not help, but limiting oxalate in the diet can help. Increasing citric acid intake may also help.  The following measures may help to prevent the recurrence of stones: Increase water intake to 2-2.5 liters per day May add citrus juice (lemon, lime or orange juice) to water Moderation in dairy foods Decrease in salt content 5. Low Oxalate diet: Oxylates are found in foods like Tomato, Spinach, red wine and chocolate (see additional resources below).  Internet resources for information regarding low  oxalate diet: https://kidneystones.yangchunwu.com https://my.VerticalStretch.be  Foods Low in Sodium or Oxalate Foods You Can Eat  Drinks Coffee, fruit and veggie juice (using the recommended veggies), fruit punch  Fruits Apples, apricots (fresh or canned), avocado, bananas, cherries (sweet), cranberries, grapefruit, red or green grapes, lemon and lime juice, melons, nectarines, papayas, peaches, pears, pineapples, oranges, strawberries (fresh), tangerines  Veggies Artichokes, asparagus, bamboo shoots, broccoli, brussels sprouts, cabbage, cauliflower, chayote squash, chicory, corn, cucumbers, endive, lettuce, lima beans, mushrooms, onions, peas, peppers, potatoes, radishes, rutabagas, zucchini  Breads, Cereals, Grains Egg noodles, rye bread, cooked and dry cereals without nuts or bran, crackers with unsalted tops, white or wild rice  Meat, Meat Replacements, Fish, Recruitment consultant, fish, poultry, eggs, egg whites, egg replacements  Soup Homemade soup (using the recommended veggies and meat), low-sodium bouillon, low-sodium canned  Desserts Cookies, cakes, ice cream, pudding without chocolate or nuts, candy without chocolate or nuts  Fats and Oils Butter, margarine, cream, oil, salad dressing, mayo  Other Foods Unsalted potato chips or pretzels, herbs (like garlic, garlic powder, onion powder), lemon juice, salt-free seasoning blends, vinegar  Other Foods Low in Oxalate Foods You Can Eat  Drinks Beer, cola, wine, buttermilk, lemonade or limeade (without added vitamin C), milk  Meat, Meat Replacements, Fish, [employer] meat, ham, bacon, hot dogs, bratwurst, sausage, chicken nuggets, cheddar cheese, canned fish and shellfish  Soup Tomato soup, cheese soup  Other Foods Coconuts, lemon or lime juices, sugar or sweeteners, jellies or jams (from the recommended list)   Moderate-Oxalate Foods Foods to Limit    Drinks Fruit and veggie juices (from the list below), chocolate milk, rice milk, hot cocoa, tea   Fruits Blackberries, blueberries, black currants, cherries (sour), fruit cocktail, mangoes, orange peel, prunes, purple plums   Veggies Baked beans, carrots, celery, green beans, parsnips, summer squash, tomatoes, turnips   Breads, Cereals, Grains White bread, cornbread or cornmeal, white English muffins, saltine or soda crackers, brown rice, vanilla wafers, spaghetti and other noodles, firm tofu, bagels, oatmeal   Meat/meat replacements, fish, poultry Sardines   Desserts Chocolate cake   Fats and Oils Macadamia nuts, pistachio nuts, English walnuts   Other Foods Jams or jellies (made with the fruits above), pepper    High-Oxalate Foods Foods to Avoid Drinks Chocolate drink mixes, soy milk, Ovaltine, instant iced tea, fruit juices of fruits listed below Fruits Apricots (dried), red currants, figs, kiwi, plums, rhubarb Veggies Beans (wax, dried),  beets and beet greens, chives, collard greens, eggplant, escarole, dark greens of all kinds, leeks, okra, parsley, rutabagas, spinach, Swiss chard, tomato paste, watercress Breads, Cereals, Grains Amaranth, barley, white corn flour, fried potatoes, fruitcake, grits, soybean products, sweet potatoes, wheat germ and bran, buckwheat flour, All Bran cereal, graham crackers, pretzels, whole wheat bread Meat/meat replacements, fish, poultry  Dried beans, peanut butter, soy burgers, miso  Desserts Carob, chocolate, marmalades Fats and Oils Nuts (peanuts, almonds, pecans, cashews, hazelnuts), nut butters, sesame seeds, tahini paste Other Foods Poppy seeds   Electronically signed by:  Donnita Falls, MSN, FNP-C, CUNP 06/03/2023 11:52 AM

## 2023-06-03 ENCOUNTER — Ambulatory Visit (INDEPENDENT_AMBULATORY_CARE_PROVIDER_SITE_OTHER): Payer: Medicare Other | Admitting: Urology

## 2023-06-03 ENCOUNTER — Telehealth: Payer: Self-pay

## 2023-06-03 ENCOUNTER — Encounter: Payer: Self-pay | Admitting: Urology

## 2023-06-03 ENCOUNTER — Ambulatory Visit (HOSPITAL_COMMUNITY)
Admission: RE | Admit: 2023-06-03 | Discharge: 2023-06-03 | Disposition: A | Discharge status: Home / Self Care | Payer: Medicare Other | Source: Ambulatory Visit | Attending: Urology | Admitting: Urology

## 2023-06-03 VITALS — BP 171/79 | HR 80 | Temp 97.5°F

## 2023-06-03 DIAGNOSIS — Z09 Encounter for follow-up examination after completed treatment for conditions other than malignant neoplasm: Secondary | ICD-10-CM

## 2023-06-03 DIAGNOSIS — N200 Calculus of kidney: Secondary | ICD-10-CM | POA: Diagnosis not present

## 2023-06-03 DIAGNOSIS — Z87442 Personal history of urinary calculi: Secondary | ICD-10-CM

## 2023-06-03 DIAGNOSIS — R8271 Bacteriuria: Secondary | ICD-10-CM

## 2023-06-03 LAB — URINALYSIS, ROUTINE W REFLEX MICROSCOPIC
Bilirubin, UA: NEGATIVE
Glucose, UA: NEGATIVE
Ketones, UA: NEGATIVE
Nitrite, UA: POSITIVE — AB
RBC, UA: NEGATIVE
Specific Gravity, UA: 1.03 (ref 1.005–1.030)
Urobilinogen, Ur: 0.2 mg/dL (ref 0.2–1.0)
pH, UA: 6 (ref 5.0–7.5)

## 2023-06-03 LAB — MICROSCOPIC EXAMINATION
Epithelial Cells (non renal): >10 /hpf — AB (ref 0–10)
WBC, UA: >30 /hpf — AB (ref 0–5)

## 2023-06-03 NOTE — Telephone Encounter (Signed)
Tried calling patient with no answer, left vm to remind patient  to get her KUB before scheduled appointment today.

## 2023-06-03 NOTE — Patient Instructions (Signed)

## 2023-06-16 ENCOUNTER — Telehealth: Payer: Self-pay

## 2023-06-16 LAB — CALCULI, WITH PHOTOGRAPH (CLINICAL LAB)
Calcium Oxalate Dihydrate: 20 %
Calcium Oxalate Monohydrate: 80 %
Weight Calculi: 12 mg

## 2023-06-16 NOTE — Telephone Encounter (Signed)
-----   Message from Donnita Falls sent at 06/16/2023  8:41 AM EDT -----  Please let pt know stone analysis showed 100% calcium oxalate composition. Please advise adequate fluid intake (around 2 liters per day) and low oxalate diet for stone prevention.

## 2023-06-16 NOTE — Telephone Encounter (Signed)
Tried calling patient with no answer, left vm for return call to office. Letter mailed out with Sarah response and low oxalate palate.

## 2023-07-25 ENCOUNTER — Ambulatory Visit (INDEPENDENT_AMBULATORY_CARE_PROVIDER_SITE_OTHER): Payer: Medicare Other | Admitting: Audiology

## 2023-07-25 DIAGNOSIS — H903 Sensorineural hearing loss, bilateral: Secondary | ICD-10-CM

## 2023-07-25 NOTE — Progress Notes (Signed)
Patient was seen today for a hearing aid check. She reported intermittent sound quality in the right hearing aid. Patient was concerned she may have a bad batch of batteries. Connected both hearing aids to the software and checked for firmware updates and battery drainage notices; both hearing aids were up to date. Otoscopy revealed a clear canal in the left ear and occluded wax in the right ear. Discussed how the wax can cause the hearing aid sound to become intermittent. Patient scheduled for a wax removal appointment with an MD. She also discussed an appointment in the future for new hearing aids once we have pricing and her insurance is updated. Instructed her to call if any concerns arise.  Christine Newman Marshelle Bilger, AUD, CCC-A 07/25/23

## 2023-08-02 ENCOUNTER — Ambulatory Visit (INDEPENDENT_AMBULATORY_CARE_PROVIDER_SITE_OTHER): Payer: Medicare Other

## 2023-08-02 ENCOUNTER — Ambulatory Visit (INDEPENDENT_AMBULATORY_CARE_PROVIDER_SITE_OTHER): Payer: Medicare Other | Admitting: Audiology

## 2023-08-02 ENCOUNTER — Encounter (INDEPENDENT_AMBULATORY_CARE_PROVIDER_SITE_OTHER): Payer: Self-pay

## 2023-08-02 VITALS — Ht 66.0 in | Wt 140.0 lb

## 2023-08-02 DIAGNOSIS — H6121 Impacted cerumen, right ear: Secondary | ICD-10-CM | POA: Diagnosis not present

## 2023-08-02 DIAGNOSIS — H903 Sensorineural hearing loss, bilateral: Secondary | ICD-10-CM | POA: Diagnosis not present

## 2023-08-02 NOTE — Progress Notes (Signed)
[address] [place], [place] [address] [phone]  Audiological Evaluation    Name: Christine Newman     DOB:   May 13, 1941      MRN:   [id number]                                                                                     Service Date: 08/02/2023        Patient was referred today for a hearing evaluation by Dr. Karle Barr.   Symptoms Yes Details  Hearing loss  [x]  Patient reported perceiving hearing loss in both ears.  Tinnitus  []  Patient denied experiencing tinnitus.  Balance problems  []  Patient denied vertigo/imbalance sensations.  Previous ear surgeries  []  Patient denied any previous ear surgeries.  Family history  []  Patient denied family history of hearing loss.  Amplification  [x]  Patient reported use of hearing aids in both ears.    Tympanogram: Right ear: Normal external ear canal volume with normal middle ear pressure and tympanic membrane compliance (Type A). Left ear: Normal external ear canal volume with normal middle ear pressure and tympanic membrane compliance (Type A).    Hearing Evaluation: The audiogram was completed using conventional audiometric techniques under headphones with good reliability.   The hearing test results indicate: Right ear: Mild hearing loss at 250 Hz gradually sloping to severe sensorineural hearing loss from [phone] Hz. Left ear: Mild hearing loss at 250 Hz gradually sloping to severe sensorineural hearing loss from [phone] Hz.  Speech Audiometry: Right ear- Speech Reception Threshold (SRT) was obtained at 60 dBHL. Left ear-Speech Reception Threshold (SRT) was obtained at 60 dBHL.   Word Recognition Score Tested using NU-6 (MLV) Right ear: 76% was obtained at a presentation level of 70 dBHL which is deemed as fair understanding. Left ear: 72% was obtained at a presentation level of 70 dBHL which is deemed as fair understanding.    Impression: There is not a significant difference in puretone  thresholds and word recognition scores between the ears.   Recommendations: Repeat audiogram when changes are perceived or per MD. Continue use of hearing aids.   Conley Rolls Rogerick Baldwin, AUD, CCC-A 08/02/23

## 2023-08-04 DIAGNOSIS — H903 Sensorineural hearing loss, bilateral: Secondary | ICD-10-CM | POA: Insufficient documentation

## 2023-08-04 DIAGNOSIS — H6121 Impacted cerumen, right ear: Secondary | ICD-10-CM | POA: Insufficient documentation

## 2023-08-04 NOTE — Progress Notes (Signed)
Patient ID: Christine Newman, female   DOB: [birth year], 82 y.o.   MRN: [id number]  Cc: Decreased right ear hearing  HPI: The patient is an 82 year old female who presents today complaining of right ear hearing difficulty for the past few weeks.  The patient has a history of bilateral high-frequency sensorineural hearing loss.  She was fitted with bilateral hearing aids.  According to the patient, she was doing well until recently, when she started noticing progressive right ear hearing difficulty.  She denies any significant otalgia, otorrhea, or vertigo.  She has no recent otitis media or otitis externa.  Exam: General: Communicates without difficulty, well nourished, no acute distress. Head: Normocephalic, no evidence injury, no tenderness, facial buttresses intact without stepoff. Face/sinus: No tenderness to palpation and percussion. Facial movement is normal and symmetric. Eyes: PERRL, EOMI. No scleral icterus, conjunctivae clear. Neuro: CN II exam reveals vision grossly intact.  No nystagmus at any point of gaze. Ears: Auricles well formed without lesions.  Right ear cerumen impaction.  Nose: External evaluation reveals normal support and skin without lesions.  Dorsum is intact.  Anterior rhinoscopy reveals normal mucosa over anterior aspect of inferior turbinates and intact septum.  No purulence noted. Oral:  Oral cavity and oropharynx are intact, symmetric, without erythema or edema.  Mucosa is moist without lesions. Neck: Full range of motion without pain.  There is no significant lymphadenopathy.  No masses palpable.  Thyroid bed within normal limits to palpation.  Parotid glands and submandibular glands equal bilaterally without mass.  Trachea is midline. Neuro:  CN 2-12 grossly intact.    Procedure: Right ear cerumen disimpaction Anesthesia: None Description: Under the operating microscope, the cerumen is carefully removed with a combination of cerumen currette, alligator forceps, and suction  catheters.  After the cerumen is removed, the TMs are noted to be normal.  No mass, erythema, or lesions. The patient tolerated the procedure well.    Her hearing test shows stable bilateral high-frequency sensorineural hearing loss.  Assessment: 1.  Right ear cerumen impaction, causing transient right ear conductive hearing loss. 2.  After the cerumen disimpaction procedure, the patient is noted to have stable bilateral high-frequency sensorineural hearing loss. 3.  Her ear canals, tympanic membranes, and middle ear spaces are otherwise normal.  Plan: 1.  Otomicroscopy with right ear cerumen disimpaction. 2.  The physical exam findings and the hearing test results are reviewed with the patient. 3.  Continue the use of her hearing aids. 4.  The patient is encouraged to call with any questions or concerns.

## 2023-08-04 NOTE — Addendum Note (Signed)
Addended byNewman Pies on: 08/04/2023 10:55 AM   Modules accepted: Orders

## 2023-08-10 DIAGNOSIS — Z0000 Encounter for general adult medical examination without abnormal findings: Secondary | ICD-10-CM | POA: Diagnosis not present

## 2023-08-10 DIAGNOSIS — Z79899 Other long term (current) drug therapy: Secondary | ICD-10-CM | POA: Diagnosis not present

## 2023-08-10 DIAGNOSIS — I129 Hypertensive chronic kidney disease with stage 1 through stage 4 chronic kidney disease, or unspecified chronic kidney disease: Secondary | ICD-10-CM | POA: Diagnosis not present

## 2023-08-10 DIAGNOSIS — N184 Chronic kidney disease, stage 4 (severe): Secondary | ICD-10-CM | POA: Diagnosis not present

## 2023-08-10 DIAGNOSIS — E559 Vitamin D deficiency, unspecified: Secondary | ICD-10-CM | POA: Diagnosis not present

## 2023-08-10 DIAGNOSIS — E7800 Pure hypercholesterolemia, unspecified: Secondary | ICD-10-CM | POA: Diagnosis not present

## 2023-08-10 DIAGNOSIS — Z6822 Body mass index (BMI) 22.0-22.9, adult: Secondary | ICD-10-CM | POA: Diagnosis not present

## 2023-08-10 DIAGNOSIS — K5090 Crohn's disease, unspecified, without complications: Secondary | ICD-10-CM | POA: Diagnosis not present

## 2023-08-17 ENCOUNTER — Encounter: Payer: Self-pay | Admitting: Audiology

## 2023-08-29 DIAGNOSIS — M1712 Unilateral primary osteoarthritis, left knee: Secondary | ICD-10-CM | POA: Diagnosis not present

## 2023-11-08 NOTE — Progress Notes (Signed)
 Name: Christine Newman DOB: 1941-08-24 MRN: [id number]  History of Present Illness: Christine Newman is a 83 y.o. female who presents today for follow up visit at [facility] [place]. - GU History: 1. Kidney stones. - 05/10/2023: Underwent left ESWL procedure by Dr. Sherrilee.  At last visit on 06/03/2023: Doing well.   Today: KUB today: Awaiting radiology read; left UPJ stone appreciated per provider interpretation.  She was seen by her GYN provider on 11/01/2023 for evaluation of what she thought was vaginal bleeding. Per that visit note however there was no blood in vaginal vault. Small hemorrhoids were noted. Discussed possible kidney stone.  Patient reports that she started noticing pink blood when wiping after using the bathroom in December 2024 and it has been intermittent since then; denies gross hematuria. She denies any known recent stone passage and denies any acute flank pain or abdominal pain. Denies fevers, nausea, or vomiting. Denies increased urinary urgency, frequency, dysuria, hesitancy, straining to void, or sensations of incomplete emptying.   Fall Screening: Do you usually have a device to assist in your mobility? No   Medications: Current Outpatient Medications  Medication Sig Dispense Refill   amLODipine  (NORVASC ) 10 MG tablet TAKE (1) TABLET BY MOUTH AT BEDTIME. 90 tablet 0   cholecalciferol (VITAMIN D) 1000 units tablet Take 1,000 Units by mouth daily.     folic acid (FOLVITE) 400 MCG tablet Take 400 mcg by mouth daily.     Multiple Vitamins-Minerals (MULTIVITAMIN WITH MINERALS) tablet Take 1 tablet by mouth daily.     Potassium Citrate  (UROCIT-K  15) 15 MEQ (1620 MG) TBCR Take 1 tablet by mouth 2 (two) times daily. 180 tablet 3   spironolactone (ALDACTONE) 25 MG tablet Take by mouth.     vitamin B-12 (CYANOCOBALAMIN) 1000 MCG tablet Take 1,000 mcg by mouth daily.     cloNIDine  (CATAPRES ) 0.1 MG tablet Take 1 tablet (0.1 mg total) by mouth 2 (two)  times daily. (Patient not taking: Reported on 11/17/2023) 180 tablet 1   HYDROcodone -acetaminophen  (NORCO) 5-325 MG tablet Take 1 tablet by mouth every 6 (six) hours as needed for moderate pain. (Patient not taking: Reported on 11/17/2023) 30 tablet 0   ondansetron  (ZOFRAN ) 4 MG tablet Take 1 tablet (4 mg total) by mouth daily as needed for nausea or vomiting. (Patient not taking: Reported on 11/17/2023) 30 tablet 1   tamsulosin  (FLOMAX ) 0.4 MG CAPS capsule Take 1 capsule (0.4 mg total) by mouth daily after supper. 30 capsule 0   No current facility-administered medications for this visit.    Allergies: Allergies  Allergen Reactions   Metoprolol      Bradycardia    Past Medical History:  Diagnosis Date   Anal polyp 2005   condyloma acuminatum   Anemia    CKD (chronic kidney disease)    Cr 1.9 -09/2014   Colonic mass    Diverticulitis    with abscess   History of kidney stones    Hypertension    Past Surgical History:  Procedure Laterality Date   anal polypectomy  01/02/2004   Dr.Rosenbower- anal polyp removed. bx= condyloma acuminatum   CATARACT EXTRACTION W/PHACO Left 08/10/2018   Procedure: CATARACT EXTRACTION PHACO AND INTRAOCULAR LENS PLACEMENT LEFT EYE;  Surgeon: Perley Hamilton, MD;  Location: [facility];  Service: Ophthalmology;  Laterality: Left;  left   CATARACT EXTRACTION W/PHACO Right 09/04/2018   Procedure: CATARACT EXTRACTION PHACO AND INTRAOCULAR LENS PLACEMENT (IOC);  Surgeon: Perley Hamilton, MD;  Location: [facility];  Service: Ophthalmology;  Laterality: Right;  CDE: 11.16   COLONOSCOPY  04/08/2003   Dr.Mann- small nonbleeding internal and external hemorrhoids small polypoid mass at anal verge, normal appearing L colon, transverse colon, R colon including cecum. stenotic ileocecal valve. bx of maa= benign rectal type mucosa with features of mucosal prolapse syndrome   COLONOSCOPY N/A 04/02/2015   Dr.Rourk- colonic diverticulosis, no evidence of colonic neoplasm. abnormal ileocecal  valve with ulceration bx= ulcerated/eroded benign ileocecal valve mucosa   COLONOSCOPY N/A 06/14/2018   scattered diverticula in sigmoid and descending colon. Distal 10 cm of neoterminal ileum appears normal. Appears to have surgical remission   EXTRACORPOREAL SHOCK WAVE LITHOTRIPSY Left 05/10/2023   Procedure: EXTRACORPOREAL SHOCK WAVE LITHOTRIPSY (ESWL);  Surgeon: Sherrilee Belvie CROME, MD;  Location: [facility];  Service: Urology;  Laterality: Left;   FLEXIBLE SIGMOIDOSCOPY  11/06/2003   Dr.Mann- small nodular mass at anal verge, bx done. early L side diverticula. bx= benign rectal mucosa with features of mucosal prolapse syndrome. no adenomatous changes or evidence of malignancy identified.    IR NEPHROSTOMY PLACEMENT LEFT  01/05/2018   KIDNEY STONE SURGERY     LAPAROSCOPY N/A 11/02/2017   Procedure: LAPAROSCOPIC ILEOCECECTOMY;  Surgeon: Debby Hila, MD;  Location: [facility];  Service: General;  Laterality: N/A;   NEPHROLITHOTOMY Left 02/13/2018   Procedure: NEPHROLITHOTOMY PERCUTANEOUS;  Surgeon: Sherrilee Belvie CROME, MD;  Location: [facility];  Service: Urology;  Laterality: Left;   Family History  Problem Relation Age of Onset   Hypertension Mother    Heart disease Father    Cancer Sister        breast   Colon cancer Neg Hx    Colon polyps Neg Hx    Social History   Socioeconomic History   Marital status: Widowed    Spouse name: Not on file   Number of children: Not on file   Years of education: Not on file   Highest education level: Not on file  Occupational History   Not on file  Tobacco Use   Smoking status: Former    Current packs/day: 0.00    Average packs/day: 0.3 packs/day for 5.0 years (1.3 ttl pk-yrs)    Types: Cigarettes    Start date: 01/18/1957    Quit date: 01/18/1962    Years since quitting: 61.8   Smokeless tobacco: Never   Tobacco comments:    1 pack cigarettes per week ; quit in her early 75s   Vaping Use   Vaping status: Never Used  Substance and Sexual Activity    Alcohol use: No   Drug use: Never   Sexual activity: Not Currently    Birth control/protection: Post-menopausal  Other Topics Concern   Not on file  Social History Narrative   Not on file   Social Drivers of Health   Financial Resource Strain: Low Risk  (11/01/2023)   Received from [facility]   Overall Financial Resource Strain (CARDIA)    Difficulty of Paying Living Expenses: Not hard at all  Food Insecurity: No Food Insecurity (11/01/2023)   Received from [facility]   Hunger Vital Sign    Worried About Running Out of Food in the Last Year: Never true    Ran Out of Food in the Last Year: Never true  Transportation Needs: No Transportation Needs (11/01/2023)   Received from [facility] - Transportation    Lack of Transportation (Medical): No    Lack of Transportation (Non-Medical): No  Physical Activity: Insufficiently  Active (08/10/2023)   Received from [facility]   Exercise Vital Sign    Days of Exercise per Week: 3 days    Minutes of Exercise per Session: 30 min  Stress: Stress Concern Present (08/10/2023)   Received from [facility] - Occupational Stress Questionnaire    Feeling of Stress : To some extent  Social Connections: Moderately Integrated (08/10/2023)   Received from [facility]   Social Network    How would you rate your social network (family, work, friends)?: Adequate participation with social networks  Intimate Partner Violence: Not At Risk (08/10/2023)   Received from [facility]   HITS    Over the last 12 months how often did your partner physically hurt you?: Never    Over the last 12 months how often did your partner insult you or talk down to you?: Never    Over the last 12 months how often did your partner threaten you with physical harm?: Never    Over the last 12 months how often did your partner scream or curse at you?: Never    SUBJECTIVE  Review of Systems Constitutional:  Patient denies any unintentional weight loss or change in strength lntegumentary: Patient denies any rashes or pruritus Cardiovascular: Patient denies chest pain or syncope Respiratory: Patient denies shortness of breath Gastrointestinal: Patient denies nausea, vomiting, constipation, or diarrhea Musculoskeletal: Patient denies muscle cramps or weakness Neurologic: Patient denies convulsions or seizures Allergic/Immunologic: Patient denies recent allergic reaction(s) Hematologic/Lymphatic: Patient denies bleeding tendencies Endocrine: Patient denies heat/cold intolerance  GU: As per HPI.  OBJECTIVE Vitals:   11/17/23 1305  BP: (!) 168/80  Pulse: 68   There is no height or weight on file to calculate BMI.  Physical Examination Constitutional: No obvious distress; patient is non-toxic appearing  Cardiovascular: No visible lower extremity edema.  Respiratory: The patient does not have audible wheezing/stridor; respirations do not appear labored  Gastrointestinal: Abdomen non-distended Musculoskeletal: Normal ROM of UEs  Skin: No obvious rashes/open sores  Neurologic: CN 2-12 grossly intact Psychiatric: Answered questions appropriately with anxious affect  Hematologic/Lymphatic/Immunologic: No obvious bruises or sites of spontaneous bleeding  Urine microscopy: 6-10 WBC/hpf, 11-30 RBC/hpf, few bacteria  ASSESSMENT Kidney stones - Plan: Urinalysis, Routine w reflex microscopic, tamsulosin  (FLOMAX ) 0.4 MG CAPS capsule, DG Abd 1 View  Abnormal urinalysis  Left ureteral stone  We reviewed recent imaging results; awaiting radiology results, appears to have a 6 mm left UPJ stone per provider interpretation today which is the likely cause for her recent intermittent pink-tinged urine. She is currently asymptomatic at this time. Advised Flomax  0.4 mg daily for medical expulsive therapy (MET). If stone fails to progress, will likely plan for ESWL at follow up. Will plan to follow up in 2  weeks with KUB for stone recheck. She verbalized understanding and agreement. All questions were answered.  PLAN Advised the following: Flomax  daily x2 weeks. Analgesics PRN for pain. Return in about 2 weeks (around 12/01/2023) for KUB, UA, & f/u with Lauraine Oz NP.  Orders Placed This Encounter  Procedures   DG Abd 1 View    Standing Status:   Future    Expected Date:   12/01/2023    Expiration Date:   11/16/2024    Reason for Exam (SYMPTOM  OR DIAGNOSIS REQUIRED):   kidney stone    Preferred imaging location?:   [facility]   Urinalysis, Routine w reflex microscopic    It has been  explained that the patient is to follow regularly with their PCP in addition to all other providers involved in their care and to follow instructions provided by these respective offices. Patient advised to contact urology clinic if any urologic-pertaining questions, concerns, new symptoms or problems arise in the interim period.  There are no Patient Instructions on file for this visit.  Electronically signed by:  Lauraine KYM Oz, MSN, FNP-C, CUNP 11/17/2023 1:57 PM

## 2023-11-16 ENCOUNTER — Ambulatory Visit (HOSPITAL_COMMUNITY)
Admission: RE | Admit: 2023-11-16 | Discharge: 2023-11-16 | Disposition: A | Discharge status: Home / Self Care | Payer: Medicare Other | Source: Ambulatory Visit | Attending: Urology | Admitting: Urology

## 2023-11-16 DIAGNOSIS — N200 Calculus of kidney: Secondary | ICD-10-CM | POA: Diagnosis present

## 2023-11-17 ENCOUNTER — Ambulatory Visit: Payer: Medicare Other | Admitting: Urology

## 2023-11-17 ENCOUNTER — Encounter: Payer: Self-pay | Admitting: Urology

## 2023-11-17 VITALS — BP 168/80 | HR 68

## 2023-11-17 DIAGNOSIS — R8290 Unspecified abnormal findings in urine: Secondary | ICD-10-CM

## 2023-11-17 DIAGNOSIS — N201 Calculus of ureter: Secondary | ICD-10-CM | POA: Diagnosis not present

## 2023-11-17 DIAGNOSIS — N200 Calculus of kidney: Secondary | ICD-10-CM | POA: Insufficient documentation

## 2023-11-17 LAB — URINALYSIS, ROUTINE W REFLEX MICROSCOPIC
Bilirubin, UA: NEGATIVE
Glucose, UA: NEGATIVE
Ketones, UA: NEGATIVE
Nitrite, UA: NEGATIVE
Specific Gravity, UA: 1.025 (ref 1.005–1.030)
Urobilinogen, Ur: 0.2 mg/dL (ref 0.2–1.0)
pH, UA: 6 (ref 5.0–7.5)

## 2023-11-17 LAB — MICROSCOPIC EXAMINATION

## 2023-11-17 MED ORDER — TAMSULOSIN HCL 0.4 MG PO CAPS: 0.4000 mg | ORAL_CAPSULE | Freq: Every day | ORAL | 0 refills | Status: DC

## 2023-11-25 LAB — LAB REPORT - SCANNED
Calcium: 10.1
EGFR: 28
PTH: 41

## 2023-11-30 ENCOUNTER — Ambulatory Visit (HOSPITAL_COMMUNITY)
Admission: RE | Admit: 2023-11-30 | Discharge: 2023-11-30 | Disposition: A | Discharge status: Home / Self Care | Payer: Medicare Other | Source: Ambulatory Visit | Attending: Urology | Admitting: Urology

## 2023-11-30 DIAGNOSIS — N200 Calculus of kidney: Secondary | ICD-10-CM | POA: Insufficient documentation

## 2023-12-01 ENCOUNTER — Ambulatory Visit: Payer: Medicare Other | Admitting: Urology

## 2023-12-06 ENCOUNTER — Ambulatory Visit: Payer: Medicare Other | Admitting: Urology

## 2023-12-14 ENCOUNTER — Ambulatory Visit: Admitting: Urology

## 2023-12-14 ENCOUNTER — Ambulatory Visit: Payer: Medicare Other | Admitting: Urology

## 2023-12-14 NOTE — Progress Notes (Deleted)
 Name: Christine Newman DOB: 1941/05/07 MRN: [id number]  History of Present Illness: Christine Newman is a 83 y.o. female who presents today for follow up visit at [facility] [place]. - GU History: 1. Kidney stones. - 05/10/2023: Underwent left ESWL procedure by Dr. Ronne Binning.   At last visit on 11/17/2023: - Reported concern for possible stone due to intermittently seeing "pink" blood on toilet paper after voiding since December 2024. Denies gross hematuria. Saw GYN provider on 11/01/2023; exam negative for evidence of vaginal bleeding. She was otherwise asymptomatic for stone (no flank / abdominal pain; no acute urinary symptoms). - Urine microscopy: 6-10 WBC/hpf, 11-30 RBC/hpf, few bacteria.  - Had KUB on 11/16/2023 which had not yet been evaluated by radiologist at the time of this visit; discussed possible  left UPJ stone per urology NP image review. Plan was Flomax for MET x2 weeks.  - Radiology report for the KUB later stated: "Redemonstration of a 3 x 6 mm calcification overlying the left renal shadow, similar to the prior study from 05/10/2023. There is an additional 5 x 7 mm calcification overlying the left transverse process of L2 vertebra, which is also similar to the prior study from 05/10/2023. There are several sub 4 mm calcifications overlying the pelvic region, also unchanged."  Since last visit: > KUB on 11/30/2023: Awaiting radiology read; stable compared to prior appreciated per provider interpretation.  Today: She {Actions; denies-reports:120008} recent stone passage. She {Actions; denies-reports:120008} flank pain or abdominal pain. She {Actions; denies-reports:120008} fevers, nausea, or vomiting.  She {Actions; denies-reports:120008} increased urinary urgency, frequency, nocturia, dysuria, gross hematuria, hesitancy, straining to void, or sensations of incomplete emptying.  She {Actions; denies-reports:120008} vaginal pain, bleeding, abnormal discharge, itching,  dryness.  ***She was advised that KUB has shown no evidence of ureteral stone. We discussed that the intermittent "pink" blood on toilet paper after voiding and ***abnormal UA are likely attributable to post-menopausal atrophic changes of the vaginal mucosa.  ***Could consider I&O cath to obtain sterile urine sample for further evaluation; patient **declined.  ***Could consider CT if stone symptoms now or + UA ***if symptomatic for vaginal atrophy, start topical vaginal estrogen cream   Medications: Current Outpatient Medications  Medication Sig Dispense Refill   amLODipine (NORVASC) 10 MG tablet TAKE (1) TABLET BY MOUTH AT BEDTIME. 90 tablet 0   cholecalciferol (VITAMIN D) 1000 units tablet Take 1,000 Units by mouth daily.     cloNIDine (CATAPRES) 0.1 MG tablet Take 1 tablet (0.1 mg total) by mouth 2 (two) times daily. (Patient not taking: Reported on 11/17/2023) 180 tablet 1   folic acid (FOLVITE) 400 MCG tablet Take 400 mcg by mouth daily.     HYDROcodone-acetaminophen (NORCO) 5-325 MG tablet Take 1 tablet by mouth every 6 (six) hours as needed for moderate pain. (Patient not taking: Reported on 11/17/2023) 30 tablet 0   Multiple Vitamins-Minerals (MULTIVITAMIN WITH MINERALS) tablet Take 1 tablet by mouth daily.     ondansetron (ZOFRAN) 4 MG tablet Take 1 tablet (4 mg total) by mouth daily as needed for nausea or vomiting. (Patient not taking: Reported on 11/17/2023) 30 tablet 1   Potassium Citrate (UROCIT-K 15) 15 MEQ (1620 MG) TBCR Take 1 tablet by mouth 2 (two) times daily. 180 tablet 3   spironolactone (ALDACTONE) 25 MG tablet Take by mouth.     tamsulosin (FLOMAX) 0.4 MG CAPS capsule Take 1 capsule (0.4 mg total) by mouth daily after supper. 30 capsule 0   vitamin B-12 (CYANOCOBALAMIN) 1000 MCG  tablet Take 1,000 mcg by mouth daily.     No current facility-administered medications for this visit.    Allergies: Allergies  Allergen Reactions   Metoprolol     Bradycardia    Past  Medical History:  Diagnosis Date   Anal polyp 2005   condyloma acuminatum   Anemia    CKD (chronic kidney disease)    Cr 1.9 -09/2014   Colonic mass    Diverticulitis    with abscess   History of kidney stones    Hypertension    Past Surgical History:  Procedure Laterality Date   anal polypectomy  01/02/2004   Dr.Rosenbower- anal polyp removed. bx= condyloma acuminatum   CATARACT EXTRACTION W/PHACO Left 08/10/2018   Procedure: CATARACT EXTRACTION PHACO AND INTRAOCULAR LENS PLACEMENT LEFT EYE;  Surgeon: Gemma Payor, MD;  Location: [facility];  Service: Ophthalmology;  Laterality: Left;  left   CATARACT EXTRACTION W/PHACO Right 09/04/2018   Procedure: CATARACT EXTRACTION PHACO AND INTRAOCULAR LENS PLACEMENT (IOC);  Surgeon: Gemma Payor, MD;  Location: [facility];  Service: Ophthalmology;  Laterality: Right;  CDE: 11.16   COLONOSCOPY  04/08/2003   Dr.Mann- small nonbleeding internal and external hemorrhoids small polypoid mass at anal verge, normal appearing L colon, transverse colon, R colon including cecum. stenotic ileocecal valve. bx of maa= benign rectal type mucosa with features of mucosal prolapse syndrome   COLONOSCOPY N/A 04/02/2015   Dr.Rourk- colonic diverticulosis, no evidence of colonic neoplasm. abnormal ileocecal valve with ulceration bx= ulcerated/eroded benign ileocecal valve mucosa   COLONOSCOPY N/A 06/14/2018   scattered diverticula in sigmoid and descending colon. Distal 10 cm of neoterminal ileum appears normal. Appears to have surgical remission   EXTRACORPOREAL SHOCK WAVE LITHOTRIPSY Left 05/10/2023   Procedure: EXTRACORPOREAL SHOCK WAVE LITHOTRIPSY (ESWL);  Surgeon: Malen Gauze, MD;  Location: [facility];  Service: Urology;  Laterality: Left;   FLEXIBLE SIGMOIDOSCOPY  11/06/2003   Dr.Mann- small nodular mass at anal verge, bx done. early L side diverticula. bx= benign rectal mucosa with features of mucosal prolapse syndrome. no adenomatous changes or evidence of malignancy  identified.    IR NEPHROSTOMY PLACEMENT LEFT  01/05/2018   KIDNEY STONE SURGERY     LAPAROSCOPY N/A 11/02/2017   Procedure: LAPAROSCOPIC ILEOCECECTOMY;  Surgeon: Romie Levee, MD;  Location: [facility];  Service: General;  Laterality: N/A;   NEPHROLITHOTOMY Left 02/13/2018   Procedure: NEPHROLITHOTOMY PERCUTANEOUS;  Surgeon: Malen Gauze, MD;  Location: [facility];  Service: Urology;  Laterality: Left;   Family History  Problem Relation Age of Onset   Hypertension Mother    Heart disease Father    Cancer Sister        breast   Colon cancer Neg Hx    Colon polyps Neg Hx    Social History   Socioeconomic History   Marital status: Widowed    Spouse name: Not on file   Number of children: Not on file   Years of education: Not on file   Highest education level: Not on file  Occupational History   Not on file  Tobacco Use   Smoking status: Former    Current packs/day: 0.00    Average packs/day: 0.3 packs/day for 5.0 years (1.3 ttl pk-yrs)    Types: Cigarettes    Start date: 01/18/1957    Quit date: 01/18/1962    Years since quitting: 61.9   Smokeless tobacco: Never   Tobacco comments:    1 pack cigarettes per week ; quit in her early  40s   Vaping Use   Vaping status: Never Used  Substance and Sexual Activity   Alcohol use: No   Drug use: Never   Sexual activity: Not Currently    Birth control/protection: Post-menopausal  Other Topics Concern   Not on file  Social History Narrative   Not on file   Social Drivers of Health   Financial Resource Strain: Low Risk  (11/01/2023)   Received from [facility]   Overall Financial Resource Strain (CARDIA)    Difficulty of Paying Living Expenses: Not hard at all  Food Insecurity: No Food Insecurity (11/01/2023)   Received from [facility]   Hunger Vital Sign    Worried About Running Out of Food in the Last Year: Never true    Ran Out of Food in the Last Year: Never true  Transportation Needs: No Transportation Needs (11/01/2023)    Received from [facility] - Transportation    Lack of Transportation (Medical): No    Lack of Transportation (Non-Medical): No  Physical Activity: Insufficiently Active (08/10/2023)   Received from [facility]   Exercise Vital Sign    Days of Exercise per Week: 3 days    Minutes of Exercise per Session: 30 min  Stress: Stress Concern Present (08/10/2023)   Received from [facility] - Occupational Stress Questionnaire    Feeling of Stress : To some extent  Social Connections: Moderately Integrated (08/10/2023)   Received from [facility]   Social Network    How would you rate your social network (family, work, friends)?: Adequate participation with social networks  Intimate Partner Violence: Not At Risk (08/10/2023)   Received from [facility]   HITS    Over the last 12 months how often did your partner physically hurt you?: Never    Over the last 12 months how often did your partner insult you or talk down to you?: Never    Over the last 12 months how often did your partner threaten you with physical harm?: Never    Over the last 12 months how often did your partner scream or curse at you?: Never    SUBJECTIVE  Review of Systems Constitutional: Patient denies any unintentional weight loss or change in strength lntegumentary: Patient denies any rashes or pruritus Cardiovascular: Patient denies chest pain or syncope Respiratory: Patient denies shortness of breath Gastrointestinal: ***Patient {Actions; denies-reports:120008} ***nausea, ***vomiting, ***constipation, ***diarrhea ***As per HPI Musculoskeletal: Patient denies muscle cramps or weakness Neurologic: Patient denies convulsions or seizures Allergic/Immunologic: Patient denies recent allergic reaction(s) Hematologic/Lymphatic: Patient denies bleeding tendencies Endocrine: Patient denies heat/cold intolerance  GU: As per HPI.  OBJECTIVE There were no  vitals filed for this visit. There is no height or weight on file to calculate BMI.  Physical Examination Constitutional: No obvious distress; patient is non-toxic appearing  Cardiovascular: No visible lower extremity edema.  Respiratory: The patient does not have audible wheezing/stridor; respirations do not appear labored  Gastrointestinal: Abdomen non-distended Musculoskeletal: Normal ROM of UEs  Skin: No obvious rashes/open sores  Neurologic: CN 2-12 grossly intact Psychiatric: Answered questions appropriately with normal affect  Hematologic/Lymphatic/Immunologic: No obvious bruises or sites of spontaneous bleeding  UA:  ***positive for *** leukocytes, *** blood, ***nitrites ***Urine microscopy:  ***negative  *** WBC/hpf, *** RBC/hpf, *** bacteria ***with no evidence of UTI ***with no evidence of microscopic hematuria ***otherwise unremarkable ***glucosuria (secondary to ***Jardiance ***Farxiga use)  PVR: *** ml  ASSESSMENT Microscopic hematuria  ***We  reviewed recent imaging results; ***awaiting radiology results, appears to have ***no acute findings per provider interpretation.  ***For stone prevention: Advised adequate hydration and we discussed option to consider low oxalate diet given that calcium oxalate is the most common type of stone. Handout provided about stone prevention diet.  ***For recurrent stone formers: We discussed option to proceed with 24 hour urinalysis ([facility]) for metabolic stone evaluation, which may help with targeted recommendations for dietary I medication therapies for stone prevention. Patient elected to ***proceed/ ***hold off.  Will plan to follow up in ***6 months / ***1 year with ***KUB ***RUS for stone surveillance or sooner if needed.  Patient verbalized understanding of and agreement with current plan. All questions were answered.  PLAN Advised the following: Maintain adequate fluid intake daily. Drink citrus juice (lemon, lime or  orange juice) routinely. Low oxalate diet. No follow-ups on file.  No orders of the defined types were placed in this encounter.   It has been explained that the patient is to follow regularly with their PCP in addition to all other providers involved in their care and to follow instructions provided by these respective offices. Patient advised to contact urology clinic if any urologic-pertaining questions, concerns, new symptoms or problems arise in the interim period.  There are no Patient Instructions on file for this visit.  Electronically signed by:  Donnita Falls, MSN, FNP-C, CUNP 12/14/2023 9:10 AM

## 2023-12-15 ENCOUNTER — Ambulatory Visit (HOSPITAL_COMMUNITY)
Admission: RE | Admit: 2023-12-15 | Discharge: 2023-12-15 | Disposition: A | Discharge status: Home / Self Care | Source: Ambulatory Visit | Attending: Urology | Admitting: Urology

## 2023-12-15 ENCOUNTER — Other Ambulatory Visit: Payer: Self-pay | Admitting: Urology

## 2023-12-15 ENCOUNTER — Other Ambulatory Visit: Payer: Self-pay

## 2023-12-15 DIAGNOSIS — N200 Calculus of kidney: Secondary | ICD-10-CM | POA: Diagnosis present

## 2023-12-16 ENCOUNTER — Ambulatory Visit: Admitting: Urology

## 2023-12-16 ENCOUNTER — Encounter: Payer: Self-pay | Admitting: Urology

## 2023-12-16 VITALS — BP 160/87 | HR 84 | Temp 97.6°F

## 2023-12-16 DIAGNOSIS — R8290 Unspecified abnormal findings in urine: Secondary | ICD-10-CM

## 2023-12-16 DIAGNOSIS — R319 Hematuria, unspecified: Secondary | ICD-10-CM

## 2023-12-16 DIAGNOSIS — Z87891 Personal history of nicotine dependence: Secondary | ICD-10-CM

## 2023-12-16 DIAGNOSIS — N200 Calculus of kidney: Secondary | ICD-10-CM

## 2023-12-16 DIAGNOSIS — N939 Abnormal uterine and vaginal bleeding, unspecified: Secondary | ICD-10-CM | POA: Insufficient documentation

## 2023-12-16 LAB — URINALYSIS, ROUTINE W REFLEX MICROSCOPIC
Bilirubin, UA: NEGATIVE
Glucose, UA: NEGATIVE
Ketones, UA: NEGATIVE
Nitrite, UA: NEGATIVE
Specific Gravity, UA: >1.03 — ABNORMAL HIGH (ref 1.005–1.030)
Urobilinogen, Ur: 0.2 mg/dL (ref 0.2–1.0)
pH, UA: 6 (ref 5.0–7.5)

## 2023-12-16 LAB — MICROSCOPIC EXAMINATION

## 2023-12-16 NOTE — Progress Notes (Signed)
 Name: Christine Newman DOB: 04-Jul-1941 MRN: [id number]  History of Present Illness: [name] is a 83 y.o. female who presents today for follow up visit at [facility] [place]. - GU History: 1. Kidney stones. - 05/10/2023: Underwent left ESWL procedure by Dr. Ronne Binning.   At last visit on 11/17/2023: - Reported concern for possible stone due to intermittently seeing "pink" blood on toilet paper after voiding since December 2024. Denies gross hematuria. Saw GYN provider on 11/01/2023; exam negative for evidence of vaginal bleeding. She was otherwise asymptomatic for stone (no flank / abdominal pain; no acute urinary symptoms). - Urine microscopy: 6-10 WBC/hpf, 11-30 RBC/hpf, few bacteria.  - Had KUB on 11/16/2023 which had not yet been evaluated by radiologist at the time of this visit; discussed possible left UPJ stone per urology NP image review. Plan was Flomax for MET x2 weeks.  - Radiology report for the KUB later stated: "Redemonstration of a 3 x 6 mm calcification overlying the left renal shadow, similar to the prior study from 05/10/2023. There is an additional 5 x 7 mm calcification overlying the left transverse process of L2 vertebra, which is also similar to the prior study from 05/10/2023. There are several sub 4 mm calcifications overlying the pelvic region, also unchanged."  Since last visit: > KUB on 11/30/2023: Awaiting radiology read; stable compared to prior appreciated per provider interpretation.  Today: She denies recent stone passage. She denies flank pain or abdominal pain. She denies fevers, nausea, or vomiting.  She denies increased urinary urgency, frequency, nocturia, dysuria, gross hematuria, hesitancy, straining to void, or sensations of incomplete emptying.  She denies vaginal pain, bleeding, abnormal discharge, itching, dryness. Last saw the blood when wiping several weeks ago - state that it has at times been bright red. Denies history of gross  hematuria.    Medications: Current Outpatient Medications  Medication Sig Dispense Refill   amLODipine (NORVASC) 10 MG tablet TAKE (1) TABLET BY MOUTH AT BEDTIME. 90 tablet 0   cholecalciferol (VITAMIN D) 1000 units tablet Take 1,000 Units by mouth daily.     folic acid (FOLVITE) 400 MCG tablet Take 400 mcg by mouth daily.     HYDROcodone-acetaminophen (NORCO) 5-325 MG tablet Take 1 tablet by mouth every 6 (six) hours as needed for moderate pain. 30 tablet 0   Multiple Vitamins-Minerals (MULTIVITAMIN WITH MINERALS) tablet Take 1 tablet by mouth daily.     Potassium Citrate (UROCIT-K 15) 15 MEQ (1620 MG) TBCR Take 1 tablet by mouth 2 (two) times daily. 180 tablet 3   spironolactone (ALDACTONE) 25 MG tablet Take by mouth.     vitamin B-12 (CYANOCOBALAMIN) 1000 MCG tablet Take 1,000 mcg by mouth daily.     cloNIDine (CATAPRES) 0.1 MG tablet Take 1 tablet (0.1 mg total) by mouth 2 (two) times daily. (Patient not taking: Reported on 12/16/2023) 180 tablet 1   ondansetron (ZOFRAN) 4 MG tablet Take 1 tablet (4 mg total) by mouth daily as needed for nausea or vomiting. (Patient not taking: Reported on 12/16/2023) 30 tablet 1   tamsulosin (FLOMAX) 0.4 MG CAPS capsule Take 1 capsule (0.4 mg total) by mouth daily after supper. (Patient not taking: Reported on 12/16/2023) 30 capsule 0   No current facility-administered medications for this visit.    Allergies: Allergies  Allergen Reactions   Metoprolol     Bradycardia    Past Medical History:  Diagnosis Date   Anal polyp 2005   condyloma acuminatum   Anemia  CKD (chronic kidney disease)    Cr 1.9 -09/2014   Colonic mass    Diverticulitis    with abscess   History of kidney stones    Hypertension    Past Surgical History:  Procedure Laterality Date   anal polypectomy  01/02/2004   Dr.Rosenbower- anal polyp removed. bx= condyloma acuminatum   CATARACT EXTRACTION W/PHACO Left 08/10/2018   Procedure: CATARACT EXTRACTION PHACO AND  INTRAOCULAR LENS PLACEMENT LEFT EYE;  Surgeon: Gemma Payor, MD;  Location: [facility];  Service: Ophthalmology;  Laterality: Left;  left   CATARACT EXTRACTION W/PHACO Right 09/04/2018   Procedure: CATARACT EXTRACTION PHACO AND INTRAOCULAR LENS PLACEMENT (IOC);  Surgeon: Gemma Payor, MD;  Location: [facility];  Service: Ophthalmology;  Laterality: Right;  CDE: 11.16   COLONOSCOPY  04/08/2003   Dr.Mann- small nonbleeding internal and external hemorrhoids small polypoid mass at anal verge, normal appearing L colon, transverse colon, R colon including cecum. stenotic ileocecal valve. bx of maa= benign rectal type mucosa with features of mucosal prolapse syndrome   COLONOSCOPY N/A 04/02/2015   Dr.Rourk- colonic diverticulosis, no evidence of colonic neoplasm. abnormal ileocecal valve with ulceration bx= ulcerated/eroded benign ileocecal valve mucosa   COLONOSCOPY N/A 06/14/2018   scattered diverticula in sigmoid and descending colon. Distal 10 cm of neoterminal ileum appears normal. Appears to have surgical remission   EXTRACORPOREAL SHOCK WAVE LITHOTRIPSY Left 05/10/2023   Procedure: EXTRACORPOREAL SHOCK WAVE LITHOTRIPSY (ESWL);  Surgeon: Malen Gauze, MD;  Location: [facility];  Service: Urology;  Laterality: Left;   FLEXIBLE SIGMOIDOSCOPY  11/06/2003   Dr.Mann- small nodular mass at anal verge, bx done. early L side diverticula. bx= benign rectal mucosa with features of mucosal prolapse syndrome. no adenomatous changes or evidence of malignancy identified.    IR NEPHROSTOMY PLACEMENT LEFT  01/05/2018   KIDNEY STONE SURGERY     LAPAROSCOPY N/A 11/02/2017   Procedure: LAPAROSCOPIC ILEOCECECTOMY;  Surgeon: Romie Levee, MD;  Location: [facility];  Service: General;  Laterality: N/A;   NEPHROLITHOTOMY Left 02/13/2018   Procedure: NEPHROLITHOTOMY PERCUTANEOUS;  Surgeon: Malen Gauze, MD;  Location: [facility];  Service: Urology;  Laterality: Left;   Family History  Problem Relation Age of Onset   Hypertension  Mother    Heart disease Father    Cancer Sister        breast   Colon cancer Neg Hx    Colon polyps Neg Hx    Social History   Socioeconomic History   Marital status: Widowed    Spouse name: Not on file   Number of children: Not on file   Years of education: Not on file   Highest education level: Not on file  Occupational History   Not on file  Tobacco Use   Smoking status: Former    Current packs/day: 0.00    Average packs/day: 0.3 packs/day for 5.0 years (1.3 ttl pk-yrs)    Types: Cigarettes    Start date: 01/18/1957    Quit date: 01/18/1962    Years since quitting: 61.9   Smokeless tobacco: Never   Tobacco comments:    1 pack cigarettes per week ; quit in her early 86s   Vaping Use   Vaping status: Never Used  Substance and Sexual Activity   Alcohol use: No   Drug use: Never   Sexual activity: Not Currently    Birth control/protection: Post-menopausal  Other Topics Concern   Not on file  Social History Narrative   Not on file  Social Drivers of Corporate investment banker Strain: Low Risk  (11/01/2023)   Received from [facility]   Overall Financial Resource Strain (CARDIA)    Difficulty of Paying Living Expenses: Not hard at all  Food Insecurity: No Food Insecurity (11/01/2023)   Received from [facility]   Hunger Vital Sign    Worried About Running Out of Food in the Last Year: Never true    Ran Out of Food in the Last Year: Never true  Transportation Needs: No Transportation Needs (11/01/2023)   Received from [facility] - Transportation    Lack of Transportation (Medical): No    Lack of Transportation (Non-Medical): No  Physical Activity: Insufficiently Active (08/10/2023)   Received from [facility]   Exercise Vital Sign    Days of Exercise per Week: 3 days    Minutes of Exercise per Session: 30 min  Stress: Stress Concern Present (08/10/2023)   Received from [facility] - Occupational  Stress Questionnaire    Feeling of Stress : To some extent  Social Connections: Moderately Integrated (08/10/2023)   Received from [facility]   Social Network    How would you rate your social network (family, work, friends)?: Adequate participation with social networks  Intimate Partner Violence: Not At Risk (08/10/2023)   Received from [facility]   HITS    Over the last 12 months how often did your partner physically hurt you?: Never    Over the last 12 months how often did your partner insult you or talk down to you?: Never    Over the last 12 months how often did your partner threaten you with physical harm?: Never    Over the last 12 months how often did your partner scream or curse at you?: Never    SUBJECTIVE  Review of Systems Constitutional: Patient denies any unintentional weight loss or change in strength lntegumentary: Patient denies any rashes or pruritus Cardiovascular: Patient denies chest pain or syncope Respiratory: Patient denies shortness of breath Gastrointestinal: As per HPI Musculoskeletal: Patient denies muscle cramps or weakness Neurologic: Patient denies convulsions or seizures Allergic/Immunologic: Patient denies recent allergic reaction(s) Hematologic/Lymphatic: Patient denies bleeding tendencies Endocrine: Patient denies heat/cold intolerance  GU: As per HPI.  OBJECTIVE Vitals:   12/16/23 1327  BP: (!) 160/87  Pulse: 84  Temp: 97.6 F (36.4 C)   There is no height or weight on file to calculate BMI.  Physical Examination Constitutional: No obvious distress; patient is non-toxic appearing  Cardiovascular: No visible lower extremity edema.  Respiratory: The patient does not have audible wheezing/stridor; respirations do not appear labored  Gastrointestinal: Abdomen non-distended Musculoskeletal: Normal ROM of UEs  Skin: No obvious rashes/open sores  Neurologic: CN 2-12 grossly intact Psychiatric: Answered questions appropriately with  normal affect  Hematologic/Lymphatic/Immunologic: No obvious bruises or sites of spontaneous bleeding  Urine microscopy: 11-30 WBC/hpf, 11-30 RBC/hpf, few bacteria  ASSESSMENT Kidney stones - Plan: Urinalysis, Routine w reflex microscopic, CT HEMATURIA WORKUP  Hematuria, unspecified type - Plan: CT HEMATURIA WORKUP  Abnormal urinalysis - Plan: CT HEMATURIA WORKUP  Former smoker - Plan: CT HEMATURIA WORKUP  Vaginal spotting  The etiology for her intermittent vaginal spotting and abnormal urinalysis with asymptomatic microscopic hematuria remain unclear. KUB imaging has been indeterminate. Possible etiologies may include but are not limited to: vigorous exercise, sexual activity, stone, trauma, blood thinner use, urinary tract infection, kidney function, vaginal atrophy, malignancy. We discussed risk for  malignancy related to past nicotine use and age >45.  We discussed pt's nicotine use as a risk factor for GU cancer and encouraged continued cessation. Advised CT hematuria and cystoscopy for further evaluation.   Pt verbalized understanding and decided to pursue this work-up. Patient agreed to follow-up afterward to discuss the results and formulate a treatment plan based on the findings. All questions were answered.   PLAN Advised the following: CT hematuria. Return for 1st available cystoscopy with any urology MD.  Orders Placed This Encounter  Procedures   CT HEMATURIA WORKUP    Standing Status:   Future    Expiration Date:   12/15/2024    Reason for Exam (SYMPTOM  OR DIAGNOSIS REQUIRED):   Microscopic hematuria    Preferred imaging location?:   [facility]   Urinalysis, Routine w reflex microscopic   Total time spent caring for the patient today was over 30 minutes. This includes time spent on the date of the visit reviewing the patient's chart before the visit, time spent during the visit, and time spent after the visit on documentation. Over 50% of that time was spent  in face-to-face time with this patient for direct counseling. E&M based on time and complexity of medical decision making.  It has been explained that the patient is to follow regularly with their PCP in addition to all other providers involved in their care and to follow instructions provided by these respective offices. Patient advised to contact urology clinic if any urologic-pertaining questions, concerns, new symptoms or problems arise in the interim period.  There are no Patient Instructions on file for this visit.  Electronically signed by:  Donnita Falls, MSN, FNP-C, CUNP 12/16/2023 2:03 PM

## 2024-01-05 ENCOUNTER — Ambulatory Visit (HOSPITAL_COMMUNITY)
Admission: RE | Admit: 2024-01-05 | Discharge: 2024-01-05 | Disposition: A | Discharge status: Home / Self Care | Source: Ambulatory Visit | Attending: Urology | Admitting: Urology

## 2024-01-05 ENCOUNTER — Other Ambulatory Visit: Payer: Self-pay | Admitting: Urology

## 2024-01-05 DIAGNOSIS — N200 Calculus of kidney: Secondary | ICD-10-CM | POA: Diagnosis present

## 2024-01-05 DIAGNOSIS — R8290 Unspecified abnormal findings in urine: Secondary | ICD-10-CM | POA: Diagnosis present

## 2024-01-05 DIAGNOSIS — Z87891 Personal history of nicotine dependence: Secondary | ICD-10-CM

## 2024-01-05 DIAGNOSIS — R319 Hematuria, unspecified: Secondary | ICD-10-CM | POA: Insufficient documentation

## 2024-01-05 DIAGNOSIS — N939 Abnormal uterine and vaginal bleeding, unspecified: Secondary | ICD-10-CM

## 2024-01-05 LAB — POCT I-STAT CREATININE: Creatinine, Ser: 1.8 mg/dL — ABNORMAL HIGH (ref 0.44–1.00)

## 2024-01-05 MED ORDER — IOHEXOL 300 MG/ML  SOLN: 125.0000 mL | Freq: Once | INTRAMUSCULAR | Status: DC | PRN

## 2024-01-11 ENCOUNTER — Telehealth: Payer: Self-pay

## 2024-01-11 NOTE — Telephone Encounter (Signed)
 Pt called to get Ct results Pt was made aware results are not in yt however when they are she will be notified

## 2024-01-17 ENCOUNTER — Telehealth: Payer: Self-pay

## 2024-01-17 NOTE — Telephone Encounter (Signed)
 Patient is made aware and voiced understanding. Letter is upfront for patient to pick up.

## 2024-01-17 NOTE — Telephone Encounter (Signed)
-----   Message from Donnita Falls sent at 01/16/2024  1:16 PM EDT ----- Please let pt know CT showed: - Two nonobstructing left intrarenal stones.  - Severe left renal cortical scarring/atrophy. This is related to her CKD; advised to follow up with her PCP and nephrology.  - A 4.4 cm simple right ovarian cystic lesion, new from 2019. Advised to follow up with her GYN provider with pelvic ultrasound for surveillance in 6-12 months.

## 2024-01-18 ENCOUNTER — Telehealth: Payer: Self-pay | Admitting: Urology

## 2024-01-18 NOTE — Telephone Encounter (Signed)
 Please schedule the patient with Maralyn Sago or MD.  Clinical staff is unable to read CT results.

## 2024-01-18 NOTE — Telephone Encounter (Signed)
 Wants someone to explain CT results

## 2024-01-19 NOTE — Telephone Encounter (Signed)
 Ok she will have to keep appt for results.  If she has specific questions they can be forwarded to appropriate nurse and they can relay to provider.

## 2024-01-28 ENCOUNTER — Ambulatory Visit: Admission: EM | Admit: 2024-01-28 | Discharge: 2024-01-28 | Disposition: A | Discharge status: Home / Self Care

## 2024-01-28 DIAGNOSIS — S8011XA Contusion of right lower leg, initial encounter: Secondary | ICD-10-CM | POA: Diagnosis not present

## 2024-01-28 NOTE — Discharge Instructions (Signed)
 Your exam is reassuring today.  Wear compression stockings, elevate at rest to help with swelling, over-the-counter pain relievers as needed.  You may ice the area additionally if this is helpful.  Follow-up for significantly worsening symptoms.

## 2024-01-28 NOTE — ED Provider Notes (Signed)
 [facility]    CSN: [id number] Arrival date & time: 01/28/24  0944      History   Chief Complaint No chief complaint on file.   HPI Christine Newman is a 83 y.o. female.   Patient presenting today with 1 week history of right lower leg pain, bruising, redness, swelling after the car door got shut on the leg, hitting her leg.  She denies loss of range of motion, numbness, tingling, bleeding or drainage, open wounds.  Has been trying heat with mild relief.    Past Medical History:  Diagnosis Date   Anal polyp 2005   condyloma acuminatum   Anemia    CKD (chronic kidney disease)    Cr 1.9 -09/2014   Colonic mass    Diverticulitis    with abscess   History of kidney stones    Hypertension     Patient Active Problem List   Diagnosis Date Noted   Former smoker 12/16/2023   Vaginal spotting 12/16/2023   Hematuria 12/16/2023   Kidney stones 11/17/2023   Sensorineural hearing loss, bilateral 08/04/2023   Osteoarthritis of midfoot 02/15/2023   History of kidney stones 07/31/2021   History of diverticulitis 07/31/2021   Osteoarthritis 07/31/2021   Pure hypercholesterolemia 07/31/2021   Osteopenia 05/11/2021   Crohn's disease in remission (HCC) 09/15/2018   Generalized anxiety disorder 09/27/2015   CKD (chronic kidney disease) stage 3, GFR 30-59 ml/min (HCC) 01/14/2015   Essential hypertension, benign 03/11/2013    Past Surgical History:  Procedure Laterality Date   anal polypectomy  01/02/2004   Dr.Rosenbower- anal polyp removed. bx= condyloma acuminatum   CATARACT EXTRACTION W/PHACO Left 08/10/2018   Procedure: CATARACT EXTRACTION PHACO AND INTRAOCULAR LENS PLACEMENT LEFT EYE;  Surgeon: Anner Kill, MD;  Location: [facility];  Service: Ophthalmology;  Laterality: Left;  left   CATARACT EXTRACTION W/PHACO Right 09/04/2018   Procedure: CATARACT EXTRACTION PHACO AND INTRAOCULAR LENS PLACEMENT (IOC);  Surgeon: Anner Kill, MD;  Location: [facility];  Service:  Ophthalmology;  Laterality: Right;  CDE: 11.16   COLONOSCOPY  04/08/2003   Dr.Mann- small nonbleeding internal and external hemorrhoids small polypoid mass at anal verge, normal appearing L colon, transverse colon, R colon including cecum. stenotic ileocecal valve. bx of maa= benign rectal type mucosa with features of mucosal prolapse syndrome   COLONOSCOPY N/A 04/02/2015   Dr.Rourk- colonic diverticulosis, no evidence of colonic neoplasm. abnormal ileocecal valve with ulceration bx= ulcerated/eroded benign ileocecal valve mucosa   COLONOSCOPY N/A 06/14/2018   scattered diverticula in sigmoid and descending colon. Distal 10 cm of neoterminal ileum appears normal. Appears to have surgical remission   EXTRACORPOREAL SHOCK WAVE LITHOTRIPSY Left 05/10/2023   Procedure: EXTRACORPOREAL SHOCK WAVE LITHOTRIPSY (ESWL);  Surgeon: Marco Severs, MD;  Location: [facility];  Service: Urology;  Laterality: Left;   FLEXIBLE SIGMOIDOSCOPY  11/06/2003   Dr.Mann- small nodular mass at anal verge, bx done. early L side diverticula. bx= benign rectal mucosa with features of mucosal prolapse syndrome. no adenomatous changes or evidence of malignancy identified.    IR NEPHROSTOMY PLACEMENT LEFT  01/05/2018   KIDNEY STONE SURGERY     LAPAROSCOPY N/A 11/02/2017   Procedure: LAPAROSCOPIC ILEOCECECTOMY;  Surgeon: Joyce Nixon, MD;  Location: [facility];  Service: General;  Laterality: N/A;   NEPHROLITHOTOMY Left 02/13/2018   Procedure: NEPHROLITHOTOMY PERCUTANEOUS;  Surgeon: Marco Severs, MD;  Location: [facility];  Service: Urology;  Laterality: Left;    OB History   No obstetric history on  file.      Home Medications    Prior to Admission medications   Medication Sig Start Date End Date Taking? Authorizing Provider  amLODipine  (NORVASC ) 10 MG tablet TAKE (1) TABLET BY MOUTH AT BEDTIME. 08/05/22   Cook, Jayce G, DO  cholecalciferol (VITAMIN D) 1000 units tablet Take 1,000 Units by mouth daily.    [provider]  cloNIDine  (CATAPRES ) 0.1 MG tablet Take 1 tablet (0.1 mg total) by mouth 2 (two) times daily. Patient not taking: Reported on 12/16/2023 02/12/22   Cook, Jayce G, DO  folic acid (FOLVITE) 400 MCG tablet Take 400 mcg by mouth daily.    [provider]  HYDROcodone -acetaminophen  (NORCO) 5-325 MG tablet Take 1 tablet by mouth every 6 (six) hours as needed for moderate pain. 05/10/23   McKenzie, Arden Beck, MD  Multiple Vitamins-Minerals (MULTIVITAMIN WITH MINERALS) tablet Take 1 tablet by mouth daily.    [provider]  ondansetron  (ZOFRAN ) 4 MG tablet Take 1 tablet (4 mg total) by mouth daily as needed for nausea or vomiting. Patient not taking: Reported on 12/16/2023 05/10/23 05/09/24  Marco Severs, MD  Potassium Citrate  (UROCIT-K  15) 15 MEQ (1620 MG) TBCR Take 1 tablet by mouth 2 (two) times daily. 08/27/22   McKenzie, Arden Beck, MD  spironolactone (ALDACTONE) 25 MG tablet Take by mouth.    [provider]  tamsulosin  (FLOMAX ) 0.4 MG CAPS capsule Take 1 capsule (0.4 mg total) by mouth daily after supper. Patient not taking: Reported on 12/16/2023 11/17/23   Lauretta Ponto, FNP  vitamin B-12 (CYANOCOBALAMIN) 1000 MCG tablet Take 1,000 mcg by mouth daily.    [provider]    Family History Family History  Problem Relation Age of Onset   Hypertension Mother    Heart disease Father    Cancer Sister        breast   Colon cancer Neg Hx    Colon polyps Neg Hx     Social History Social History   Tobacco Use   Smoking status: Former    Current packs/day: 0.00    Average packs/day: 0.3 packs/day for 5.0 years (1.3 ttl pk-yrs)    Types: Cigarettes    Start date: 01/18/1957    Quit date: 01/18/1962    Years since quitting: 62.0   Smokeless tobacco: Never   Tobacco comments:    1 pack cigarettes per week ; quit in her early 65s   Vaping Use   Vaping status: Never Used  Substance Use Topics   Alcohol use: No   Drug use: Never      Allergies   Metoprolol    Review of Systems Review of Systems Per HPI  Physical Exam Triage Vital Signs ED Triage Vitals  Encounter Vitals Group     BP 01/28/24 1028 (!) 151/75     Systolic BP Percentile --      Diastolic BP Percentile --      Pulse Rate 01/28/24 1028 65     Resp 01/28/24 1028 18     Temp 01/28/24 1028 98.4 F (36.9 C)     Temp Source 01/28/24 1028 Oral     SpO2 01/28/24 1028 97 %     Weight --      Height --      Head Circumference --      Peak Flow --      Pain Score 01/28/24 1027 4     Pain Loc --      Pain  Education --      Exclude from [employer] Chart --    No data found.  Updated Vital Signs BP (!) 151/75 (BP Location: Right Arm)   Pulse 65   Temp 98.4 F (36.9 C) (Oral)   Resp 18   LMP  (LMP Unknown)   SpO2 97%   Visual Acuity Right Eye Distance:   Left Eye Distance:   Bilateral Distance:    Right Eye Near:   Left Eye Near:    Bilateral Near:     Physical Exam Vitals and nursing note reviewed.  Constitutional:      Appearance: Normal appearance. She is not ill-appearing.  HENT:     Head: Atraumatic.  Eyes:     Extraocular Movements: Extraocular movements intact.     Conjunctiva/sclera: Conjunctivae normal.  Cardiovascular:     Rate and Rhythm: Normal rate.  Pulmonary:     Effort: Pulmonary effort is normal.  Musculoskeletal:        General: Swelling, tenderness and signs of injury present. Normal range of motion.     Cervical back: Normal range of motion and neck supple.     Comments: Localized edema, bruising and erythema to the lateral right lower leg distally in the area of impact.  Trace edema and bruising to the ankle and foot below this area.  No point tenderness apart from area of impact, no bony deformity palpable, range of motion intact  Skin:    General: Skin is warm and dry.     Findings: Bruising and erythema present.  Neurological:     Mental Status: She is alert and oriented to person, place, and time.      Comments: Right lower extremity neurovascularly intact  Psychiatric:        Mood and Affect: Mood normal.        Thought Content: Thought content normal.        Judgment: Judgment normal.      UC Treatments / Results  Labs (all labs ordered are listed, but only abnormal results are displayed) Labs Reviewed - No data to display  EKG   Radiology No results found.  Procedures Procedures (including critical care time)  Medications Ordered in UC Medications - No data to display  Initial Impression / Assessment and Plan / UC Course  I have reviewed the triage vital signs and the nursing notes.  Pertinent labs & imaging results that were available during my care of the patient were reviewed by me and considered in my medical decision making (see chart for details).     Vital signs and exam very reassuring today.  X-ray imaging deferred with shared decision making.  Very low suspicion for fracture.  Consistent with hematoma/contusion.  Treat with RICE protocol, over-the-counter pain relievers, supportive home care.  Return precautions reviewed.  Final Clinical Impressions(s) / UC Diagnoses   Final diagnoses:  Hematoma of right lower extremity, initial encounter     Discharge Instructions      Your exam is reassuring today.  Wear compression stockings, elevate at rest to help with swelling, over-the-counter pain relievers as needed.  You may ice the area additionally if this is helpful.  Follow-up for significantly worsening symptoms.    ED Prescriptions   None    PDMP not reviewed this encounter.   Corbin Dess, [place] 01/28/24 1350

## 2024-01-28 NOTE — ED Triage Notes (Signed)
 Pt reports her grandchildren accidentally closed the car door on her right ankle/leg area x 1 week   Has a red swollen area on her lower left leg/ankle area

## 2024-01-30 ENCOUNTER — Ambulatory Visit (INDEPENDENT_AMBULATORY_CARE_PROVIDER_SITE_OTHER): Admitting: Urology

## 2024-01-30 VITALS — BP 147/71 | HR 70

## 2024-01-30 DIAGNOSIS — R319 Hematuria, unspecified: Secondary | ICD-10-CM

## 2024-01-30 LAB — URINALYSIS, ROUTINE W REFLEX MICROSCOPIC
Bilirubin, UA: NEGATIVE
Glucose, UA: NEGATIVE
Ketones, UA: NEGATIVE
Nitrite, UA: NEGATIVE
Specific Gravity, UA: 1.025 (ref 1.005–1.030)
Urobilinogen, Ur: 0.2 mg/dL (ref 0.2–1.0)
pH, UA: 6 (ref 5.0–7.5)

## 2024-01-30 LAB — MICROSCOPIC EXAMINATION
RBC, Urine: >30 /HPF — AB (ref 0–2)
WBC, UA: >30 /HPF — AB (ref 0–5)

## 2024-01-30 MED ORDER — CIPROFLOXACIN HCL 500 MG PO TABS
500.0000 mg | ORAL_TABLET | Freq: Once | ORAL | Status: AC
  Administered 2024-01-30: 500 mg via ORAL

## 2024-01-30 NOTE — Progress Notes (Signed)
  Christine Newman  01/30/24  CC: No chief complaint on file.   HPI:  Pt of Dr. Wyonia Newman who saw Christine Newman. She had pink urine. UA 11-30 rbcs. 01/14/2024 CT Two nonobstructing left renal calculi measuring up to 6 mm. A 4.4 cm simple right ovarian cystic lesion, new from 2019. Follow-up pelvic ultrasound is suggested in 6-12 months. She knows about the ovarian cyst and has an appt with her PCP to address it - we discussed the importance of f/u. She is well today without dysuria or gross hematuria.   Blood pressure (!) 147/71, pulse 70. NED. A&Ox3.   No respiratory distress   Abd soft, NT, ND Normal external genitalia without lesion, moderate atrophy, patent urethral meatus Meatus appears normal.  Grade 2 rectocele   Chaperone - exam and cysto - Alisheya   Cystoscopy Procedure Note  Patient identification was confirmed, informed consent was obtained, and patient was prepped using Betadine  solution.  Lidocaine  jelly was administered per urethral meatus.    Procedure: - Flexible cystoscope introduced, without any difficulty.   - Thorough search of the bladder revealed:    normal urethral meatus    normal urothelium    no stones    no ulcers     no tumors    no urethral polyps    no trabeculation  - Ureteral orifices were normal in position and appearance.  Post-Procedure: - Patient tolerated the procedure well  Assessment/ Plan:  Microhematuria - benign eval. F/u with Christine Newman or Dr. Wyonia Newman for continued GU care.   No follow-ups on file.  Christine Coyer, MD

## 2024-04-17 ENCOUNTER — Telehealth: Payer: Self-pay

## 2024-04-17 NOTE — Telephone Encounter (Signed)
Left message for patient to call back and schedule new patient appointment.

## 2024-04-17 NOTE — Telephone Encounter (Signed)
 Spoke with the patient regarding the referral to GYN oncology. Patient scheduled as new patient with Dr. Viktoria on 04/20/2024. Patient given an arrival time of 9:15am.  Explained to the patient the the doctor will perform a pelvic exam at this visit. Patient given the policy that only one visitor allowed and that visitor must be over 16 yrs are allowed in the Cancer Center. Patient given the address/phone number for the clinic and that the center offers free valet service. Patient aware that masks required.

## 2024-04-19 ENCOUNTER — Encounter: Payer: Self-pay | Admitting: Gynecologic Oncology

## 2024-04-19 NOTE — Progress Notes (Signed)
 GYNECOLOGIC ONCOLOGY NEW PATIENT CONSULTATION   Patient Name: Christine Newman  Patient Age: 83 y.o. Date of Service: 04/20/24 Referring Provider: Slater Door, MD  Primary Care Provider: Suanne Pfeiffer, NP Consulting Provider: Comer Dollar, MD   Assessment/Plan:  Postmenopausal patient with simple adnexal mass.  We discussed recent imaging findings together.  Reviewed CT images.  Based on CT and ultrasound, this adnexal mass appears simple in nature.  Luckily, the patient is completely asymptomatic (we reviewed that this is normal given the small size of the cyst).  With regard to tumor markers, we discussed the limitations of these tests.  I reviewed that CEA can be elevated in colorectal cancer and less commonly in ovarian borderline tumors or malignancies.  Unfortunately, this test is neither sensitive nor specific and can be elevated in other disease processes such as inflammatory bowel disease.  Given her history of Crohn's, it is difficult to determine whether the elevation in her CEA is related to IBD versus this adnexal mass.  My suspicion is that the former, but in the setting of well-controlled Crohn's or IBD in remission, I would not necessarily expect her CEA to be elevated.    I suggested that we repeat this tumor marker today.  There may be some fluctuation just in the setting of her Crohn's disease.  If the CEA has decreased or is normal, this would be very reassuring.  We discussed multiple options including no further follow-up, conservative management with repeat ultrasound imaging in 3 months and repeat CEA, or moving forward with definitive surgery.  Given simple appearance of the mass, I think it would be very reasonable, given overall low concern that this presents a borderline process or malignancy, to proceed with close follow-up.  If the patient were to develop symptoms, if there are any changes in terms of the size or appearance of the mass on follow-up imaging, or  if her CEA continues to increase, we would likely revisit possibility of surgery.  If the patient ultimately decides to proceed with surgery, we discussed some increased risk related to surgery in the setting of her age.  She is overall very healthy and is a candidate for surgery.  My suggestion would be to proceed with robotic BSO and send the mass for frozen section.  If benign, no additional surgery (including total hysterectomy) would be necessary.  Ultimately, the patient and her son would like some time to think about things.  I wrote a summary of our discussion today.  We decided to proceed with repeat CEA today.  They will call me sometime next week to let me know what other her decision is to schedule surgery in the near future versus plan on close follow-up.  A copy of this note was sent to the patient's referring provider.   65 minutes of total time was spent for this patient encounter, including preparation, face-to-face counseling with the patient and coordination of care, and documentation of the encounter.  Comer Dollar, MD  Division of Gynecologic Oncology  Department of Obstetrics and Gynecology  [facility]  ___________________________________________  Chief Complaint: Chief Complaint  Patient presents with   Ovarian Cyst    History of Present Illness:  Christine Newman is a 83 y.o. y.o. female who is seen in consultation at the request of Dr. Door for an evaluation of simple adnexal cyst, elevated CEA.  In the setting of possible hematuria, the patient underwent CT imaging in March which showed 2 nonobstructing left  renal calculi measuring up to 6 mm without hydronephrosis.  Severe left renal cortical scarring/atrophy noted.  There was incidental finding of a 4.4 cm simple right ovarian cyst.  The patient was seen initially in early May in the setting of an ovarian cyst that have been seen on recent CT scan (incidental finding).  The cyst  was noted to be new since 2019. Patient was seen on 04/06/2024.  On pelvic ultrasound in clinic, uterus measured 4.4 x 4.1 x 2.3 cm with an endometrium of 2.1 mm.  Within the right adnexa, there was a 5.3 x 3.7 cm simple appearing cyst without blood flow seen.  Left ovary not visualized.  No free fluid.  Tumor markers were obtained on 6/27 and notable for: CEA 8.8 CA 125 10 CA 19-9 13 AFP 3.1  Patient's history is notable for Crohn's disease as well as a history of diverticulitis.  She underwent ileocecectomy in 2019 after she presented with abdominal pain and CT showed a loop of inflamed small bowel.  Partial obstructive symptoms, resection was recommended.  On 11/02/2017, the patient underwent laparoscopic ileocecectomy.  Final pathology from this revealed chronic acute enteritis consistent with inflammatory bowel disease, benign appendix, no dysplasia or malignancy.  The patient presents with her son today.  She reports doing very well.  She denies any pelvic or abdominal pain.  She endorses regular bowel function.  She sometimes has multiple bowel movements a day, intermittently has loose stools.  Symptoms seem to be related to food intake.  Denies having any Crohn's flares.  She endorses normal appetite, quantifying that she is not a big eater.  Denies any nausea or emesis.  Denies any early satiety.  Denies any urinary symptoms.  PAST MEDICAL HISTORY:  Past Medical History:  Diagnosis Date   Anal polyp 2005   condyloma acuminatum   Anemia    CKD (chronic kidney disease)    Cr 1.9 -09/2014   Colonic mass    Crohn disease of colon (HCC)    Diverticulitis    with abscess   History of kidney stones    Hypertension    Kidney stone      PAST SURGICAL HISTORY:  Past Surgical History:  Procedure Laterality Date   anal polypectomy  01/02/2004   Dr.Rosenbower- anal polyp removed. bx= condyloma acuminatum   CATARACT EXTRACTION W/PHACO Left 08/10/2018   Procedure: CATARACT EXTRACTION  PHACO AND INTRAOCULAR LENS PLACEMENT LEFT EYE;  Surgeon: Perley Hamilton, MD;  Location: [facility];  Service: Ophthalmology;  Laterality: Left;  left   CATARACT EXTRACTION W/PHACO Right 09/04/2018   Procedure: CATARACT EXTRACTION PHACO AND INTRAOCULAR LENS PLACEMENT (IOC);  Surgeon: Perley Hamilton, MD;  Location: [facility];  Service: Ophthalmology;  Laterality: Right;  CDE: 11.16   COLONOSCOPY  04/08/2003   Dr.Mann- small nonbleeding internal and external hemorrhoids small polypoid mass at anal verge, normal appearing L colon, transverse colon, R colon including cecum. stenotic ileocecal valve. bx of maa= benign rectal type mucosa with features of mucosal prolapse syndrome   COLONOSCOPY N/A 04/02/2015   Dr.Rourk- colonic diverticulosis, no evidence of colonic neoplasm. abnormal ileocecal valve with ulceration bx= ulcerated/eroded benign ileocecal valve mucosa   COLONOSCOPY N/A 06/14/2018   scattered diverticula in sigmoid and descending colon. Distal 10 cm of neoterminal ileum appears normal. Appears to have surgical remission   EXTRACORPOREAL SHOCK WAVE LITHOTRIPSY Left 05/10/2023   Procedure: EXTRACORPOREAL SHOCK WAVE LITHOTRIPSY (ESWL);  Surgeon: Sherrilee Belvie CROME, MD;  Location: [facility];  Service: Urology;  Laterality: Left;   FLEXIBLE SIGMOIDOSCOPY  11/06/2003   Dr.Mann- small nodular mass at anal verge, bx done. early L side diverticula. bx= benign rectal mucosa with features of mucosal prolapse syndrome. no adenomatous changes or evidence of malignancy identified.    IR NEPHROSTOMY PLACEMENT LEFT  01/05/2018   KIDNEY STONE SURGERY     LAPAROSCOPY N/A 11/02/2017   Procedure: LAPAROSCOPIC ILEOCECECTOMY;  Surgeon: Debby Hila, MD;  Location: [facility];  Service: General;  Laterality: N/A;   NEPHROLITHOTOMY Left 02/13/2018   Procedure: NEPHROLITHOTOMY PERCUTANEOUS;  Surgeon: Sherrilee Belvie CROME, MD;  Location: [facility];  Service: Urology;  Laterality: Left;    OB/GYN HISTORY:  OB History  Gravida Para Term  Preterm AB Living  5 3      SAB IAB Ectopic Multiple Live Births          # Outcome Date GA Lbr Len/2nd Weight Sex Type Anes PTL Lv  5 Gravida           4 Gravida           3 Para           2 Para           1 Para             No LMP recorded (lmp unknown). Patient is postmenopausal.  Age at menarche: 19  Age at menopause: 38 Hx of HRT: denies Hx of STDs: denies Last pap: Unsure History of abnormal pap smears: denies  SCREENING STUDIES:  Last mammogram: 2022 (2024 per patient report)  Last colonoscopy: 2019 Last bone mineral density: 2022  MEDICATIONS: Outpatient Encounter Medications as of 04/20/2024  Medication Sig   ALPRAZolam (XANAX) 0.25 MG tablet Take 0.25 mg by mouth.   amLODipine  (NORVASC ) 10 MG tablet TAKE (1) TABLET BY MOUTH AT BEDTIME.   cholecalciferol (VITAMIN D) 1000 units tablet Take 1,000 Units by mouth daily.   folic acid (FOLVITE) 400 MCG tablet Take 400 mcg by mouth daily.   Multiple Vitamins-Minerals (MULTIVITAMIN WITH MINERALS) tablet Take 1 tablet by mouth daily.   spironolactone (ALDACTONE) 25 MG tablet Take by mouth.   vitamin B-12 (CYANOCOBALAMIN) 1000 MCG tablet Take 1,000 mcg by mouth daily.   cloNIDine  (CATAPRES ) 0.1 MG tablet Take 1 tablet (0.1 mg total) by mouth 2 (two) times daily. (Patient not taking: Reported on 04/19/2024)   HYDROcodone -acetaminophen  (NORCO) 5-325 MG tablet Take 1 tablet by mouth every 6 (six) hours as needed for moderate pain. (Patient not taking: Reported on 04/19/2024)   ondansetron  (ZOFRAN ) 4 MG tablet Take 1 tablet (4 mg total) by mouth daily as needed for nausea or vomiting. (Patient not taking: Reported on 11/17/2023)   Potassium Citrate  (UROCIT-K  15) 15 MEQ (1620 MG) TBCR Take 1 tablet by mouth 2 (two) times daily. (Patient not taking: Reported on 04/19/2024)   tamsulosin  (FLOMAX ) 0.4 MG CAPS capsule Take 1 capsule (0.4 mg total) by mouth daily after supper. (Patient not taking: Reported on 04/19/2024)   No  facility-administered encounter medications on file as of 04/20/2024.    ALLERGIES:  Allergies  Allergen Reactions   Metoprolol      Bradycardia     FAMILY HISTORY:  Family History  Problem Relation Age of Onset   Hypertension Mother    Heart disease Father    Breast cancer Sister    Colon cancer Neg Hx    Colon polyps Neg Hx    Pancreatic cancer Neg Hx    Prostate cancer Neg Hx  Endometrial cancer Neg Hx    Ovarian cancer Neg Hx      SOCIAL HISTORY:  Social Connections: Moderately Integrated (08/10/2023)   Received from [facility]   Social Network    How would you rate your social network (family, work, friends)?: Adequate participation with social networks    REVIEW OF SYSTEMS:  Denies appetite changes, fevers, chills, fatigue, unexplained weight changes. Denies hearing loss, neck lumps or masses, mouth sores, ringing in ears or voice changes. Denies cough or wheezing.  Denies shortness of breath. Denies chest pain or palpitations. Denies leg swelling. Denies abdominal distention, pain, blood in stools, constipation, diarrhea, nausea, vomiting, or early satiety. Denies pain with intercourse, dysuria, frequency, hematuria or incontinence. Denies hot flashes, pelvic pain, vaginal bleeding or vaginal discharge.   Denies joint pain, back pain or muscle pain/cramps. Denies itching, rash, or wounds. Denies dizziness, headaches, numbness or seizures. Denies swollen lymph nodes or glands, denies easy bruising or bleeding. Denies anxiety, depression, confusion, or decreased concentration.  Physical Exam:  Vital Signs for this encounter:  Blood pressure (!) 159/63, pulse 60, temperature 98.2 F (36.8 C), temperature source Oral, resp. rate 19, height 5' 5 (1.651 m), weight 138 lb (62.6 kg), SpO2 100%. Body mass index is 22.96 kg/m. General: Alert, oriented, no acute distress.  HEENT: Normocephalic, atraumatic. Sclera anicteric.  Chest: Clear to auscultation  bilaterally. No wheezes, rhonchi, or rales. Cardiovascular: Regular rate and rhythm, no murmurs, rubs, or gallops.  Abdomen: Normoactive bowel sounds. Soft, nondistended, nontender to palpation. No masses or hepatosplenomegaly appreciated. No palpable fluid wave.  Extremities: Grossly normal range of motion. Warm, well perfused. No edema bilaterally.  Skin: No rashes or lesions.  Lymphatics: No cervical, supraclavicular, or inguinal adenopathy.  GU:  Normal external female genitalia. No lesions. No discharge or bleeding.             Bladder/urethra:  No lesions or masses, well supported bladder             Vagina: Mildly atrophic, no lesions.             Cervix: Normal appearing, no lesions.             Uterus: Small, mobile, no parametrial involvement or nodularity.             Adnexa: Small, mobile mass appreciated in the right cul-de-sac, 3-4 cm, smooth.  No tenderness on exam.  Rectal: Deferred.  LABORATORY AND RADIOLOGIC DATA:  Outside medical records were reviewed to synthesize the above history, along with the history and physical obtained during the visit.   Lab Results  Component Value Date   WBC 6.1 07/31/2021   HGB 14.8 07/31/2021   HCT 45.6 07/31/2021   PLT 245 07/31/2021   GLUCOSE 94 07/31/2021   CHOL 194 07/31/2021   TRIG 174 (H) 07/31/2021   HDL 63 07/31/2021   LDLCALC 101 (H) 07/31/2021   ALT 11 07/31/2021   AST 19 07/31/2021   NA 144 07/31/2021   K 4.6 07/31/2021   CL 106 07/31/2021   CREATININE 1.80 (H) 01/05/2024   BUN 17 07/31/2021   CO2 22 07/31/2021   INR 1.06 01/05/2018   HGBA1C 5.4 07/31/2021

## 2024-04-20 ENCOUNTER — Inpatient Hospital Stay: Attending: Gynecologic Oncology | Admitting: Gynecologic Oncology

## 2024-04-20 ENCOUNTER — Encounter: Payer: Self-pay | Admitting: Gynecologic Oncology

## 2024-04-20 ENCOUNTER — Ambulatory Visit: Payer: Self-pay | Admitting: Gynecologic Oncology

## 2024-04-20 ENCOUNTER — Inpatient Hospital Stay

## 2024-04-20 VITALS — BP 152/74 | HR 60 | Temp 98.2°F | Resp 19 | Ht 65.0 in | Wt 138.0 lb

## 2024-04-20 DIAGNOSIS — Z803 Family history of malignant neoplasm of breast: Secondary | ICD-10-CM | POA: Diagnosis not present

## 2024-04-20 DIAGNOSIS — I129 Hypertensive chronic kidney disease with stage 1 through stage 4 chronic kidney disease, or unspecified chronic kidney disease: Secondary | ICD-10-CM | POA: Insufficient documentation

## 2024-04-20 DIAGNOSIS — R970 Elevated carcinoembryonic antigen [CEA]: Secondary | ICD-10-CM | POA: Insufficient documentation

## 2024-04-20 DIAGNOSIS — N83201 Unspecified ovarian cyst, right side: Secondary | ICD-10-CM | POA: Diagnosis not present

## 2024-04-20 DIAGNOSIS — N189 Chronic kidney disease, unspecified: Secondary | ICD-10-CM | POA: Diagnosis not present

## 2024-04-20 DIAGNOSIS — Z87891 Personal history of nicotine dependence: Secondary | ICD-10-CM | POA: Insufficient documentation

## 2024-04-20 DIAGNOSIS — N183 Chronic kidney disease, stage 3 unspecified: Secondary | ICD-10-CM | POA: Diagnosis not present

## 2024-04-20 DIAGNOSIS — Z79899 Other long term (current) drug therapy: Secondary | ICD-10-CM | POA: Diagnosis not present

## 2024-04-20 DIAGNOSIS — K5090 Crohn's disease, unspecified, without complications: Secondary | ICD-10-CM | POA: Insufficient documentation

## 2024-04-20 LAB — CEA (ACCESS): CEA (CHCC): 6.89 ng/mL — ABNORMAL HIGH (ref 0.00–5.00)

## 2024-04-20 NOTE — Telephone Encounter (Signed)
-----   Message from Christine Newman sent at 04/20/2024  2:16 PM EDT ----- Could you please let her know that the CEA or tumor marker that we repeated today is still high but less than it was the last time.  The value was 6.89 today.  Previously it was 8.8. ----- Message ----- From: Rebecka, Lab In [place] Sent: 04/20/2024   1:27 PM EDT To: Christine JONELLE Dollar, MD

## 2024-04-20 NOTE — Patient Instructions (Addendum)
 It was very nice to meet you.  Today, we discussed your recent CT scan and ultrasound.  On CT scan, you have a cyst that appears to be on your right ovary that measures a little under 2 inches.  Although I cannot see the pictures from the ultrasound that you had with your OB/GYN, the mass is similar in size and appears simple.  This means that it appears like a water  balloon and does not have features that increase our concern for a precancer or a cancer.  The reason you were sent to me was because one of the blood test you had, or tumor markers, was elevated.  The blood test that was elevated is called CEA.  This is generally a tumor marker that we can see high in colon or rectal cancers.  Unfortunately, there are other diseases that can cause this number to be high, and 1 of these diseases is inflammatory bowel disease.  Given your history of Crohn's, it is difficult to know if this number is high because of your Crohn's or because of some other reason.  We will plan to repeat this number today, which may help make decisions about close follow-up versus surgery.  We discussed 2 options today.  The first, would be a conservative approach.  This would be waiting and repeating ultrasound imaging and the blood test 3 months after your CT scan.  This would help us  to know if there had been any growth in the cyst, to make sure that it does not look any more concerning on imaging, and to see if there is any change to your abnormal blood test.  The other option would be to proceed with surgery now.  While there is certainly some risk to surgery, in the setting of your age and other medical problems, you are overall a good candidate for surgery.  This surgery would be done robotically, which is laparoscopic surgery done through little incisions.  I would remove your tubes and ovaries.  Unless we find something cancerous on the day of surgery, you would not need to have your uterus and cervix removed.  Please call  my office at [phone] next week to let me know how you would like to proceed.

## 2024-04-20 NOTE — Telephone Encounter (Signed)
 Spoke with [name] and relayed message from Dr. Viktoria that the CEA or tumor marker that we repeated today is still high but less than it was the last time. The value was 6.89 today. Previously it was 8.8. Pt verbalized understanding and states she will call the office next week with a decision about surgery, pt is reviewing paper work from today's visit and processing the information. [name] thanked the office for calling.

## 2024-04-25 ENCOUNTER — Telehealth: Payer: Self-pay | Admitting: Oncology

## 2024-04-25 ENCOUNTER — Encounter: Payer: Self-pay | Admitting: Obstetrics and Gynecology

## 2024-04-25 NOTE — Telephone Encounter (Signed)
 Called Christine Newman to see if she has any further questions and if she is ready to make a decision on how to move forward with treatment.  She is planning to talk with her son tonight and will call back tomorrow morning with her decision.

## 2024-04-26 NOTE — Telephone Encounter (Signed)
 Christine Newman called back and has decided to proceed with surgery.  Any date will work for her except next Monday.  Advised I will call her back with a surgery date.

## 2024-04-27 ENCOUNTER — Telehealth: Payer: Self-pay

## 2024-04-27 NOTE — Telephone Encounter (Signed)
 Per Christine Epps NP, pt is aware of surgery scheduled for 8/7. Pre-op phone visit scheduled with Dr.Tucker on 7/25 @ 6:00, pre-op with Darice Devona Amen navigator, scheduled on 8/5 @ 11:00.  Christine Newman is aware she will also receive a call from [facility] pre-admit testing.   Pt agreed to all date/times

## 2024-04-30 ENCOUNTER — Telehealth: Payer: Self-pay | Admitting: Surgery

## 2024-04-30 NOTE — Telephone Encounter (Signed)
 Patient called wanting to clarify if she needs additional lab work prior to surgery. Patient advised that she will be called by pre-op to schedule a pre-admission appointment and they will do labs at that time. Patient verbalized understanding and had no other concerns at this time.

## 2024-05-01 ENCOUNTER — Telehealth: Payer: Self-pay

## 2024-05-01 NOTE — Telephone Encounter (Signed)
 Spoke with patient and had phone visit moved from 7/25 to 7/23.Christine Newman patient confirmed and understood.

## 2024-05-02 ENCOUNTER — Encounter: Payer: Self-pay | Admitting: Gynecologic Oncology

## 2024-05-02 ENCOUNTER — Inpatient Hospital Stay (HOSPITAL_BASED_OUTPATIENT_CLINIC_OR_DEPARTMENT_OTHER): Admitting: Gynecologic Oncology

## 2024-05-02 DIAGNOSIS — N83201 Unspecified ovarian cyst, right side: Secondary | ICD-10-CM

## 2024-05-02 DIAGNOSIS — R970 Elevated carcinoembryonic antigen [CEA]: Secondary | ICD-10-CM

## 2024-05-02 NOTE — H&P (View-Only) (Signed)
 Gynecologic Oncology Telehealth Note: Gyn-Onc  I connected with Christine Newman on 05/02/24 at  6:00 PM EDT by telephone and verified that I am speaking with the correct person using two identifiers.  I discussed the limitations, risks, security and privacy concerns of performing an evaluation and management service by telemedicine and the availability of in-person appointments. I also discussed with the patient that there may be a patient responsible charge related to this service. The patient expressed understanding and agreed to proceed.  Other persons participating in the visit and their role in the encounter: none.  Patient's location: [facility] Provider's location: [facility]  Reason for Visit: follow-up  Treatment History: In the setting of possible hematuria, the patient underwent CT imaging in March which showed 2 nonobstructing left renal calculi measuring up to 6 mm without hydronephrosis.  Severe left renal cortical scarring/atrophy noted.  There was incidental finding of a 4.4 cm simple right ovarian cyst.   The patient was seen initially in early May in the setting of an ovarian cyst that have been seen on recent CT scan (incidental finding).  The cyst was noted to be new since 2019. Patient was seen on 04/06/2024.  On pelvic ultrasound in clinic, uterus measured 4.4 x 4.1 x 2.3 cm with an endometrium of 2.1 mm.  Within the right adnexa, there was a 5.3 x 3.7 cm simple appearing cyst without blood flow seen.  Left ovary not visualized.  No free fluid.   Tumor markers were obtained on 6/27 and notable for: CEA 8.8 CA 125 10 CA 19-9 13 AFP 3.1  Interval History: Doing well.  Past Medical/Surgical History: Past Medical History:  Diagnosis Date   Anal polyp 2005   condyloma acuminatum   Anemia    CKD (chronic kidney disease)    Cr 1.9 -09/2014   Colonic mass    Crohn disease of colon (HCC)    Diverticulitis    with abscess   History of kidney stones    Hypertension    Kidney  stone     Past Surgical History:  Procedure Laterality Date   anal polypectomy  01/02/2004   Dr.Rosenbower- anal polyp removed. bx= condyloma acuminatum   CATARACT EXTRACTION W/PHACO Left 08/10/2018   Procedure: CATARACT EXTRACTION PHACO AND INTRAOCULAR LENS PLACEMENT LEFT EYE;  Surgeon: Perley Hamilton, MD;  Location: [facility];  Service: Ophthalmology;  Laterality: Left;  left   CATARACT EXTRACTION W/PHACO Right 09/04/2018   Procedure: CATARACT EXTRACTION PHACO AND INTRAOCULAR LENS PLACEMENT (IOC);  Surgeon: Perley Hamilton, MD;  Location: [facility];  Service: Ophthalmology;  Laterality: Right;  CDE: 11.16   COLONOSCOPY  04/08/2003   Dr.Mann- small nonbleeding internal and external hemorrhoids small polypoid mass at anal verge, normal appearing L colon, transverse colon, R colon including cecum. stenotic ileocecal valve. bx of maa= benign rectal type mucosa with features of mucosal prolapse syndrome   COLONOSCOPY N/A 04/02/2015   Dr.Rourk- colonic diverticulosis, no evidence of colonic neoplasm. abnormal ileocecal valve with ulceration bx= ulcerated/eroded benign ileocecal valve mucosa   COLONOSCOPY N/A 06/14/2018   scattered diverticula in sigmoid and descending colon. Distal 10 cm of neoterminal ileum appears normal. Appears to have surgical remission   EXTRACORPOREAL SHOCK WAVE LITHOTRIPSY Left 05/10/2023   Procedure: EXTRACORPOREAL SHOCK WAVE LITHOTRIPSY (ESWL);  Surgeon: Sherrilee Belvie CROME, MD;  Location: [facility];  Service: Urology;  Laterality: Left;   FLEXIBLE SIGMOIDOSCOPY  11/06/2003   Dr.Mann- small nodular mass at anal verge, bx done. early L side diverticula. bx=  benign rectal mucosa with features of mucosal prolapse syndrome. no adenomatous changes or evidence of malignancy identified.    IR NEPHROSTOMY PLACEMENT LEFT  01/05/2018   KIDNEY STONE SURGERY     LAPAROSCOPY N/A 11/02/2017   Procedure: LAPAROSCOPIC ILEOCECECTOMY;  Surgeon: Debby Hila, MD;  Location: [facility];  Service: General;   Laterality: N/A;   NEPHROLITHOTOMY Left 02/13/2018   Procedure: NEPHROLITHOTOMY PERCUTANEOUS;  Surgeon: Sherrilee Belvie CROME, MD;  Location: [facility];  Service: Urology;  Laterality: Left;    Family History  Problem Relation Age of Onset   Hypertension Mother    Heart disease Father    Breast cancer Sister    Colon cancer Neg Hx    Colon polyps Neg Hx    Pancreatic cancer Neg Hx    Prostate cancer Neg Hx    Endometrial cancer Neg Hx    Ovarian cancer Neg Hx     Social History   Socioeconomic History   Marital status: Widowed    Spouse name: Not on file   Number of children: Not on file   Years of education: Not on file   Highest education level: Not on file  Occupational History   Not on file  Tobacco Use   Smoking status: Former    Current packs/day: 0.00    Average packs/day: 0.3 packs/day for 5.0 years (1.3 ttl pk-yrs)    Types: Cigarettes    Start date: 01/18/1957    Quit date: 01/18/1962    Years since quitting: 62.3   Smokeless tobacco: Never   Tobacco comments:    1 pack cigarettes per week ; quit in her early 1s   Vaping Use   Vaping status: Never Used  Substance and Sexual Activity   Alcohol use: No   Drug use: Never   Sexual activity: Not Currently    Birth control/protection: Post-menopausal  Other Topics Concern   Not on file  Social History Narrative   Not on file   Social Drivers of Health   Financial Resource Strain: Low Risk  (11/01/2023)   Received from [facility]   Overall Financial Resource Strain (CARDIA)    Difficulty of Paying Living Expenses: Not hard at all  Food Insecurity: No Food Insecurity (11/01/2023)   Received from [facility]   Hunger Vital Sign    Within the past 12 months, you worried that your food would run out before you got the money to buy more.: Never true    Within the past 12 months, the food you bought just didn't last and you didn't have money to get more.: Never true  Transportation Needs: No Transportation Needs  (11/01/2023)   Received from [facility] - Transportation    Lack of Transportation (Medical): No    Lack of Transportation (Non-Medical): No  Physical Activity: Insufficiently Active (08/10/2023)   Received from [facility]   Exercise Vital Sign    On average, how many days per week do you engage in moderate to strenuous exercise (like a brisk walk)?: 3 days    On average, how many minutes do you engage in exercise at this level?: 30 min  Stress: Stress Concern Present (08/10/2023)   Received from [facility] - Occupational Stress Questionnaire    Feeling of Stress : To some extent  Social Connections: Moderately Integrated (08/10/2023)   Received from [facility]   Social Network    How would you rate your social network (  family, work, friends)?: Adequate participation with social networks    Current Medications:  Current Outpatient Medications:    ALPRAZolam (XANAX) 0.25 MG tablet, Take 0.25 mg by mouth., Disp: , Rfl:    amLODipine  (NORVASC ) 10 MG tablet, TAKE (1) TABLET BY MOUTH AT BEDTIME., Disp: 90 tablet, Rfl: 0   cholecalciferol (VITAMIN D) 1000 units tablet, Take 1,000 Units by mouth daily., Disp: , Rfl:    folic acid (FOLVITE) 400 MCG tablet, Take 400 mcg by mouth daily., Disp: , Rfl:    Multiple Vitamins-Minerals (MULTIVITAMIN WITH MINERALS) tablet, Take 1 tablet by mouth daily., Disp: , Rfl:    ondansetron  (ZOFRAN ) 4 MG tablet, Take 1 tablet (4 mg total) by mouth daily as needed for nausea or vomiting. (Patient not taking: Reported on 11/17/2023), Disp: 30 tablet, Rfl: 1   spironolactone (ALDACTONE) 25 MG tablet, Take by mouth., Disp: , Rfl:    vitamin B-12 (CYANOCOBALAMIN) 1000 MCG tablet, Take 1,000 mcg by mouth daily., Disp: , Rfl:   Review of Symptoms: Pertinent positives as per HPI.  Physical Exam: Deferred given limitations of phone visit.  Laboratory & Radiologic Studies: Ref Range & Units (hover) 12 d  ago  CEA [facility]) 6.89 High    Assessment & Plan: Christine Newman is a 83 y.o. woman with an adnexal mass, elevated CEA.  After thinking about it risks and benefits of surgery, the patient would like to move forward with surgery.  I reviewed that her CEA had come down although still elevated.  This is overall reassuring.  We discussed that proceeding with surgery has some risk in the setting of her comorbidities and her age.  She also has had surgery for her Crohn's disease which may make surgery somewhat more complicated if she has intra-abdominal adhesions.  If we do not proceed with surgery now, discussed plan for monitoring of the cyst and repeating CEA.    We reviewed the plan for robotic BSO and sending the enlarged ovary to pathology for frozen section.  If benign, no additional procedures would be performed.  If borderline, we discussed total hysterectomy, peritoneal biopsies, and omentectomy.  In the setting of malignancy, may also perform lymphadenectomy.   We discussed the plan for a robotic bilateral salpingo-oophorectomy, possible staging, possible laparotomy. The risks of surgery were discussed in detail and she understands these to include infection; wound separation; hernia; injury to adjacent organs such as bowel, bladder, blood vessels, ureters and nerves; bleeding which may require blood transfusion; anesthesia risk; thromboembolic events; possible death; unforeseen complications; possible need for re-exploration; medical complications such as heart attack, stroke, pleural effusion and pneumonia; and, if full lymphadenectomy is performed the risk of lymphedema and lymphocyst. The patient will receive DVT and antibiotic prophylaxis as indicated. She voiced a clear understanding. She had the opportunity to ask questions.   She will have a phone visit with one of my nurses for perioperative teaching closer to surgery.  I discussed the assessment and treatment plan with the patient. The  patient was provided with an opportunity to ask questions and all were answered. The patient agreed with the plan and demonstrated an understanding of the instructions.   The patient was advised to call back or see an in-person evaluation if the symptoms worsen or if the condition fails to improve as anticipated.   14 minutes of total time was spent for this patient encounter, including preparation, phone counseling with the patient and coordination of care, and documentation of the encounter.  Comer Dollar, MD  Division of Gynecologic Oncology  Department of Obstetrics and Gynecology  [facility]

## 2024-05-02 NOTE — Progress Notes (Signed)
 Gynecologic Oncology Telehealth Note: Gyn-Onc  I connected with Christine Newman on 05/02/24 at  6:00 PM EDT by telephone and verified that I am speaking with the correct person using two identifiers.  I discussed the limitations, risks, security and privacy concerns of performing an evaluation and management service by telemedicine and the availability of in-person appointments. I also discussed with the patient that there may be a patient responsible charge related to this service. The patient expressed understanding and agreed to proceed.  Other persons participating in the visit and their role in the encounter: none.  Patient's location: [facility] Provider's location: [facility]  Reason for Visit: follow-up  Treatment History: In the setting of possible hematuria, the patient underwent CT imaging in March which showed 2 nonobstructing left renal calculi measuring up to 6 mm without hydronephrosis.  Severe left renal cortical scarring/atrophy noted.  There was incidental finding of a 4.4 cm simple right ovarian cyst.   The patient was seen initially in early May in the setting of an ovarian cyst that have been seen on recent CT scan (incidental finding).  The cyst was noted to be new since 2019. Patient was seen on 04/06/2024.  On pelvic ultrasound in clinic, uterus measured 4.4 x 4.1 x 2.3 cm with an endometrium of 2.1 mm.  Within the right adnexa, there was a 5.3 x 3.7 cm simple appearing cyst without blood flow seen.  Left ovary not visualized.  No free fluid.   Tumor markers were obtained on 6/27 and notable for: CEA 8.8 CA 125 10 CA 19-9 13 AFP 3.1  Interval History: Doing well.  Past Medical/Surgical History: Past Medical History:  Diagnosis Date   Anal polyp 2005   condyloma acuminatum   Anemia    CKD (chronic kidney disease)    Cr 1.9 -09/2014   Colonic mass    Crohn disease of colon (HCC)    Diverticulitis    with abscess   History of kidney stones    Hypertension    Kidney  stone     Past Surgical History:  Procedure Laterality Date   anal polypectomy  01/02/2004   Dr.Rosenbower- anal polyp removed. bx= condyloma acuminatum   CATARACT EXTRACTION W/PHACO Left 08/10/2018   Procedure: CATARACT EXTRACTION PHACO AND INTRAOCULAR LENS PLACEMENT LEFT EYE;  Surgeon: Perley Hamilton, MD;  Location: [facility];  Service: Ophthalmology;  Laterality: Left;  left   CATARACT EXTRACTION W/PHACO Right 09/04/2018   Procedure: CATARACT EXTRACTION PHACO AND INTRAOCULAR LENS PLACEMENT (IOC);  Surgeon: Perley Hamilton, MD;  Location: [facility];  Service: Ophthalmology;  Laterality: Right;  CDE: 11.16   COLONOSCOPY  04/08/2003   Dr.Mann- small nonbleeding internal and external hemorrhoids small polypoid mass at anal verge, normal appearing L colon, transverse colon, R colon including cecum. stenotic ileocecal valve. bx of maa= benign rectal type mucosa with features of mucosal prolapse syndrome   COLONOSCOPY N/A 04/02/2015   Dr.Rourk- colonic diverticulosis, no evidence of colonic neoplasm. abnormal ileocecal valve with ulceration bx= ulcerated/eroded benign ileocecal valve mucosa   COLONOSCOPY N/A 06/14/2018   scattered diverticula in sigmoid and descending colon. Distal 10 cm of neoterminal ileum appears normal. Appears to have surgical remission   EXTRACORPOREAL SHOCK WAVE LITHOTRIPSY Left 05/10/2023   Procedure: EXTRACORPOREAL SHOCK WAVE LITHOTRIPSY (ESWL);  Surgeon: Sherrilee Belvie CROME, MD;  Location: [facility];  Service: Urology;  Laterality: Left;   FLEXIBLE SIGMOIDOSCOPY  11/06/2003   Dr.Mann- small nodular mass at anal verge, bx done. early L side diverticula. bx=  benign rectal mucosa with features of mucosal prolapse syndrome. no adenomatous changes or evidence of malignancy identified.    IR NEPHROSTOMY PLACEMENT LEFT  01/05/2018   KIDNEY STONE SURGERY     LAPAROSCOPY N/A 11/02/2017   Procedure: LAPAROSCOPIC ILEOCECECTOMY;  Surgeon: Debby Hila, MD;  Location: [facility];  Service: General;   Laterality: N/A;   NEPHROLITHOTOMY Left 02/13/2018   Procedure: NEPHROLITHOTOMY PERCUTANEOUS;  Surgeon: Sherrilee Belvie CROME, MD;  Location: [facility];  Service: Urology;  Laterality: Left;    Family History  Problem Relation Age of Onset   Hypertension Mother    Heart disease Father    Breast cancer Sister    Colon cancer Neg Hx    Colon polyps Neg Hx    Pancreatic cancer Neg Hx    Prostate cancer Neg Hx    Endometrial cancer Neg Hx    Ovarian cancer Neg Hx     Social History   Socioeconomic History   Marital status: Widowed    Spouse name: Not on file   Number of children: Not on file   Years of education: Not on file   Highest education level: Not on file  Occupational History   Not on file  Tobacco Use   Smoking status: Former    Current packs/day: 0.00    Average packs/day: 0.3 packs/day for 5.0 years (1.3 ttl pk-yrs)    Types: Cigarettes    Start date: 01/18/1957    Quit date: 01/18/1962    Years since quitting: 62.3   Smokeless tobacco: Never   Tobacco comments:    1 pack cigarettes per week ; quit in her early 1s   Vaping Use   Vaping status: Never Used  Substance and Sexual Activity   Alcohol use: No   Drug use: Never   Sexual activity: Not Currently    Birth control/protection: Post-menopausal  Other Topics Concern   Not on file  Social History Narrative   Not on file   Social Drivers of Health   Financial Resource Strain: Low Risk  (11/01/2023)   Received from [facility]   Overall Financial Resource Strain (CARDIA)    Difficulty of Paying Living Expenses: Not hard at all  Food Insecurity: No Food Insecurity (11/01/2023)   Received from [facility]   Hunger Vital Sign    Within the past 12 months, you worried that your food would run out before you got the money to buy more.: Never true    Within the past 12 months, the food you bought just didn't last and you didn't have money to get more.: Never true  Transportation Needs: No Transportation Needs  (11/01/2023)   Received from [facility] - Transportation    Lack of Transportation (Medical): No    Lack of Transportation (Non-Medical): No  Physical Activity: Insufficiently Active (08/10/2023)   Received from [facility]   Exercise Vital Sign    On average, how many days per week do you engage in moderate to strenuous exercise (like a brisk walk)?: 3 days    On average, how many minutes do you engage in exercise at this level?: 30 min  Stress: Stress Concern Present (08/10/2023)   Received from [facility] - Occupational Stress Questionnaire    Feeling of Stress : To some extent  Social Connections: Moderately Integrated (08/10/2023)   Received from [facility]   Social Network    How would you rate your social network (  family, work, friends)?: Adequate participation with social networks    Current Medications:  Current Outpatient Medications:    ALPRAZolam (XANAX) 0.25 MG tablet, Take 0.25 mg by mouth., Disp: , Rfl:    amLODipine  (NORVASC ) 10 MG tablet, TAKE (1) TABLET BY MOUTH AT BEDTIME., Disp: 90 tablet, Rfl: 0   cholecalciferol (VITAMIN D) 1000 units tablet, Take 1,000 Units by mouth daily., Disp: , Rfl:    folic acid (FOLVITE) 400 MCG tablet, Take 400 mcg by mouth daily., Disp: , Rfl:    Multiple Vitamins-Minerals (MULTIVITAMIN WITH MINERALS) tablet, Take 1 tablet by mouth daily., Disp: , Rfl:    ondansetron  (ZOFRAN ) 4 MG tablet, Take 1 tablet (4 mg total) by mouth daily as needed for nausea or vomiting. (Patient not taking: Reported on 11/17/2023), Disp: 30 tablet, Rfl: 1   spironolactone (ALDACTONE) 25 MG tablet, Take by mouth., Disp: , Rfl:    vitamin B-12 (CYANOCOBALAMIN) 1000 MCG tablet, Take 1,000 mcg by mouth daily., Disp: , Rfl:   Review of Symptoms: Pertinent positives as per HPI.  Physical Exam: Deferred given limitations of phone visit.  Laboratory & Radiologic Studies: Ref Range & Units (hover) 12 d  ago  CEA [facility]) 6.89 High    Assessment & Plan: Christine Newman is a 83 y.o. woman with an adnexal mass, elevated CEA.  After thinking about it risks and benefits of surgery, the patient would like to move forward with surgery.  I reviewed that her CEA had come down although still elevated.  This is overall reassuring.  We discussed that proceeding with surgery has some risk in the setting of her comorbidities and her age.  She also has had surgery for her Crohn's disease which may make surgery somewhat more complicated if she has intra-abdominal adhesions.  If we do not proceed with surgery now, discussed plan for monitoring of the cyst and repeating CEA.    We reviewed the plan for robotic BSO and sending the enlarged ovary to pathology for frozen section.  If benign, no additional procedures would be performed.  If borderline, we discussed total hysterectomy, peritoneal biopsies, and omentectomy.  In the setting of malignancy, may also perform lymphadenectomy.   We discussed the plan for a robotic bilateral salpingo-oophorectomy, possible staging, possible laparotomy. The risks of surgery were discussed in detail and she understands these to include infection; wound separation; hernia; injury to adjacent organs such as bowel, bladder, blood vessels, ureters and nerves; bleeding which may require blood transfusion; anesthesia risk; thromboembolic events; possible death; unforeseen complications; possible need for re-exploration; medical complications such as heart attack, stroke, pleural effusion and pneumonia; and, if full lymphadenectomy is performed the risk of lymphedema and lymphocyst. The patient will receive DVT and antibiotic prophylaxis as indicated. She voiced a clear understanding. She had the opportunity to ask questions.   She will have a phone visit with one of my nurses for perioperative teaching closer to surgery.  I discussed the assessment and treatment plan with the patient. The  patient was provided with an opportunity to ask questions and all were answered. The patient agreed with the plan and demonstrated an understanding of the instructions.   The patient was advised to call back or see an in-person evaluation if the symptoms worsen or if the condition fails to improve as anticipated.   14 minutes of total time was spent for this patient encounter, including preparation, phone counseling with the patient and coordination of care, and documentation of the encounter.  Comer Dollar, MD  Division of Gynecologic Oncology  Department of Obstetrics and Gynecology  [facility]

## 2024-05-04 ENCOUNTER — Telehealth: Admitting: Gynecologic Oncology

## 2024-05-04 ENCOUNTER — Telehealth: Payer: Self-pay | Admitting: *Deleted

## 2024-05-04 NOTE — Telephone Encounter (Signed)
 Called patient and left a message to see if she was able to change her surgery from 8/7 to 8/6. Asked for patient to call the office  She called back and is able to change date of surgery to 8/6

## 2024-05-07 NOTE — Patient Instructions (Signed)
 SURGICAL WAITING ROOM VISITATION  Patients having surgery or a procedure may have no more than 2 support people in the waiting area - these visitors may rotate.    Children under the age of 66 must have an adult with them who is not the patient.  Visitors with respiratory illnesses are discouraged from visiting and should remain at home.  If the patient needs to stay at the hospital during part of their recovery, the visitor guidelines for inpatient rooms apply. Pre-op nurse will coordinate an appropriate time for 1 support person to accompany patient in pre-op.  This support person may not rotate.    Please refer to the [facility] website for the visitor guidelines for Inpatients (after your surgery is over and you are in a regular room).       Your procedure is scheduled on: 05/16/24   Report to [facility] Main Entrance    Report to admitting at 9:45 AM   Call this number if you have problems the morning of surgery [phone]   Do not eat food :After Midnight.   After Midnight you may have the following liquids until 9 AM DAY OF SURGERY  Water  Non-Citrus Juices (without pulp, NO RED-Apple, White grape, White cranberry) Black Coffee (NO MILK/CREAM OR CREAMERS, sugar ok)  Clear Tea (NO MILK/CREAM OR CREAMERS, sugar ok) regular and decaf                             Plain Jell-O (NO RED)                                           Fruit ices (not with fruit pulp, NO RED)                                     Popsicles (NO RED)                                                               Sports drinks like Gatorade (NO RED)                  FOLLOW BOWEL PREP AND ANY ADDITIONAL PRE OP INSTRUCTIONS YOU RECEIVED FROM YOUR SURGEON'S OFFICE!!!     Oral Hygiene is also important to reduce your risk of infection.                                    Remember - BRUSH YOUR TEETH THE MORNING OF SURGERY WITH YOUR REGULAR TOOTHPASTE  DENTURES WILL BE REMOVED PRIOR TO SURGERY PLEASE  DO NOT APPLY Poly grip OR ADHESIVES!!!   Stop all vitamins and herbal supplements 7 days before surgery.   Take these medicines the morning of surgery with A SIP OF WATER : Amlodipine , Xanax if needed.             You may not have any metal on your body including hair pins, jewelry, and body piercing  Do not wear make-up, lotions, powders, perfumes/cologne, or deodorant  Do not wear nail polish including gel and S&S, artificial/acrylic nails, or any other type of covering on natural nails including finger and toenails. If you have artificial nails, gel coating, etc. that needs to be removed by a nail salon please have this removed prior to surgery or surgery may need to be canceled/ delayed if the surgeon/ anesthesia feels like they are unable to be safely monitored.   Do not shave  48 hours prior to surgery.    Do not bring valuables to the hospital. [facility] IS NOT             RESPONSIBLE   FOR VALUABLES.   Contacts, glasses, dentures or bridgework may not be worn into surgery.   Bring small overnight bag day of surgery.   DO NOT BRING YOUR HOME MEDICATIONS TO THE HOSPITAL. PHARMACY WILL DISPENSE MEDICATIONS LISTED ON YOUR MEDICATION LIST TO YOU DURING YOUR ADMISSION IN THE HOSPITAL!    Patients discharged on the day of surgery will not be allowed to drive home.  Someone NEEDS to stay with you for the first 24 hours after anesthesia.   Special Instructions: Bring a copy of your healthcare power of attorney and living will documents the day of surgery if you haven't scanned them before.              Please read over the following fact sheets you were given: IF YOU HAVE QUESTIONS ABOUT YOUR PRE-OP INSTRUCTIONS PLEASE CALL [phone] [place]   If you received a COVID test during your pre-op visit  it is requested that you wear a mask when out in public, stay away from anyone that may not be feeling well and notify your surgeon if you develop symptoms. If you test positive  for Covid or have been in contact with anyone that has tested positive in the last 10 days please notify you surgeon.    [facility] - Preparing for Surgery Before surgery, you can play an important role.  Because skin is not sterile, your skin needs to be as free of germs as possible.  You can reduce the number of germs on your skin by washing with CHG (chlorahexidine gluconate) soap before surgery.  CHG is an antiseptic cleaner which kills germs and bonds with the skin to continue killing germs even after washing. Please DO NOT use if you have an allergy to CHG or antibacterial soaps.  If your skin becomes reddened/irritated stop using the CHG and inform your nurse when you arrive at Short Stay. Do not shave (including legs and underarms) for at least 48 hours prior to the first CHG shower.  You may shave your face/neck.  Please follow these instructions carefully:  1.  Shower with CHG Soap the night before surgery and the  morning of surgery.  2.  If you choose to wash your hair, wash your hair first as usual with your normal  shampoo.  3.  After you shampoo, rinse your hair and body thoroughly to remove the shampoo.                             4.  Use CHG as you would any other liquid soap.  You can apply chg directly to the skin and wash.  Gently with a scrungie or clean washcloth.  5.  Apply the CHG Soap to your body ONLY FROM THE NECK DOWN.  Do   not use on face/ open                           Wound or open sores. Avoid contact with eyes, ears mouth and   genitals (private parts).                       Wash face,  Genitals (private parts) with your normal soap.             6.  Wash thoroughly, paying special attention to the area where your    surgery  will be performed.  7.  Thoroughly rinse your body with warm water  from the neck down.  8.  DO NOT shower/wash with your normal soap after using and rinsing off the CHG Soap.                9.  Pat yourself dry with a clean towel.             10.  Wear clean pajamas.            11.  Place clean sheets on your bed the night of your first shower and do not  sleep with pets. Day of Surgery : Do not apply any lotions/deodorants the morning of surgery.  Please wear clean clothes to the hospital/surgery center.  FAILURE TO FOLLOW THESE INSTRUCTIONS MAY RESULT IN THE CANCELLATION OF YOUR SURGERY  ________________________________________________________________________ WHAT IS A BLOOD TRANSFUSION? Blood Transfusion Information  A transfusion is the replacement of blood or some of its parts. Blood is made up of multiple cells which provide different functions. Red blood cells carry oxygen  and are used for blood loss replacement. White blood cells fight against infection. Platelets control bleeding. Plasma helps clot blood. Other blood products are available for specialized needs, such as hemophilia or other clotting disorders. BEFORE THE TRANSFUSION  Who gives blood for transfusions?  Healthy volunteers who are fully evaluated to make sure their blood is safe. This is blood bank blood. Transfusion therapy is the safest it has ever been in the practice of medicine. Before blood is taken from a donor, a complete history is taken to make sure that person has no history of diseases nor engages in risky social behavior (examples are intravenous drug use or sexual activity with multiple partners). The donor's travel history is screened to minimize risk of transmitting infections, such as malaria. The donated blood is tested for signs of infectious diseases, such as HIV and hepatitis. The blood is then tested to be sure it is compatible with you in order to minimize the chance of a transfusion reaction. If you or a relative donates blood, this is often done in anticipation of surgery and is not appropriate for emergency situations. It takes many days to process the donated blood. RISKS AND COMPLICATIONS Although transfusion therapy is very safe and  saves many lives, the main dangers of transfusion include:  Getting an infectious disease. Developing a transfusion reaction. This is an allergic reaction to something in the blood you were given. Every precaution is taken to prevent this. The decision to have a blood transfusion has been considered carefully by your caregiver before blood is given. Blood is not given unless the benefits outweigh the risks. AFTER THE TRANSFUSION Right after receiving a blood transfusion, you will usually feel much better and more energetic. This is especially true if your red blood cells have gotten  low (anemic). The transfusion raises the level of the red blood cells which carry oxygen , and this usually causes an energy increase. The nurse administering the transfusion will monitor you carefully for complications. HOME CARE INSTRUCTIONS  No special instructions are needed after a transfusion. You may find your energy is better. Speak with your caregiver about any limitations on activity for underlying diseases you may have. SEEK MEDICAL CARE IF:  Your condition is not improving after your transfusion. You develop redness or irritation at the intravenous (IV) site. SEEK IMMEDIATE MEDICAL CARE IF:  Any of the following symptoms occur over the next 12 hours: Shaking chills. You have a temperature by mouth above 102 F (38.9 C), not controlled by medicine. Chest, back, or muscle pain. People around you feel you are not acting correctly or are confused. Shortness of breath or difficulty breathing. Dizziness and fainting. You get a rash or develop hives. You have a decrease in urine output. Your urine turns a dark color or changes to pink, red, or brown. Any of the following symptoms occur over the next 10 days: You have a temperature by mouth above 102 F (38.9 C), not controlled by medicine. Shortness of breath. Weakness after normal activity. The white part of the eye turns yellow (jaundice). You have a  decrease in the amount of urine or are urinating less often. Your urine turns a dark color or changes to pink, red, or brown. Document Released: 09/24/2000 Document Revised: 12/20/2011 Document Reviewed: 05/13/2008 [facility] Patient Information 2014 [place], [place].

## 2024-05-07 NOTE — Progress Notes (Signed)
 COVID Vaccine received:  []  No [x]  Yes Date of any COVID positive Test in last 90 days: no PCP - Charmaine Heller NP Cardiologist - no  Chest x-ray -  EKG -   Stress Test -  ECHO -  Cardiac Cath -   Bowel Prep - [x]  No  []   Yes ______  Pacemaker / ICD device [x]  No []  Yes   Spinal Cord Stimulator:[x]  No []  Yes       History of Sleep Apnea? [x]  No []  Yes   CPAP used?- [x]  No []  Yes    Does the patient monitor blood sugar?          [x]  No []  Yes  []  N/A  Patient has: [x]  NO Hx DM   []  Pre-DM                 []  DM1  []   DM2 Does patient have a [employer] or Dexacom? []  No []  Yes   Fasting Blood Sugar Ranges-  Checks Blood Sugar _____ times a day  GLP1 agonist / usual dose - no GLP1 instructions:  SGLT-2 inhibitors / usual dose - no SGLT-2 instructions:   Blood Thinner / Instructions:no Aspirin Instructions:no Comments:   Activity level: Patient is able to climb a flight of stairs without difficulty; []  No CP  [x]  No SOB,     Patient can perform ADLs without assistance.   Anesthesia review:   Patient denies shortness of breath, fever, cough and chest pain at PAT appointment.  Patient verbalized understanding and agreement to the Pre-Surgical Instructions that were given to them at this PAT appointment. Patient was also educated of the need to review these PAT instructions again prior to his/her surgery.I reviewed the appropriate phone numbers to call if they have any and questions or concerns.

## 2024-05-08 ENCOUNTER — Telehealth: Payer: Self-pay

## 2024-05-08 ENCOUNTER — Encounter (HOSPITAL_COMMUNITY)
Admission: RE | Admit: 2024-05-08 | Discharge: 2024-05-08 | Disposition: A | Discharge status: Home / Self Care | Source: Ambulatory Visit | Attending: Gynecologic Oncology | Admitting: Gynecologic Oncology

## 2024-05-08 ENCOUNTER — Other Ambulatory Visit: Payer: Self-pay

## 2024-05-08 ENCOUNTER — Encounter (HOSPITAL_COMMUNITY): Payer: Self-pay

## 2024-05-08 VITALS — BP 159/70 | HR 55 | Temp 97.9°F | Resp 16 | Ht 65.0 in | Wt 138.0 lb

## 2024-05-08 DIAGNOSIS — Z01818 Encounter for other preprocedural examination: Secondary | ICD-10-CM | POA: Diagnosis present

## 2024-05-08 DIAGNOSIS — R970 Elevated carcinoembryonic antigen [CEA]: Secondary | ICD-10-CM | POA: Diagnosis not present

## 2024-05-08 DIAGNOSIS — I10 Essential (primary) hypertension: Secondary | ICD-10-CM | POA: Diagnosis not present

## 2024-05-08 DIAGNOSIS — N9489 Other specified conditions associated with female genital organs and menstrual cycle: Secondary | ICD-10-CM | POA: Insufficient documentation

## 2024-05-08 HISTORY — DX: Unspecified osteoarthritis, unspecified site: M19.90

## 2024-05-08 LAB — CBC
HCT: 41 % (ref 36.0–46.0)
Hemoglobin: 12.6 g/dL (ref 12.0–15.0)
MCH: 29.6 pg (ref 26.0–34.0)
MCHC: 30.7 g/dL (ref 30.0–36.0)
MCV: 96.2 fL (ref 80.0–100.0)
Platelets: 265 K/uL (ref 150–400)
RBC: 4.26 MIL/uL (ref 3.87–5.11)
RDW: 14.6 % (ref 11.5–15.5)
WBC: 6 K/uL (ref 4.0–10.5)
nRBC: 0 % (ref 0.0–0.2)

## 2024-05-08 LAB — COMPREHENSIVE METABOLIC PANEL WITH GFR
ALT: 13 U/L (ref 0–44)
AST: 22 U/L (ref 15–41)
Albumin: 3.5 g/dL (ref 3.5–5.0)
Alkaline Phosphatase: 81 U/L (ref 38–126)
Anion gap: 5 (ref 5–15)
BUN: 19 mg/dL (ref 8–23)
CO2: 23 mmol/L (ref 22–32)
Calcium: 10 mg/dL (ref 8.9–10.3)
Chloride: 113 mmol/L — ABNORMAL HIGH (ref 98–111)
Creatinine, Ser: 1.54 mg/dL — ABNORMAL HIGH (ref 0.44–1.00)
GFR, Estimated: 34 mL/min — ABNORMAL LOW (ref >60)
Glucose, Bld: 97 mg/dL (ref 70–99)
Potassium: 3.6 mmol/L (ref 3.5–5.1)
Sodium: 141 mmol/L (ref 135–145)
Total Bilirubin: 0.8 mg/dL (ref 0.0–1.2)
Total Protein: 6.4 g/dL — ABNORMAL LOW (ref 6.5–8.1)

## 2024-05-08 NOTE — Telephone Encounter (Signed)
 Received surgical optimization form back.  Placed forms in folder in dr office

## 2024-05-08 NOTE — Telephone Encounter (Signed)
 Faxed over surgical optimization form to PCP.Christine Charmaine Heller, NP fax #[phone].[email or web address]

## 2024-05-10 ENCOUNTER — Encounter: Payer: Self-pay | Admitting: Gynecologic Oncology

## 2024-05-14 NOTE — Patient Instructions (Signed)
 Preparing for your Surgery  Plan for surgery on May 16, 2024 with Dr. Comer Dollar at [facility] You will be scheduled for robotic assisted laparoscopic bilateral salpingo-oophorectomy (removal of both ovaries and fallopian tubes), possible staging.  Pre-operative Testing -You will receive a phone call from presurgical testing at [facility] to arrange for a pre-operative appointment and lab work.  -Bring your insurance card, copy of an advanced directive if applicable, medication list  -At that visit, you will be asked to sign a consent for a possible blood transfusion in case a transfusion becomes necessary during surgery.  The need for a blood transfusion is rare but having consent is a necessary part of your care.     -You should not be taking blood thinners or aspirin at least ten days prior to surgery unless instructed by your surgeon.  -Do not take supplements such as fish oil (omega 3), red yeast rice, turmeric before your surgery. STOP TAKING AT LEAST 10 DAYS BEFORE SURGERY. You want to avoid medications with aspirin in them including headache powders such as BC or Goody's), Excedrin migraine.  -If you are taking a GLP-1 medication/injection such as Ozempic, Mounjaro, [id number], this needs to be held before surgery for at least 7 days before.  Day Before Surgery at Home -You will be asked to take in a light diet the day before surgery. You will be advised you can have clear liquids up until 3 hours before your surgery.    Eat a light diet the day before surgery.  Examples including soups, broths, toast, yogurt, mashed potatoes.  AVOID GAS PRODUCING FOODS AND BEVERAGES. Things to avoid include carbonated beverages (fizzy beverages, sodas), raw fruits and raw vegetables (uncooked), or beans.   If your bowels are filled with gas, your surgeon will have difficulty visualizing your pelvic organs which increases your surgical risks.  Your role in recovery Your role  is to become active as soon as directed by your doctor, while still giving yourself time to heal.  Rest when you feel tired. You will be asked to do the following in order to speed your recovery:  - Cough and breathe deeply. This helps to clear and expand your lungs and can prevent pneumonia after surgery.  - STAY ACTIVE WHEN YOU GET HOME. Do mild physical activity. Walking or moving your legs help your circulation and body functions return to normal. Do not try to get up or walk alone the first time after surgery.   -If you develop swelling on one leg or the other, pain in the back of your leg, redness/warmth in one of your legs, please call the office or go to the Emergency Room to have a doppler to rule out a blood clot. For shortness of breath, chest pain-seek care in the Emergency Room as soon as possible. - Actively manage your pain. Managing your pain lets you move in comfort. We will ask you to rate your pain on a scale of zero to 10. It is your responsibility to tell your doctor or nurse where and how much you hurt so your pain can be treated.  Special Considerations -If you are diabetic, you may be placed on insulin after surgery to have closer control over your blood sugars to promote healing and recovery.  This does not mean that you will be discharged on insulin.  If applicable, your oral antidiabetics will be resumed when you are tolerating a solid diet.  -Your final pathology results from surgery  should be available around one week after surgery and the results will be relayed to you when available.  -FMLA forms can be faxed to [phone] and please allow 5-7 business days for completion.  Pain Management After Surgery -You will be prescribed your pain medication and bowel regimen medications before surgery so that you can have these available when you are discharged from the hospital. The pain medication is for use ONLY AFTER surgery and a new prescription will not be given.    -Make sure that you have Tylenol  and Ibuprofen IF YOU ARE ABLE TO TAKE THESE MEDICATIONS at home to use on a regular basis after surgery for pain control. We recommend alternating the medications every hour to six hours since they work differently and are processed in the body differently for pain relief.  -Review the attached handout on narcotic use and their risks and side effects.   Bowel Regimen -You will be prescribed Sennakot-S to take nightly to prevent constipation especially if you are taking the narcotic pain medication intermittently.  It is important to prevent constipation and drink adequate amounts of liquids. You can stop taking this medication when you are not taking pain medication and you are back on your normal bowel routine.  Risks of Surgery Risks of surgery are low but include bleeding, infection, damage to surrounding structures, re-operation, blood clots, and very rarely death.   Blood Transfusion Information (For the consent to be signed before surgery)  We will be checking your blood type before surgery so in case of emergencies, we will know what type of blood you would need.                                            WHAT IS A BLOOD TRANSFUSION?  A transfusion is the replacement of blood or some of its parts. Blood is made up of multiple cells which provide different functions. Red blood cells carry oxygen  and are used for blood loss replacement. White blood cells fight against infection. Platelets control bleeding. Plasma helps clot blood. Other blood products are available for specialized needs, such as hemophilia or other clotting disorders. BEFORE THE TRANSFUSION  Who gives blood for transfusions?  You may be able to donate blood to be used at a later date on yourself (autologous donation). Relatives can be asked to donate blood. This is generally not any safer than if you have received blood from a stranger. The same precautions are taken to ensure  safety when a relative's blood is donated. Healthy volunteers who are fully evaluated to make sure their blood is safe. This is blood bank blood. Transfusion therapy is the safest it has ever been in the practice of medicine. Before blood is taken from a donor, a complete history is taken to make sure that person has no history of diseases nor engages in risky social behavior (examples are intravenous drug use or sexual activity with multiple partners). The donor's travel history is screened to minimize risk of transmitting infections, such as malaria. The donated blood is tested for signs of infectious diseases, such as HIV and hepatitis. The blood is then tested to be sure it is compatible with you in order to minimize the chance of a transfusion reaction. If you or a relative donates blood, this is often done in anticipation of surgery and is not appropriate for emergency situations. It takes  many days to process the donated blood. RISKS AND COMPLICATIONS Although transfusion therapy is very safe and saves many lives, the main dangers of transfusion include:  Getting an infectious disease. Developing a transfusion reaction. This is an allergic reaction to something in the blood you were given. Every precaution is taken to prevent this. The decision to have a blood transfusion has been considered carefully by your caregiver before blood is given. Blood is not given unless the benefits outweigh the risks.  AFTER SURGERY INSTRUCTIONS  Return to work: 4-6 weeks if applicable  Activity: 1. Be up and out of the bed during the day.  Take a nap if needed.  You may walk up steps but be careful and use the hand rail.  Stair climbing will tire you more than you think, you may need to stop part way and rest.   2. No lifting or straining for 6 weeks over 10 pounds. No pushing, pulling, straining for 6 weeks.  3. No driving for 4-89 days when the following criteria have been met: Do not drive if you are  taking narcotic pain medicine and make sure that your reaction time has returned.   4. You can shower as soon as the next day after surgery. Shower daily.  Use your regular soap and water  (not directly on the incision) and pat your incision(s) dry afterwards; don't rub.  No tub baths or submerging your body in water  until cleared by your surgeon. If you have the soap that was given to you by pre-surgical testing that was used before surgery, you do not need to use it afterwards because this can irritate your incisions.   5. No sexual activity and nothing in the vagina for 6 weeks.  6. You may experience a small amount of clear drainage from your incisions, which is normal.  If the drainage persists, increases, or changes color please call the office.  7. Do not use creams, lotions, or ointments such as neosporin on your incisions after surgery until advised by your surgeon because they can cause removal of the dermabond glue on your incisions.    8. Take Tylenol  or ibuprofen first for pain if you are able to take these medications and only use narcotic pain medication for severe pain not relieved by the Tylenol  or Ibuprofen.  Monitor your Tylenol  intake to a max of 4,000 mg in a 24 hour period. You can alternate these medications after surgery.  Diet: 1. Low sodium Heart Healthy Diet is recommended but you are cleared to resume your normal (before surgery) diet after your procedure.  2. It is safe to use a laxative, such as Miralax  or Colace, if you have difficulty moving your bowels before surgery. You have been prescribed Sennakot-S to take at bedtime every evening after surgery to keep bowel movements regular and to prevent constipation.    Wound Care: 1. Keep clean and dry.  Shower daily.  Reasons to call the Doctor: Fever - Oral temperature greater than 100.4 degrees Fahrenheit Foul-smelling vaginal discharge Difficulty urinating Nausea and vomiting Increased pain at the site of the  incision that is unrelieved with pain medicine. Difficulty breathing with or without chest pain New calf pain especially if only on one side Sudden, continuing increased vaginal bleeding with or without clots.   Contacts: For questions or concerns you should contact:  Dr. Comer Dollar at [phone]  Eleanor Epps, NP at [phone]  After Hours: call [phone] and have the GYN Oncologist paged/contacted (after 5  pm or on the weekends). You will speak with an after hours RN and let he or she know you have had surgery.  Messages sent via mychart are for non-urgent matters and are not responded to after hours so for urgent needs, please call the after hours number.

## 2024-05-15 ENCOUNTER — Inpatient Hospital Stay: Attending: Gynecologic Oncology | Admitting: Oncology

## 2024-05-15 ENCOUNTER — Telehealth: Payer: Self-pay | Admitting: *Deleted

## 2024-05-15 VITALS — BP 152/80 | HR 53 | Temp 98.1°F | Resp 16 | Ht 65.0 in | Wt 140.0 lb

## 2024-05-15 DIAGNOSIS — R970 Elevated carcinoembryonic antigen [CEA]: Secondary | ICD-10-CM

## 2024-05-15 DIAGNOSIS — N83201 Unspecified ovarian cyst, right side: Secondary | ICD-10-CM

## 2024-05-15 MED ORDER — TRAMADOL HCL 50 MG PO TABS: 50.0000 mg | ORAL_TABLET | Freq: Two times a day (BID) | ORAL | 0 refills | Status: AC | PRN

## 2024-05-15 MED ORDER — SENNOSIDES-DOCUSATE SODIUM 8.6-50 MG PO TABS: 2.0000 | ORAL_TABLET | Freq: Every day | ORAL | 0 refills | Status: AC

## 2024-05-15 NOTE — Telephone Encounter (Signed)
 Spoke with Christine Newman and her son Christine Newman in person while they were here for patient's pre-op visit.  Patient compliant with pre-operative instructions.  Reinforced nothing to eat after midnight. Clear liquids until 0845. Patient to arrive at 0945.  All questions and concerns answered. Patient and her son verbalized understanding. Instructed to call for any needs.

## 2024-05-15 NOTE — Progress Notes (Signed)
 Patient here for a pre-operative appointment prior to her scheduled surgery on 05/16/2024. She is scheduled for a robotic assisted laparoscopic bilateral salpingo-oophorectomy, possible staging. The surgery was discussed in detail.  See after visit summary for additional details.    Discussed post-op pain management in detail including the aspects of the enhanced recovery pathway.  Advised her that a new prescription would be sent in for Tramadol  and it is only to be used for after her upcoming surgery.  We discussed the use of tylenol  post-op and to monitor for a maximum of 4,000 mg in a 24 hour period.  Also prescribed sennakot to be used after surgery and to hold if having loose stools.  Discussed bowel regimen in detail.     Discussed the use of SCDs and measures to take at home to prevent DVT including frequent mobility.  Reportable signs and symptoms of DVT discussed. Post-operative instructions discussed and expectations for after surgery. Incisional care discussed as well including reportable signs and symptoms including erythema, drainage, wound separation.     30 minutes spent with the patient.  Verbalizing understanding of material discussed. No needs or concerns voiced at the end of the visit.   Advised patient to call for any needs.  Advised that her post-operative medications had been prescribed and could be picked up at any time.    This appointment is included in the global surgical bundle as pre-operative teaching and has no charge.

## 2024-05-16 ENCOUNTER — Encounter (HOSPITAL_COMMUNITY)
Admission: RE | Disposition: A | Discharge status: Home / Self Care | Payer: Self-pay | Source: Home / Self Care | Attending: Gynecologic Oncology

## 2024-05-16 ENCOUNTER — Ambulatory Visit (HOSPITAL_COMMUNITY): Payer: Self-pay | Admitting: Physician Assistant

## 2024-05-16 ENCOUNTER — Ambulatory Visit (HOSPITAL_BASED_OUTPATIENT_CLINIC_OR_DEPARTMENT_OTHER): Admitting: Anesthesiology

## 2024-05-16 ENCOUNTER — Other Ambulatory Visit: Payer: Self-pay

## 2024-05-16 ENCOUNTER — Ambulatory Visit (HOSPITAL_COMMUNITY)
Admission: RE | Admit: 2024-05-16 | Discharge: 2024-05-17 | Disposition: A | Discharge status: Home / Self Care | Attending: Gynecologic Oncology | Admitting: Gynecologic Oncology

## 2024-05-16 ENCOUNTER — Encounter (HOSPITAL_COMMUNITY): Payer: Self-pay | Admitting: Gynecologic Oncology

## 2024-05-16 DIAGNOSIS — N9489 Other specified conditions associated with female genital organs and menstrual cycle: Secondary | ICD-10-CM

## 2024-05-16 DIAGNOSIS — I129 Hypertensive chronic kidney disease with stage 1 through stage 4 chronic kidney disease, or unspecified chronic kidney disease: Secondary | ICD-10-CM

## 2024-05-16 DIAGNOSIS — Z87891 Personal history of nicotine dependence: Secondary | ICD-10-CM | POA: Insufficient documentation

## 2024-05-16 DIAGNOSIS — R110 Nausea: Secondary | ICD-10-CM

## 2024-05-16 DIAGNOSIS — R970 Elevated carcinoembryonic antigen [CEA]: Secondary | ICD-10-CM | POA: Diagnosis not present

## 2024-05-16 DIAGNOSIS — N189 Chronic kidney disease, unspecified: Secondary | ICD-10-CM | POA: Diagnosis not present

## 2024-05-16 DIAGNOSIS — R42 Dizziness and giddiness: Secondary | ICD-10-CM

## 2024-05-16 DIAGNOSIS — N838 Other noninflammatory disorders of ovary, fallopian tube and broad ligament: Secondary | ICD-10-CM | POA: Diagnosis not present

## 2024-05-16 DIAGNOSIS — D270 Benign neoplasm of right ovary: Secondary | ICD-10-CM | POA: Diagnosis not present

## 2024-05-16 DIAGNOSIS — N183 Chronic kidney disease, stage 3 unspecified: Secondary | ICD-10-CM | POA: Diagnosis not present

## 2024-05-16 DIAGNOSIS — K5010 Crohn's disease of large intestine without complications: Secondary | ICD-10-CM | POA: Insufficient documentation

## 2024-05-16 DIAGNOSIS — E7800 Pure hypercholesterolemia, unspecified: Secondary | ICD-10-CM

## 2024-05-16 HISTORY — PX: ROBOTIC ASSISTED BILATERAL SALPINGO OOPHERECTOMY: SHX6078

## 2024-05-16 SURGERY — SALPINGO-OOPHORECTOMY, BILATERAL, ROBOT-ASSISTED
Anesthesia: General | Laterality: Bilateral

## 2024-05-16 MED ORDER — ENOXAPARIN SODIUM 30 MG/0.3ML IJ SOSY: 30.0000 mg | PREFILLED_SYRINGE | INTRAMUSCULAR | Status: DC

## 2024-05-16 MED ORDER — ACETAMINOPHEN 500 MG PO TABS
500.0000 mg | ORAL_TABLET | Freq: Four times a day (QID) | ORAL | Status: DC
  Administered 2024-05-16: 500 mg via ORAL
  Filled 2024-05-16: qty 1

## 2024-05-16 MED ORDER — OXYCODONE HCL 5 MG PO TABS: 5.0000 mg | ORAL_TABLET | ORAL | Status: DC | PRN

## 2024-05-16 MED ORDER — LIDOCAINE HCL (PF) 2 % IJ SOLN
INTRAMUSCULAR | Status: DC | PRN
Start: 2024-05-16 — End: 2024-05-16
  Administered 2024-05-16: 100 mg via INTRADERMAL

## 2024-05-16 MED ORDER — LACTATED RINGERS IR SOLN
Status: DC | PRN
  Administered 2024-05-16: 1000 mL

## 2024-05-16 MED ORDER — CELECOXIB 200 MG PO CAPS
200.0000 mg | ORAL_CAPSULE | Freq: Once | ORAL | Status: AC
  Administered 2024-05-16: 200 mg via ORAL
  Filled 2024-05-16: qty 1

## 2024-05-16 MED ORDER — ALPRAZOLAM 0.25 MG PO TABS: 0.1250 mg | ORAL_TABLET | Freq: Every day | ORAL | Status: DC | PRN

## 2024-05-16 MED ORDER — AMISULPRIDE (ANTIEMETIC) 5 MG/2ML IV SOLN
INTRAVENOUS | Status: AC
  Filled 2024-05-16: qty 2

## 2024-05-16 MED ORDER — CHLORHEXIDINE GLUCONATE 0.12 % MT SOLN
15.0000 mL | Freq: Once | OROMUCOSAL | Status: AC
  Administered 2024-05-16: 15 mL via OROMUCOSAL

## 2024-05-16 MED ORDER — HEPARIN SODIUM (PORCINE) 5000 UNIT/ML IJ SOLN
5000.0000 [IU] | INTRAMUSCULAR | Status: AC
  Administered 2024-05-16: 5000 [IU] via SUBCUTANEOUS
  Filled 2024-05-16: qty 1

## 2024-05-16 MED ORDER — ONDANSETRON HCL 4 MG/2ML IJ SOLN: 4.0000 mg | Freq: Four times a day (QID) | INTRAMUSCULAR | Status: DC | PRN

## 2024-05-16 MED ORDER — ONDANSETRON HCL 4 MG/2ML IJ SOLN
INTRAMUSCULAR | Status: AC
  Filled 2024-05-16: qty 2

## 2024-05-16 MED ORDER — ACETAMINOPHEN 500 MG PO TABS
1000.0000 mg | ORAL_TABLET | Freq: Once | ORAL | Status: AC
  Filled 2024-05-16: qty 2

## 2024-05-16 MED ORDER — BUPIVACAINE HCL 0.25 % IJ SOLN
INTRAMUSCULAR | Status: DC | PRN
  Administered 2024-05-16: 30 mL

## 2024-05-16 MED ORDER — MEPERIDINE HCL 25 MG/ML IJ SOLN: 6.2500 mg | INTRAMUSCULAR | Status: DC | PRN

## 2024-05-16 MED ORDER — ROCURONIUM BROMIDE 100 MG/10ML IV SOLN
INTRAVENOUS | Status: DC | PRN
  Administered 2024-05-16: 70 mg via INTRAVENOUS

## 2024-05-16 MED ORDER — ORAL CARE MOUTH RINSE: 15.0000 mL | Freq: Once | OROMUCOSAL | Status: AC

## 2024-05-16 MED ORDER — DEXTROSE-SODIUM CHLORIDE 5-0.45 % IV SOLN
INTRAVENOUS | Status: DC
  Filled 2024-05-16: qty 1000

## 2024-05-16 MED ORDER — ROCURONIUM BROMIDE 10 MG/ML (PF) SYRINGE
PREFILLED_SYRINGE | INTRAVENOUS | Status: AC
  Filled 2024-05-16: qty 10

## 2024-05-16 MED ORDER — OXYCODONE HCL 5 MG PO TABS
5.0000 mg | ORAL_TABLET | Freq: Once | ORAL | Status: AC | PRN
  Administered 2024-05-16: 5 mg via ORAL

## 2024-05-16 MED ORDER — GABAPENTIN 300 MG PO CAPS
300.0000 mg | ORAL_CAPSULE | ORAL | Status: AC
  Administered 2024-05-16: 300 mg via ORAL
  Filled 2024-05-16: qty 1

## 2024-05-16 MED ORDER — FENTANYL CITRATE PF 50 MCG/ML IJ SOSY: 25.0000 ug | PREFILLED_SYRINGE | INTRAMUSCULAR | Status: DC | PRN

## 2024-05-16 MED ORDER — ONDANSETRON HCL 4 MG PO TABS: 4.0000 mg | ORAL_TABLET | Freq: Four times a day (QID) | ORAL | Status: DC | PRN

## 2024-05-16 MED ORDER — FENTANYL CITRATE (PF) 100 MCG/2ML IJ SOLN
INTRAMUSCULAR | Status: DC | PRN
  Administered 2024-05-16 (×3): 50 ug via INTRAVENOUS

## 2024-05-16 MED ORDER — STERILE WATER FOR IRRIGATION IR SOLN
Status: DC | PRN
  Administered 2024-05-16: 1000 mL

## 2024-05-16 MED ORDER — SUGAMMADEX SODIUM 200 MG/2ML IV SOLN
INTRAVENOUS | Status: AC
  Filled 2024-05-16: qty 2

## 2024-05-16 MED ORDER — ACETAMINOPHEN 500 MG PO TABS
1000.0000 mg | ORAL_TABLET | ORAL | Status: AC
  Administered 2024-05-16: 1000 mg via ORAL
  Filled 2024-05-16: qty 2

## 2024-05-16 MED ORDER — EPHEDRINE SULFATE-NACL 50-0.9 MG/10ML-% IV SOSY
PREFILLED_SYRINGE | INTRAVENOUS | Status: DC | PRN
Start: 2024-05-16 — End: 2024-05-16
  Administered 2024-05-16: 10 mg via INTRAVENOUS
  Administered 2024-05-16: 5 mg via INTRAVENOUS

## 2024-05-16 MED ORDER — AMISULPRIDE (ANTIEMETIC) 5 MG/2ML IV SOLN
10.0000 mg | Freq: Once | INTRAVENOUS | Status: AC
  Administered 2024-05-16: 10 mg via INTRAVENOUS

## 2024-05-16 MED ORDER — OXYCODONE HCL 5 MG/5ML PO SOLN: 5.0000 mg | Freq: Once | ORAL | Status: AC | PRN

## 2024-05-16 MED ORDER — TRAMADOL HCL 50 MG PO TABS: 50.0000 mg | ORAL_TABLET | Freq: Four times a day (QID) | ORAL | Status: DC | PRN

## 2024-05-16 MED ORDER — SUGAMMADEX SODIUM 200 MG/2ML IV SOLN
INTRAVENOUS | Status: DC | PRN
  Administered 2024-05-16: 300 mg via INTRAVENOUS

## 2024-05-16 MED ORDER — ONDANSETRON HCL 4 MG/2ML IJ SOLN
4.0000 mg | Freq: Once | INTRAMUSCULAR | Status: AC | PRN
  Administered 2024-05-16: 4 mg via INTRAVENOUS

## 2024-05-16 MED ORDER — PROPOFOL 10 MG/ML IV BOLUS
INTRAVENOUS | Status: DC | PRN
  Administered 2024-05-16: 50 ug/kg/min via INTRAVENOUS
  Administered 2024-05-16: 110 mg via INTRAVENOUS

## 2024-05-16 MED ORDER — FENTANYL CITRATE (PF) 100 MCG/2ML IJ SOLN
INTRAMUSCULAR | Status: AC
Start: 2024-05-16 — End: 2024-05-16
  Filled 2024-05-16: qty 2

## 2024-05-16 MED ORDER — OXYCODONE HCL 5 MG PO TABS
ORAL_TABLET | ORAL | Status: AC
  Filled 2024-05-16: qty 1

## 2024-05-16 MED ORDER — FENTANYL CITRATE (PF) 100 MCG/2ML IJ SOLN
INTRAMUSCULAR | Status: AC
  Filled 2024-05-16: qty 2

## 2024-05-16 MED ORDER — LACTATED RINGERS IV SOLN: INTRAVENOUS | Status: DC

## 2024-05-16 MED ORDER — BUPIVACAINE HCL (PF) 0.25 % IJ SOLN
INTRAMUSCULAR | Status: AC
  Filled 2024-05-16: qty 30

## 2024-05-16 MED ORDER — ONDANSETRON HCL 4 MG/2ML IJ SOLN
INTRAMUSCULAR | Status: DC | PRN
  Administered 2024-05-16: 4 mg via INTRAVENOUS

## 2024-05-16 MED ORDER — DEXAMETHASONE SODIUM PHOSPHATE 10 MG/ML IJ SOLN
4.0000 mg | INTRAMUSCULAR | Status: AC
  Administered 2024-05-16: 6 mg via INTRAVENOUS

## 2024-05-16 MED ORDER — PROPOFOL 10 MG/ML IV BOLUS
INTRAVENOUS | Status: AC
  Filled 2024-05-16: qty 20

## 2024-05-16 MED ORDER — LIDOCAINE HCL (PF) 2 % IJ SOLN
INTRAMUSCULAR | Status: AC
  Filled 2024-05-16: qty 5

## 2024-05-16 MED ORDER — DEXAMETHASONE SODIUM PHOSPHATE 10 MG/ML IJ SOLN
INTRAMUSCULAR | Status: AC
  Filled 2024-05-16: qty 1

## 2024-05-16 SURGICAL SUPPLY — 67 items
APPLICATOR SURGIFLO ENDO (HEMOSTASIS) IMPLANT
BAG COUNTER SPONGE SURGICOUNT (BAG) IMPLANT
BAG LAPAROSCOPIC 12 15 PORT 16 (BASKET) IMPLANT
BLADE SURG SZ10 CARB STEEL (BLADE) IMPLANT
COVER BACK TABLE 60X90IN (DRAPES) ×1 IMPLANT
COVER TIP SHEARS 8 DVNC (MISCELLANEOUS) ×1 IMPLANT
DERMABOND ADVANCED .7 DNX12 (GAUZE/BANDAGES/DRESSINGS) ×1 IMPLANT
DRAPE ARM DVNC X/XI (DISPOSABLE) ×4 IMPLANT
DRAPE COLUMN DVNC XI (DISPOSABLE) ×1 IMPLANT
DRAPE SHEET LG 3/4 BI-LAMINATE (DRAPES) ×1 IMPLANT
DRAPE SURG IRRIG POUCH 19X23 (DRAPES) ×1 IMPLANT
DRIVER NDL MEGA SUTCUT DVNCXI (INSTRUMENTS) ×1 IMPLANT
DRIVER NDLE MEGA SUTCUT DVNCXI (INSTRUMENTS) IMPLANT
DRSG OPSITE POSTOP 4X6 (GAUZE/BANDAGES/DRESSINGS) IMPLANT
DRSG OPSITE POSTOP 4X8 (GAUZE/BANDAGES/DRESSINGS) IMPLANT
ELECT PENCIL ROCKER SW 15FT (MISCELLANEOUS) IMPLANT
ELECT REM PT RETURN 15FT ADLT (MISCELLANEOUS) ×1 IMPLANT
FORCEPS BPLR FENES DVNC XI (FORCEP) ×1 IMPLANT
FORCEPS PROGRASP DVNC XI (FORCEP) ×1 IMPLANT
GAUZE 4X4 16PLY ~~LOC~~+RFID DBL (SPONGE) ×2 IMPLANT
GLOVE BIO SURGEON STRL SZ 6 (GLOVE) ×4 IMPLANT
GLOVE BIO SURGEON STRL SZ 6.5 (GLOVE) ×1 IMPLANT
GOWN STRL REUS W/ TWL LRG LVL3 (GOWN DISPOSABLE) ×4 IMPLANT
GRASPER SUT TROCAR 14GX15 (MISCELLANEOUS) IMPLANT
HOLDER FOLEY CATH W/STRAP (MISCELLANEOUS) IMPLANT
IRRIGATION SUCT STRKRFLW 2 WTP (MISCELLANEOUS) ×1 IMPLANT
KIT PROCEDURE DVNC SI (MISCELLANEOUS) IMPLANT
KIT TURNOVER KIT A (KITS) ×1 IMPLANT
LIGASURE IMPACT 36 18CM CVD LR (INSTRUMENTS) IMPLANT
MANIPULATOR ADVINCU DEL 3.0 PL (MISCELLANEOUS) IMPLANT
MANIPULATOR ADVINCU DEL 3.5 PL (MISCELLANEOUS) IMPLANT
MANIPULATOR UTERINE 4.5 ZUMI (MISCELLANEOUS) IMPLANT
NDL HYPO 21X1.5 SAFETY (NEEDLE) ×1 IMPLANT
NDL SPNL 18GX3.5 QUINCKE PK (NEEDLE) IMPLANT
NEEDLE HYPO 21X1.5 SAFETY (NEEDLE) ×1 IMPLANT
NEEDLE SPNL 18GX3.5 QUINCKE PK (NEEDLE) IMPLANT
OBTURATOR OPTICALSTD 8 DVNC (TROCAR) ×1 IMPLANT
PACK ROBOT GYN CUSTOM WL (TRAY / TRAY PROCEDURE) ×1 IMPLANT
PAD POSITIONING PINK XL (MISCELLANEOUS) ×1 IMPLANT
PORT ACCESS TROCAR AIRSEAL 12 (TROCAR) IMPLANT
SCISSORS MNPLR CVD DVNC XI (INSTRUMENTS) ×1 IMPLANT
SCRUB CHG 4% DYNA-HEX 4OZ (MISCELLANEOUS) IMPLANT
SEAL UNIV 5-12 XI (MISCELLANEOUS) ×4 IMPLANT
SET TRI-LUMEN FLTR TB AIRSEAL (TUBING) ×1 IMPLANT
SPIKE FLUID TRANSFER (MISCELLANEOUS) ×1 IMPLANT
SPONGE T-LAP 18X18 ~~LOC~~+RFID (SPONGE) IMPLANT
SURGIFLO W/THROMBIN 8M KIT (HEMOSTASIS) IMPLANT
SUT MNCRL AB 4-0 PS2 18 (SUTURE) IMPLANT
SUT PDS AB 1 TP1 96 (SUTURE) IMPLANT
SUT STRATA PDS 0 30 CT-2.5 (SUTURE) IMPLANT
SUT V-LOC 180 0-0 GS22 (SUTURE) IMPLANT
SUT VIC AB 0 CT1 27XBRD ANTBC (SUTURE) IMPLANT
SUT VIC AB 2-0 CT1 TAPERPNT 27 (SUTURE) IMPLANT
SUT VIC AB 2-0 SH 27X BRD (SUTURE) IMPLANT
SUT VIC AB 4-0 PS2 18 (SUTURE) ×2 IMPLANT
SUT VICRYL 0 27 CT2 27 ABS (SUTURE) ×1 IMPLANT
SUT VLOC 180 0 9IN GS21 (SUTURE) IMPLANT
SYR 10ML LL (SYRINGE) IMPLANT
SYSTEM BAG RETRIEVAL 10MM (BASKET) IMPLANT
SYSTEM WOUND ALEXIS 18CM MED (MISCELLANEOUS) IMPLANT
TRAP SPECIMEN MUCUS 40CC (MISCELLANEOUS) IMPLANT
TRAY FOLEY MTR SLVR 16FR STAT (SET/KITS/TRAYS/PACK) ×1 IMPLANT
TROCAR PORT AIRSEAL 5X120 (TROCAR) IMPLANT
TROCAR XCEL NON-BLD 5MMX100MML (ENDOMECHANICALS) IMPLANT
UNDERPAD 30X36 HEAVY ABSORB (UNDERPADS AND DIAPERS) ×2 IMPLANT
WATER STERILE IRR 1000ML POUR (IV SOLUTION) ×1 IMPLANT
YANKAUER SUCT BULB TIP 10FT TU (MISCELLANEOUS) IMPLANT

## 2024-05-16 NOTE — Op Note (Signed)
 OPERATIVE NOTE  Pre-operative Diagnosis: Adnexal mass, elevated CEA  Post-operative Diagnosis: same  Operation: Robotic-assisted laparoscopic bilateral salpingo-oophorectomy   Surgeon: Viktoria Crank MD  Assistant Surgeon: Eleanor Epps, NP (an NP assistant was necessary for tissue manipulation, management of robotic instrumentation, retraction and positioning due to the complexity of the case and hospital policies).   Anesthesia: GET  Urine Output: 500 cc  Operative Findings: On EUA, small mobile uterus, 4-5 cm mobile midline mass. On intra-abdominal entry, normal upper abdominal survey. Normal small and large bowel. Uterus 6 cm and normal in appearance. Right ovary replaced by smooth 5 cm cystic mass. Normal left ovary. Normal bilateral fallopian tubes. No ascites. On frozen, simple benign right ovarian cyst.  Estimated Blood Loss:  25 cc      Total IV Fluids: see I&O flowsheet         Specimens: bilateral tubes and ovaries, pelvic washings         Complications:  None apparent; patient tolerated the procedure well.         Disposition: PACU - hemodynamically stable.  Procedure Details  The patient was seen in the Holding Room. The risks, benefits, complications, treatment options, and expected outcomes were discussed with the patient.  The patient concurred with the proposed plan, giving informed consent.  The site of surgery properly noted/marked. The patient was identified as Christine Newman and the procedure verified as a Robotic-assisted bilateral salpingo-oophorectomy with any other indicated procedures.   After induction of anesthesia, the patient was draped and prepped in the usual sterile manner. Patient was placed in supine position after anesthesia and draped and prepped in the usual sterile manner as follows: Her arms were tucked to her side with all appropriate precautions.  The patient was secured to the bed with tape and padding across her chest. The patient was  placed in the semi-lithotomy position in [place] stirrups.  The perineum and vagina were prepped with CHG. The patient's abdomen was prepped with ChloraPrep and then she was draped after the prep had been allowed to dry for 3 minutes.  A Time Out was held and the above information confirmed.  The urethra was prepped with Betadine . Foley catheter was placed. A spongestick was placed in the vagina. OG tube placement was confirmed and to suction.   Next, a 10 mm skin incision was made 1 cm below the subcostal margin in the midclavicular line.  The 5 mm Optiview port and scope was used for direct entry.  Opening pressure was under 10 mm CO2.  The abdomen was insufflated and the findings were noted as above.   At this point and all points during the procedure, the patient's intra-abdominal pressure did not exceed 15 mmHg. Next, an 8 mm skin incision was made superior to the umbilicus and a right and left port were placed about 8 cm lateral to the robot port on the right and left side.  The 5 mm assist trocar was exchanged for a 12 mm airseal port. All ports were placed under direct visualization.  The patient was placed in steep Trendelenburg.  Bowel was folded away into the upper abdomen.  The robot was docked in the normal manner.  Pelvic washings were collected.  The right and left peritoneum were opened parallel to the IP ligament to open the retroperitoneal spaces bilaterally. The round ligaments were preserved. The ureter was noted to be on the medial leaf of the broad ligament.  The peritoneum above the ureter was incised and stretched  and the infundibulopelvic ligament was skeletonized, cauterized and cut.  The utero-ovarian ligament and fallopian tube were skeletonized, cauterized and transected just lateral to the uterine fundus, freeing the adnexa. Each adnexa was placed in an Endocatch bag.   Irrigation was used and excellent hemostasis was achieved.    At this point in the procedure was completed.   Robotic instruments were removed under direct visulaization.  The robot was undocked.   Each adnexa was removed in its respective endocatch bag with contained drainage of right ovarian cyst in the bag. The right adnexa was then handed off the field and sent for frozen section.  Once frozen section returned, the fascia at the 12 mm port was closed with 0 Vicryl using a PMI fascial closure device under direct visualization.  The subcuticular tissue was closed with 4-0 Vicryl and the skin was closed with 4-0 Monocryl in a subcuticular manner.  Dermabond was applied.    The vagina was swabbed with minimal bleeding noted. Foley catheter was removed.  All sponge, lap and needle counts were correct x  3.   The patient was transferred to the recovery room in stable condition.  Comer Dollar, MD

## 2024-05-16 NOTE — Anesthesia Postprocedure Evaluation (Signed)
 Anesthesia Post Note  Patient: [name] Phlegm  Procedure(s) Performed: SALPINGO-OOPHORECTOMY, BILATERAL, ROBOT-ASSISTED (Bilateral)     Patient location during evaluation: PACU Anesthesia Type: General Level of consciousness: awake and alert Pain management: pain level controlled Vital Signs Assessment: post-procedure vital signs reviewed and stable Respiratory status: spontaneous breathing, nonlabored ventilation, respiratory function stable and patient connected to nasal cannula oxygen  Cardiovascular status: blood pressure returned to baseline and stable Postop Assessment: no apparent nausea or vomiting Anesthetic complications: no   No notable events documented.  Last Vitals:  Vitals:   05/16/24 1745 05/16/24 1804  BP:  (!) 169/85  Pulse: 71 66  Resp: 17 16  Temp: (!) 36.1 C (!) 36.2 C  SpO2: 97% 99%    Last Pain:  Vitals:   05/16/24 1804  TempSrc:   PainSc: 0-No pain                 Franky JONETTA Bald

## 2024-05-16 NOTE — Transfer of Care (Signed)
 Immediate Anesthesia Transfer of Care Note  Patient: Christine Newman  Procedure(s) Performed: SALPINGO-OOPHORECTOMY, BILATERAL, ROBOT-ASSISTED (Bilateral)  Patient Location: PACU  Anesthesia Type:General  Level of Consciousness: drowsy  Airway & Oxygen  Therapy: Patient Spontanous Breathing and Patient connected to face mask oxygen   Post-op Assessment: Report given to RN and Post -op Vital signs reviewed and stable  Post vital signs: Reviewed and stable  Last Vitals:  Vitals Value Taken Time  BP 153/74 05/16/24 14:52  Temp    Pulse 71 05/16/24 14:56  Resp 16 05/16/24 14:56  SpO2 100 % 05/16/24 14:56  Vitals shown include unfiled device data.  Last Pain:  Vitals:   05/16/24 0958  TempSrc: Oral  PainSc: 0-No pain         Complications: No notable events documented.

## 2024-05-16 NOTE — Interval H&P Note (Signed)
 History and Physical Interval Note:  05/16/2024 12:17 PM  Christine Newman  has presented today for surgery, with the diagnosis of ADNEXAL MASS,ELEVATED CEA.  The various methods of treatment have been discussed with the patient and family. After consideration of risks, benefits and other options for treatment, the patient has consented to  Procedure(s): SALPINGO-OOPHORECTOMY, BILATERAL, ROBOT-ASSISTED (Bilateral) as a surgical intervention.  The patient's history has been reviewed, patient examined, no change in status, stable for surgery.  I have reviewed the patient's chart and labs.  Questions were answered to the patient's satisfaction.     Comer JONELLE Dollar

## 2024-05-16 NOTE — Anesthesia Procedure Notes (Signed)
 Procedure Name: Intubation Date/Time: 05/16/2024 1:13 PM  Performed by: Delores Duwaine SAUNDERS, CRNAPre-anesthesia Checklist: Patient identified, Emergency Drugs available, Suction available and Patient being monitored Patient Re-evaluated:Patient Re-evaluated prior to induction Oxygen  Delivery Method: Circle System Utilized Preoxygenation: Pre-oxygenation with 100% oxygen  Induction Type: IV induction Ventilation: Mask ventilation without difficulty Laryngoscope Size: Mac and 3 Grade View: Grade I Tube type: Oral Tube size: 7.0 mm Number of attempts: 1 Airway Equipment and Method: Stylet and Oral airway Placement Confirmation: ETT inserted through vocal cords under direct vision, positive ETCO2 and breath sounds checked- equal and bilateral Secured at: 21 cm Tube secured with: Tape Dental Injury: Teeth and Oropharynx as per pre-operative assessment

## 2024-05-16 NOTE — Discharge Instructions (Addendum)
 AFTER SURGERY INSTRUCTIONS   Return to work: 4-6 weeks if applicable  Today Dr. Viktoria removed both ovaries and fallopian tubes. The ovarian cyst was on the right. When the pathologist looked at the cyst, he did not feel it appeared cancerous and we will await results of final pathology to confirm this.    Activity: 1. Be up and out of the bed during the day.  Take a nap if needed.  You may walk up steps but be careful and use the hand rail.  Stair climbing will tire you more than you think, you may need to stop part way and rest.    2. No lifting or straining for 6 weeks over 10 pounds. No pushing, pulling, straining for 6 weeks.   3. No driving for 4-89 days when the following criteria have been met: Do not drive if you are taking narcotic pain medicine and make sure that your reaction time has returned.    4. You can shower as soon as the next day after surgery. Shower daily.  Use your regular soap and water  (not directly on the incision) and pat your incision(s) dry afterwards; don't rub.  No tub baths or submerging your body in water  until cleared by your surgeon. If you have the soap that was given to you by pre-surgical testing that was used before surgery, you do not need to use it afterwards because this can irritate your incisions.    5. No sexual activity and nothing in the vagina for 6 weeks.   6. You may experience a small amount of clear drainage from your incisions, which is normal.  If the drainage persists, increases, or changes color please call the office.   7. Do not use creams, lotions, or ointments such as neosporin on your incisions after surgery until advised by your surgeon because they can cause removal of the dermabond glue on your incisions.     8. Take Tylenol  or ibuprofen first for pain if you are able to take these medications and only use narcotic pain medication for severe pain not relieved by the Tylenol  or Ibuprofen.  Monitor your Tylenol  intake to a max of  4,000 mg in a 24 hour period. You can alternate these medications after surgery.   Diet: 1. Low sodium Heart Healthy Diet is recommended but you are cleared to resume your normal (before surgery) diet after your procedure.   2. It is safe to use a laxative, such as Miralax  or Colace, if you have difficulty moving your bowels before surgery. You have been prescribed Sennakot-S to take at bedtime every evening after surgery to keep bowel movements regular and to prevent constipation.     Wound Care: 1. Keep clean and dry.  Shower daily.   Reasons to call the Doctor: Fever - Oral temperature greater than 100.4 degrees Fahrenheit Foul-smelling vaginal discharge Difficulty urinating Nausea and vomiting Increased pain at the site of the incision that is unrelieved with pain medicine. Difficulty breathing with or without chest pain New calf pain especially if only on one side Sudden, continuing increased vaginal bleeding with or without clots.   Contacts: For questions or concerns you should contact:   Dr. Comer Viktoria at [phone]   Eleanor Epps, NP at [phone]   After Hours: call [phone] and have the GYN Oncologist paged/contacted (after 5 pm or on the weekends). You will speak with an after hours RN and let he or she know you have had surgery.   Messages  sent via mychart are for non-urgent matters and are not responded to after hours so for urgent needs, please call the after hours number.

## 2024-05-16 NOTE — Plan of Care (Signed)
   Problem: Activity: Goal: Risk for activity intolerance will decrease Outcome: Progressing   Problem: Pain Managment: Goal: General experience of comfort will improve and/or be controlled Outcome: Progressing   Problem: Safety: Goal: Ability to remain free from injury will improve Outcome: Progressing

## 2024-05-16 NOTE — Progress Notes (Signed)
 Patient OOB to bathroom with assitance, states she is too sick to go home, complaining of dizziness and nausea but returns to sleep , Dr Viktoria called with information and orders to keep patient over night taken

## 2024-05-16 NOTE — Anesthesia Preprocedure Evaluation (Signed)
 Anesthesia Evaluation  Patient identified by MRN, date of birth, ID band Patient awake    Reviewed: Allergy & Precautions, NPO status , Patient's Chart, lab work & pertinent test results  Airway Mallampati: II  TM Distance: >3 FB Neck ROM: Full    Dental no notable dental hx.    Pulmonary neg pulmonary ROS, former smoker   Pulmonary exam normal breath sounds clear to auscultation       Cardiovascular hypertension, Pt. on medications Normal cardiovascular exam Rhythm:Regular Rate:Normal     Neuro/Psych  PSYCHIATRIC DISORDERS Anxiety     negative neurological ROS  negative psych ROS   GI/Hepatic negative GI ROS, Neg liver ROS,,,  Endo/Other  negative endocrine ROS    Renal/GU Renal InsufficiencyRenal disease  negative genitourinary   Musculoskeletal negative musculoskeletal ROS (+) Arthritis ,    Abdominal   Peds negative pediatric ROS (+)  Hematology negative hematology ROS (+) Blood dyscrasia, anemia   Anesthesia Other Findings   Reproductive/Obstetrics negative OB ROS                              Anesthesia Physical Anesthesia Plan  ASA: 3  Anesthesia Plan: General   Post-op Pain Management: Minimal or no pain anticipated   Induction: Intravenous  PONV Risk Score and Plan: 3 and Ondansetron , Dexamethasone  and Treatment may vary due to age or medical condition  Airway Management Planned: Oral ETT  Additional Equipment: None  Intra-op Plan:   Post-operative Plan: Extubation in OR  Informed Consent: I have reviewed the patients History and Physical, chart, labs and discussed the procedure including the risks, benefits and alternatives for the proposed anesthesia with the patient or authorized representative who has indicated his/her understanding and acceptance.     Dental advisory given  Plan Discussed with: CRNA and Anesthesiologist  Anesthesia Plan Comments:           Anesthesia Quick Evaluation

## 2024-05-17 ENCOUNTER — Encounter (HOSPITAL_COMMUNITY): Payer: Self-pay | Admitting: Gynecologic Oncology

## 2024-05-17 DIAGNOSIS — R970 Elevated carcinoembryonic antigen [CEA]: Secondary | ICD-10-CM

## 2024-05-17 DIAGNOSIS — D270 Benign neoplasm of right ovary: Secondary | ICD-10-CM | POA: Diagnosis not present

## 2024-05-17 DIAGNOSIS — N9489 Other specified conditions associated with female genital organs and menstrual cycle: Secondary | ICD-10-CM

## 2024-05-17 LAB — BASIC METABOLIC PANEL WITH GFR
Anion gap: 8 (ref 5–15)
BUN: 15 mg/dL (ref 8–23)
CO2: 23 mmol/L (ref 22–32)
Calcium: 9.2 mg/dL (ref 8.9–10.3)
Chloride: 110 mmol/L (ref 98–111)
Creatinine, Ser: 1.5 mg/dL — ABNORMAL HIGH (ref 0.44–1.00)
GFR, Estimated: 35 mL/min — ABNORMAL LOW (ref >60)
Glucose, Bld: 122 mg/dL — ABNORMAL HIGH (ref 70–99)
Potassium: 4.4 mmol/L (ref 3.5–5.1)
Sodium: 141 mmol/L (ref 135–145)

## 2024-05-17 LAB — TYPE AND SCREEN
ABO/RH(D): O NEG
Antibody Screen: NEGATIVE

## 2024-05-17 LAB — CBC
HCT: 36.9 % (ref 36.0–46.0)
Hemoglobin: 11.1 g/dL — ABNORMAL LOW (ref 12.0–15.0)
MCH: 29.3 pg (ref 26.0–34.0)
MCHC: 30.1 g/dL (ref 30.0–36.0)
MCV: 97.4 fL (ref 80.0–100.0)
Platelets: 196 K/uL (ref 150–400)
RBC: 3.79 MIL/uL — ABNORMAL LOW (ref 3.87–5.11)
RDW: 14.8 % (ref 11.5–15.5)
WBC: 5.5 K/uL (ref 4.0–10.5)
nRBC: 0 % (ref 0.0–0.2)

## 2024-05-17 LAB — ABO/RH: ABO/RH(D): O NEG

## 2024-05-17 NOTE — Discharge Summary (Signed)
 Physician Discharge Summary  Patient ID: Christine Newman MRN: [id number] DOB/AGE: 11-25-1940 83 y.o.  Admit date: 05/16/2024 Discharge date: 05/17/2024  Admission Diagnoses: Dizziness  Discharge Diagnoses:  Principal Problem:   Dizziness Active Problems:   Adnexal mass   Elevated carcinoembryonic antigen (CEA)   Discharged Condition:  The patient is in good condition and stable for discharge.  stable  Hospital Course: The patient presented on 8/6 for robotic BSO in the setting of a simple adnexal mass with elevated CEA. Surgery was uncomplicated with an EBL of 25 cc. Frozen section was consistent with a benign ovarian cyst. In the PACU, the patient endorsed considerable dizziness and some nausea. Decision was made to observe overnight. On POD#1, the patient reports feeling much better. She was voiding without difficulty, ambulating without dizziness, and tolerating PO without nausea or emesis. She was not endorsing flatus.   Consults: none  Significant Diagnostic Studies:     Latest Ref Rng & Units 05/17/2024    4:49 AM 05/08/2024   10:47 AM 07/31/2021    9:36 AM  CBC  WBC 4.0 - 10.5 K/uL 5.5  6.0  6.1   Hemoglobin 12.0 - 15.0 g/dL 88.8  87.3  85.1   Hematocrit 36.0 - 46.0 % 36.9  41.0  45.6   Platelets 150 - 400 K/uL 196  265  245       Latest Ref Rng & Units 05/17/2024    4:49 AM 05/08/2024   10:47 AM 01/05/2024   12:19 PM  BMP  Glucose 70 - 99 mg/dL 877  97    BUN 8 - 23 mg/dL 15  19    Creatinine 9.55 - 1.00 mg/dL 8.49  8.45  8.19   Sodium 135 - 145 mmol/L 141  141    Potassium 3.5 - 5.1 mmol/L 4.4  3.6    Chloride 98 - 111 mmol/L 110  113    CO2 22 - 32 mmol/L 23  23    Calcium 8.9 - 10.3 mg/dL 9.2  89.9     Treatments: surgery on 8/6 - robotic BSO  Discharge Exam: Blood pressure 129/67, pulse (!) 58, temperature 98.3 F (36.8 C), resp. rate 15, height 5' 5 (1.651 m), weight 140 lb (63.5 kg), SpO2 97%. Gen: no acute distress, alert and oriented HEENT:  normocephalic, atraumatic Pulm: unlabored breathing on room air, lungs clear to auscultation bilaterally, no wheezes or rhonchi CV: regular rate and rhythm (HR in 60s), no murmurs or rubs appreciated Abd: soft, non distended, non tender, + BS, incisions intact with dermabond in place Ext: warm and well perfused, no calf edema  Disposition: Discharge disposition: 01-Home or Self Care       Discharge Instructions     Call MD for:  difficulty breathing, headache or visual disturbances   Complete by: As directed    Call MD for:  extreme fatigue   Complete by: As directed    Call MD for:  hives   Complete by: As directed    Call MD for:  persistant dizziness or light-headedness   Complete by: As directed    Call MD for:  persistant nausea and vomiting   Complete by: As directed    Call MD for:  redness, tenderness, or signs of infection (pain, swelling, redness, odor or green/yellow discharge around incision site)   Complete by: As directed    Call MD for:  severe uncontrolled pain   Complete by: As directed    Call MD for:  temperature >100.4   Complete by: As directed    Diet - low sodium heart healthy   Complete by: As directed    Driving Restrictions   Complete by: As directed    No driving for 4-89 days until the following criteria have been met: Do not take narcotics and drive. You need to make sure your reaction time has returned.   Increase activity slowly   Complete by: As directed    Lifting restrictions   Complete by: As directed    No lifting greater than 10 lbs, pushing, pulling, straining for 6 weeks.   Sexual Activity Restrictions   Complete by: As directed    No sexual activity, nothing in the vagina, for 4-6 weeks.      Allergies as of 05/17/2024       Reactions   Metoprolol     Bradycardia        Medication List     TAKE these medications    ALPRAZolam  0.25 MG tablet Commonly known as: XANAX  Take 0.125-0.25 mg by mouth daily as needed for  anxiety.   amLODipine  10 MG tablet Commonly known as: NORVASC  TAKE (1) TABLET BY MOUTH AT BEDTIME.   cholecalciferol 1000 units tablet Commonly known as: VITAMIN D Take 1,000 Units by mouth daily.   cyanocobalamin 1000 MCG tablet Commonly known as: VITAMIN B12 Take 1,000 mcg by mouth daily.   folic acid 400 MCG tablet Commonly known as: FOLVITE Take 400 mcg by mouth daily.   multivitamin with minerals tablet Take 1 tablet by mouth daily.   Potassium Citrate  15 MEQ (1620 MG) Tbcr Take 1 tablet by mouth daily.   senna-docusate 8.6-50 MG tablet Commonly known as: Senokot-S Take 2 tablets by mouth at bedtime. For AFTER surgery, do not take if having diarrhea   spironolactone 25 MG tablet Commonly known as: ALDACTONE Take 25 mg by mouth daily.   traMADol  50 MG tablet Commonly known as: ULTRAM  Take 1 tablet (50 mg total) by mouth every 12 (twelve) hours as needed for severe pain (pain score 7-10). For AFTER surgery only, do not take and drive        Follow-up Information     Viktoria Comer SAUNDERS, MD Follow up on 05/23/2024.   Specialty: Gynecologic Oncology Why: Plan for a phone call visit with Dr. Viktoria on 05/23/24 to check in and discuss pathology. IN PERSON visit will be on 06/21/24 Contact information: [address] [place] [address] [phone]                 Greater than thirty minutes were spend for face to face discharge instructions and discharge orders/summary in EPIC.   Signed: Comer SAUNDERS Viktoria 05/17/2024, 8:39 AM

## 2024-05-17 NOTE — TOC Transition Note (Signed)
 Transition of Care [facility]) - Discharge Note   Patient Details  Name: Christine Newman MRN: [id number] Date of Birth: [birth year]  [facility] CM/SW Contact:  Alfonse JONELLE Rex, RN Phone Number: 05/17/2024, 9:47 AM   Clinical Narrative:  DC to home orders. No TOC consults/needs.            Patient Goals and CMS Choice            Discharge Placement                       Discharge Plan and Services Additional resources added to the After Visit Summary for                                       Social Drivers of Health (SDOH) Interventions SDOH Screenings   Food Insecurity: No Food Insecurity (05/16/2024)  Housing: Low Risk  (05/16/2024)  Transportation Needs: No Transportation Needs (05/16/2024)  Utilities: Not At Risk (05/16/2024)  Alcohol Screen: Low Risk  (08/05/2022)  Depression (PHQ2-9): Low Risk  (08/05/2022)  Financial Resource Strain: Low Risk  (11/01/2023)   Received from [facility]  Physical Activity: Insufficiently Active (08/10/2023)   Received from [facility]  Social Connections: Moderately Isolated (05/16/2024)  Stress: Stress Concern Present (08/10/2023)   Received from [facility]  Tobacco Use: Medium Risk (05/16/2024)     Readmission Risk Interventions     No data to display

## 2024-05-17 NOTE — Progress Notes (Signed)
 Christine Newman to be D/C'd Home per MD order.  Discussed with the patient and all questions fully answered.  IV catheters discontinued intact. Site without signs and symptoms of complications. Dressing and pressure applied.  An After Visit Summary was printed and given to the patient. Patient received incentive spirometer with teaching and return demonstration.  D/c education completed with patient/family including follow up instructions, medication list, d/c activities limitations if indicated, with other d/c instructions as indicated by MD - patient able to verbalize understanding, all questions fully answered.   Patient instructed to return to ED, call 911, or call MD for any changes in condition.   Patient escorted via WC, and D/C home via private auto.  Christine Newman 05/17/2024 9:19 AM

## 2024-05-18 ENCOUNTER — Ambulatory Visit: Payer: Self-pay | Admitting: Gynecologic Oncology

## 2024-05-18 ENCOUNTER — Telehealth: Payer: Self-pay | Admitting: *Deleted

## 2024-05-18 LAB — SURGICAL PATHOLOGY

## 2024-05-18 LAB — CYTOLOGY - NON PAP

## 2024-05-18 NOTE — Telephone Encounter (Signed)
 Spoke with Ms. Piggott this morning. She states she is eating, drinking and urinating well. She has had 2 good bowel movements and is passing gas. She is taking senokot as prescribed and encouraged her to drink plenty of water . She denies fever or chills. Incisions are dry and intact. She states she is not having any pain only soreness at the incision above the umbilicus and the upper left abdominal incision only and has not needed to take anything for the soreness. Maybe rates the soreness 2/10.   Instructed to call office with any fever, chills, purulent drainage, uncontrolled pain or any other questions or concerns. Patient verbalizes understanding.   Pt aware of post op appointments as well as the office number [phone] and after hours number [phone] to call if she has any questions or concerns

## 2024-05-23 ENCOUNTER — Inpatient Hospital Stay (HOSPITAL_BASED_OUTPATIENT_CLINIC_OR_DEPARTMENT_OTHER): Admitting: Gynecologic Oncology

## 2024-05-23 ENCOUNTER — Encounter: Payer: Self-pay | Admitting: Gynecologic Oncology

## 2024-05-23 DIAGNOSIS — N83201 Unspecified ovarian cyst, right side: Secondary | ICD-10-CM

## 2024-05-23 DIAGNOSIS — R970 Elevated carcinoembryonic antigen [CEA]: Secondary | ICD-10-CM

## 2024-05-23 NOTE — Progress Notes (Signed)
 Gynecologic Oncology Telehealth Note: Gyn-Onc  I connected with [name] Phlegm on 05/23/24 at  6:00 PM EDT by telephone and verified that I am speaking with the correct person using two identifiers.  I discussed the limitations, risks, security and privacy concerns of performing an evaluation and management service by telemedicine and the availability of in-person appointments. I also discussed with the patient that there may be a patient responsible charge related to this service. The patient expressed understanding and agreed to proceed.  Other persons participating in the visit and their role in the encounter: none.  Patient's location: home, [place] Provider's location: [facility], [place]  Reason for Visit: follow-up  Treatment History: In the setting of possible hematuria, the patient underwent CT imaging in March which showed 2 nonobstructing left renal calculi measuring up to 6 mm without hydronephrosis.  Severe left renal cortical scarring/atrophy noted.  There was incidental finding of a 4.4 cm simple right ovarian cyst.   The patient was seen initially in early May in the setting of an ovarian cyst that have been seen on recent CT scan (incidental finding).  The cyst was noted to be new since 2019. Patient was seen on 04/06/2024.  On pelvic ultrasound in clinic, uterus measured 4.4 x 4.1 x 2.3 cm with an endometrium of 2.1 mm.  Within the right adnexa, there was a 5.3 x 3.7 cm simple appearing cyst without blood flow seen.  Left ovary not visualized.  No free fluid.   Tumor markers were obtained on 6/27 and notable for: CEA 8.8 CA 125 10 CA 19-9 13 AFP 3.1  Repeat CEA 6.89  05/16/24: Robotic-assisted laparoscopic bilateral salpingo-oophorectomy   Interval History: Doing well.  Denies pain, just a little sore at LUQ incision. Bowels moving well.  Past Medical/Surgical History: Past Medical History:  Diagnosis Date   Anal polyp 2005   condyloma acuminatum   Anemia    Arthritis     CKD (chronic kidney disease)    Cr 1.9 -09/2014   Colonic mass    Crohn disease of colon (HCC)    Diverticulitis    with abscess   History of kidney stones    Hypertension    Kidney stone     Past Surgical History:  Procedure Laterality Date   anal polypectomy  01/02/2004   Dr.Rosenbower- anal polyp removed. bx= condyloma acuminatum   CATARACT EXTRACTION W/PHACO Left 08/10/2018   Procedure: CATARACT EXTRACTION PHACO AND INTRAOCULAR LENS PLACEMENT LEFT EYE;  Surgeon: Perley Hamilton, MD;  Location: [facility];  Service: Ophthalmology;  Laterality: Left;  left   CATARACT EXTRACTION W/PHACO Right 09/04/2018   Procedure: CATARACT EXTRACTION PHACO AND INTRAOCULAR LENS PLACEMENT (IOC);  Surgeon: Perley Hamilton, MD;  Location: [facility];  Service: Ophthalmology;  Laterality: Right;  CDE: 11.16   COLONOSCOPY  04/08/2003   Dr.Mann- small nonbleeding internal and external hemorrhoids small polypoid mass at anal verge, normal appearing L colon, transverse colon, R colon including cecum. stenotic ileocecal valve. bx of maa= benign rectal type mucosa with features of mucosal prolapse syndrome   COLONOSCOPY N/A 04/02/2015   Dr.Rourk- colonic diverticulosis, no evidence of colonic neoplasm. abnormal ileocecal valve with ulceration bx= ulcerated/eroded benign ileocecal valve mucosa   COLONOSCOPY N/A 06/14/2018   scattered diverticula in sigmoid and descending colon. Distal 10 cm of neoterminal ileum appears normal. Appears to have surgical remission   EXTRACORPOREAL SHOCK WAVE LITHOTRIPSY Left 05/10/2023   Procedure: EXTRACORPOREAL SHOCK WAVE LITHOTRIPSY (ESWL);  Surgeon: Sherrilee Belvie CROME, MD;  Location:  [facility];  Service: Urology;  Laterality: Left;   FLEXIBLE SIGMOIDOSCOPY  11/06/2003   Dr.Mann- small nodular mass at anal verge, bx done. early L side diverticula. bx= benign rectal mucosa with features of mucosal prolapse syndrome. no adenomatous changes or evidence of malignancy identified.    IR NEPHROSTOMY PLACEMENT  LEFT  01/05/2018   KIDNEY STONE SURGERY     LAPAROSCOPY N/A 11/02/2017   Procedure: LAPAROSCOPIC ILEOCECECTOMY;  Surgeon: Debby Hila, MD;  [facility]: [facility];  Service: General;  Laterality: N/A;   NEPHROLITHOTOMY Left 02/13/2018   Procedure: NEPHROLITHOTOMY PERCUTANEOUS;  Surgeon: Sherrilee Belvie CROME, MD;  Location: [facility];  Service: Urology;  Laterality: Left;   ROBOTIC ASSISTED BILATERAL SALPINGO OOPHERECTOMY Bilateral 05/16/2024   Procedure: SALPINGO-OOPHORECTOMY, BILATERAL, ROBOT-ASSISTED;  Surgeon: Viktoria Comer SAUNDERS, MD;  [facility];  Service: Gynecology;  Laterality: Bilateral;    Family History  Problem Relation Age of Onset   Hypertension Mother    Heart disease Father    Breast cancer Sister    Colon cancer Neg Hx    Colon polyps Neg Hx    Pancreatic cancer Neg Hx    Prostate cancer Neg Hx    Endometrial cancer Neg Hx    Ovarian cancer Neg Hx     Social History   Socioeconomic History   Marital status: Widowed    Spouse name: Not on file   Number of children: Not on file   Years of education: Not on file   Highest education level: Not on file  Occupational History   Not on file  Tobacco Use   Smoking status: Former    Current packs/day: 0.00    Average packs/day: 0.3 packs/day for 5.0 years (1.3 ttl pk-yrs)    Types: Cigarettes    Start date: 01/18/1957    Quit date: 01/18/1962    Years since quitting: 62.3   Smokeless tobacco: Never   Tobacco comments:    1 pack cigarettes per week ; quit in her early 57s   Vaping Use   Vaping status: Never Used  Substance and Sexual Activity   Alcohol use: No   Drug use: Never   Sexual activity: Not Currently    Birth control/protection: Post-menopausal  Other Topics Concern   Not on file  Social History Narrative   Not on file   Social Drivers of Health   Financial Resource Strain: Low Risk  (11/01/2023)   Received from [facility]   Overall Financial Resource Strain (CARDIA)    Difficulty of Paying Living  Expenses: Not hard at all  Food Insecurity: No Food Insecurity (05/16/2024)   Hunger Vital Sign    Worried About Running Out of Food in the Last Year: Never true    Ran Out of Food in the Last Year: Never true  Transportation Needs: No Transportation Needs (05/16/2024)   PRAPARE - Administrator, Civil Service (Medical): No    Lack of Transportation (Non-Medical): No  Physical Activity: Insufficiently Active (08/10/2023)   Received from [facility]   Exercise Vital Sign    On average, how many days per week do you engage in moderate to strenuous exercise (like a brisk walk)?: 3 days    On average, how many minutes do you engage in exercise at this level?: 30 min  Stress: Stress Concern Present (08/10/2023)   Received from [facility] of Occupational Health - Occupational Stress Questionnaire    Feeling of Stress : To some extent  Social Connections: Moderately Isolated (05/16/2024)   Social Connection and Isolation Panel    Frequency of Communication with Friends and Family: Twice a week    Frequency of Social Gatherings with Friends and Family: Three times a week    Attends Religious Services: More than 4 times per year    Active Member of Clubs or Organizations: No    Attends Banker Meetings: Never    Marital Status: Widowed    Current Medications:  Current Outpatient Medications:    ALPRAZolam  (XANAX ) 0.25 MG tablet, Take 0.125-0.25 mg by mouth daily as needed for anxiety., Disp: , Rfl:    amLODipine  (NORVASC ) 10 MG tablet, TAKE (1) TABLET BY MOUTH AT BEDTIME., Disp: 90 tablet, Rfl: 0   cholecalciferol (VITAMIN D) 1000 units tablet, Take 1,000 Units by mouth daily., Disp: , Rfl:    folic acid (FOLVITE) 400 MCG tablet, Take 400 mcg by mouth daily., Disp: , Rfl:    Multiple Vitamins-Minerals (MULTIVITAMIN WITH MINERALS) tablet, Take 1 tablet by mouth daily., Disp: , Rfl:    Potassium Citrate  15 MEQ (1620 MG) TBCR, Take 1 tablet by mouth  daily., Disp: , Rfl:    senna-docusate (SENOKOT-S) 8.6-50 MG tablet, Take 2 tablets by mouth at bedtime. For AFTER surgery, do not take if having diarrhea, Disp: 30 tablet, Rfl: 0   spironolactone (ALDACTONE) 25 MG tablet, Take 25 mg by mouth daily., Disp: , Rfl:    traMADol  (ULTRAM ) 50 MG tablet, Take 1 tablet (50 mg total) by mouth every 12 (twelve) hours as needed for severe pain (pain score 7-10). For AFTER surgery only, do not take and drive, Disp: 10 tablet, Rfl: 0   vitamin B-12 (CYANOCOBALAMIN) 1000 MCG tablet, Take 1,000 mcg by mouth daily., Disp: , Rfl:   Review of Symptoms: Pertinent positives as per HPI.  Physical Exam: Deferred given limitations of phone visit.  Laboratory & Radiologic Studies: A. RIGHT TUBE AND OVARY:  - Ovary with serous cystadenoma measuring 4.8 cm.  - Fallopian tube with fimbriated end; status post ligation.  - Negative for malignancy.   B. LEFT TUBE AND OVARY:  - Ovary with cortical inclusion cyst.  - Fallopian tube with fimbriated end; status post ligation.  - Negative for malignancy.   FINAL MICROSCOPIC DIAGNOSIS:  - No malignant cells identified   Assessment & Plan: Christine Newman is a 83 y.o. woman s/p robotic BSO for an adnexal mass and elevated CEA. Benign final pathology.  Doing very well. Reviewed restrictions and expectations. Very happy with pathology results.   I discussed the assessment and treatment plan with the patient. The patient was provided with an opportunity to ask questions and all were answered. The patient agreed with the plan and demonstrated an understanding of the instructions.   The patient was advised to call back or see an in-person evaluation if the symptoms worsen or if the condition fails to improve as anticipated.   6 minutes of total time was spent for this patient encounter, including preparation, phone counseling with the patient and coordination of care, and documentation of the encounter.   Comer Dollar, MD  Division of Gynecologic Oncology  Department of Obstetrics and Gynecology  [facility]  [facility]

## 2024-06-12 ENCOUNTER — Ambulatory Visit (INDEPENDENT_AMBULATORY_CARE_PROVIDER_SITE_OTHER): Admitting: Audiology

## 2024-06-21 ENCOUNTER — Inpatient Hospital Stay: Attending: Gynecologic Oncology | Admitting: Gynecologic Oncology

## 2024-06-21 ENCOUNTER — Encounter: Payer: Self-pay | Admitting: Gynecologic Oncology

## 2024-06-21 VITALS — BP 137/64 | HR 63 | Temp 98.1°F | Resp 18 | Wt 138.8 lb

## 2024-06-21 DIAGNOSIS — Z90722 Acquired absence of ovaries, bilateral: Secondary | ICD-10-CM

## 2024-06-21 DIAGNOSIS — N9489 Other specified conditions associated with female genital organs and menstrual cycle: Secondary | ICD-10-CM

## 2024-06-21 DIAGNOSIS — D270 Benign neoplasm of right ovary: Secondary | ICD-10-CM

## 2024-06-21 NOTE — Progress Notes (Signed)
 Gynecologic Oncology Return Clinic Visit  06/21/24  Reason for Visit: follow-up  Treatment History: In the setting of possible hematuria, the patient underwent CT imaging in March which showed 2 nonobstructing left renal calculi measuring up to 6 mm without hydronephrosis.  Severe left renal cortical scarring/atrophy noted.  There was incidental finding of a 4.4 cm simple right ovarian cyst.   The patient was seen initially in early May in the setting of an ovarian cyst that have been seen on recent CT scan (incidental finding).  The cyst was noted to be new since 2019. Patient was seen on 04/06/2024.  On pelvic ultrasound in clinic, uterus measured 4.4 x 4.1 x 2.3 cm with an endometrium of 2.1 mm.  Within the right adnexa, there was a 5.3 x 3.7 cm simple appearing cyst without blood flow seen.  Left ovary not visualized.  No free fluid.   Tumor markers were obtained on 6/27 and notable for: CEA 8.8 CA 125 10 CA 19-9 13 AFP 3.1   Repeat CEA 6.89   05/16/24: Robotic-assisted laparoscopic bilateral salpingo-oophorectomy   Interval History: Doing well.  Notes some discomfort at the left upper quadrant incision.  Otherwise denies any abdominal pain.  Reports normal bowel bladder function.  Past Medical/Surgical History: Past Medical History:  Diagnosis Date   Anal polyp 2005   condyloma acuminatum   Anemia    Arthritis    CKD (chronic kidney disease)    Cr 1.9 -09/2014   Colonic mass    Crohn disease of colon (HCC)    Diverticulitis    with abscess   History of kidney stones    Hypertension    Kidney stone     Past Surgical History:  Procedure Laterality Date   anal polypectomy  01/02/2004   Dr.Rosenbower- anal polyp removed. bx= condyloma acuminatum   CATARACT EXTRACTION W/PHACO Left 08/10/2018   Procedure: CATARACT EXTRACTION PHACO AND INTRAOCULAR LENS PLACEMENT LEFT EYE;  Surgeon: Perley Hamilton, MD;  Location: [facility];  Service: Ophthalmology;  Laterality: Left;  left    CATARACT EXTRACTION W/PHACO Right 09/04/2018   Procedure: CATARACT EXTRACTION PHACO AND INTRAOCULAR LENS PLACEMENT (IOC);  Surgeon: Perley Hamilton, MD;  Location: [facility];  Service: Ophthalmology;  Laterality: Right;  CDE: 11.16   COLONOSCOPY  04/08/2003   Dr.Mann- small nonbleeding internal and external hemorrhoids small polypoid mass at anal verge, normal appearing L colon, transverse colon, R colon including cecum. stenotic ileocecal valve. bx of maa= benign rectal type mucosa with features of mucosal prolapse syndrome   COLONOSCOPY N/A 04/02/2015   Dr.Rourk- colonic diverticulosis, no evidence of colonic neoplasm. abnormal ileocecal valve with ulceration bx= ulcerated/eroded benign ileocecal valve mucosa   COLONOSCOPY N/A 06/14/2018   scattered diverticula in sigmoid and descending colon. Distal 10 cm of neoterminal ileum appears normal. Appears to have surgical remission   EXTRACORPOREAL SHOCK WAVE LITHOTRIPSY Left 05/10/2023   Procedure: EXTRACORPOREAL SHOCK WAVE LITHOTRIPSY (ESWL);  Surgeon: Sherrilee Belvie CROME, MD;  Location: [facility];  Service: Urology;  Laterality: Left;   FLEXIBLE SIGMOIDOSCOPY  11/06/2003   Dr.Mann- small nodular mass at anal verge, bx done. early L side diverticula. bx= benign rectal mucosa with features of mucosal prolapse syndrome. no adenomatous changes or evidence of malignancy identified.    IR NEPHROSTOMY PLACEMENT LEFT  01/05/2018   KIDNEY STONE SURGERY     LAPAROSCOPY N/A 11/02/2017   Procedure: LAPAROSCOPIC ILEOCECECTOMY;  Surgeon: Debby Hila, MD;  [facility]: [facility];  Service: General;  Laterality: N/A;  NEPHROLITHOTOMY Left 02/13/2018   Procedure: NEPHROLITHOTOMY PERCUTANEOUS;  Surgeon: Sherrilee Belvie CROME, MD;  Location: [facility];  Service: Urology;  Laterality: Left;   ROBOTIC ASSISTED BILATERAL SALPINGO OOPHERECTOMY Bilateral 05/16/2024   Procedure: SALPINGO-OOPHORECTOMY, BILATERAL, ROBOT-ASSISTED;  Surgeon: Viktoria Comer SAUNDERS, MD;  Location: [facility];  Service:  Gynecology;  Laterality: Bilateral;    Family History  Problem Relation Age of Onset   Hypertension Mother    Heart disease Father    Breast cancer Sister    Colon cancer Neg Hx    Colon polyps Neg Hx    Pancreatic cancer Neg Hx    Prostate cancer Neg Hx    Endometrial cancer Neg Hx    Ovarian cancer Neg Hx     Social History   Socioeconomic History   Marital status: Widowed    Spouse name: Not on file   Number of children: Not on file   Years of education: Not on file   Highest education level: Not on file  Occupational History   Not on file  Tobacco Use   Smoking status: Former    Current packs/day: 0.00    Average packs/day: 0.3 packs/day for 5.0 years (1.3 ttl pk-yrs)    Types: Cigarettes    Start date: 01/18/1957    Quit date: 01/18/1962    Years since quitting: 62.4   Smokeless tobacco: Never   Tobacco comments:    1 pack cigarettes per week ; quit in her early 39s   Vaping Use   Vaping status: Never Used  Substance and Sexual Activity   Alcohol use: No   Drug use: Never   Sexual activity: Not Currently    Birth control/protection: Post-menopausal  Other Topics Concern   Not on file  Social History Narrative   Not on file   Social Drivers of Health   Financial Resource Strain: Low Risk  (11/01/2023)   Received from [facility]   Overall Financial Resource Strain (CARDIA)    Difficulty of Paying Living Expenses: Not hard at all  Food Insecurity: No Food Insecurity (05/16/2024)   Hunger Vital Sign    Worried About Running Out of Food in the Last Year: Never true    Ran Out of Food in the Last Year: Never true  Transportation Needs: No Transportation Needs (05/16/2024)   PRAPARE - Administrator, Civil Service (Medical): No    Lack of Transportation (Non-Medical): No  Physical Activity: Insufficiently Active (08/10/2023)   Received from [facility]   Exercise Vital Sign    On average, how many days per week do you engage in moderate to  strenuous exercise (like a brisk walk)?: 3 days    On average, how many minutes do you engage in exercise at this level?: 30 min  Stress: Stress Concern Present (08/10/2023)   Received from [facility] of Occupational Health - Occupational Stress Questionnaire    Feeling of Stress : To some extent  Social Connections: Moderately Isolated (05/16/2024)   Social Connection and Isolation Panel    Frequency of Communication with Friends and Family: Twice a week    Frequency of Social Gatherings with Friends and Family: Three times a week    Attends Religious Services: More than 4 times per year    Active Member of Clubs or Organizations: No    Attends Banker Meetings: Never    Marital Status: Widowed    Current Medications:  Current Outpatient Medications:  ALPRAZolam  (XANAX ) 0.25 MG tablet, Take 0.125-0.25 mg by mouth daily as needed for anxiety., Disp: , Rfl:    amLODipine  (NORVASC ) 10 MG tablet, TAKE (1) TABLET BY MOUTH AT BEDTIME., Disp: 90 tablet, Rfl: 0   cholecalciferol (VITAMIN D) 1000 units tablet, Take 1,000 Units by mouth daily., Disp: , Rfl:    folic acid (FOLVITE) 400 MCG tablet, Take 400 mcg by mouth daily., Disp: , Rfl:    Multiple Vitamins-Minerals (MULTIVITAMIN WITH MINERALS) tablet, Take 1 tablet by mouth daily., Disp: , Rfl:    Potassium Citrate  15 MEQ (1620 MG) TBCR, Take 1 tablet by mouth daily., Disp: , Rfl:    senna-docusate (SENOKOT-S) 8.6-50 MG tablet, Take 2 tablets by mouth at bedtime. For AFTER surgery, do not take if having diarrhea, Disp: 30 tablet, Rfl: 0   spironolactone (ALDACTONE) 25 MG tablet, Take 25 mg by mouth daily., Disp: , Rfl:    traMADol  (ULTRAM ) 50 MG tablet, Take 1 tablet (50 mg total) by mouth every 12 (twelve) hours as needed for severe pain (pain score 7-10). For AFTER surgery only, do not take and drive, Disp: 10 tablet, Rfl: 0   vitamin B-12 (CYANOCOBALAMIN) 1000 MCG tablet, Take 1,000 mcg by mouth daily.,  Disp: , Rfl:   Review of Systems: Denies appetite changes, fevers, chills, fatigue, unexplained weight changes. Denies hearing loss, neck lumps or masses, mouth sores, ringing in ears or voice changes. Denies cough or wheezing.  Denies shortness of breath. Denies chest pain or palpitations. Denies leg swelling. Denies abdominal distention, pain, blood in stools, constipation, diarrhea, nausea, vomiting, or early satiety. Denies pain with intercourse, dysuria, frequency, hematuria or incontinence. Denies hot flashes, pelvic pain, vaginal bleeding or vaginal discharge.   Denies joint pain, back pain or muscle pain/cramps. Denies itching, rash, or wounds. Denies dizziness, headaches, numbness or seizures. Denies swollen lymph nodes or glands, denies easy bruising or bleeding. Denies anxiety, depression, confusion, or decreased concentration.  Physical Exam: BP 137/64 (BP Location: Left Arm, Patient Position: Sitting)   Pulse 63   Temp 98.1 F (36.7 C)   Resp 18   Wt 138 lb 12.8 oz (63 kg)   LMP  (LMP Unknown)   SpO2 98%   BMI 23.10 kg/m  General: Alert, oriented, no acute distress. HEENT: Posterior oropharynx clear, sclera anicteric. Chest: Unlabored breathing on room air. Abdomen: soft, nontender.  Normoactive bowel sounds.  No masses or hepatosplenomegaly appreciated.  Well-healed incisions. Extremities: Grossly normal range of motion.  Warm, well perfused.  No edema bilaterally.  Laboratory & Radiologic Studies: A. RIGHT TUBE AND OVARY:  - Ovary with serous cystadenoma measuring 4.8 cm.  - Fallopian tube with fimbriated end; status post ligation.  - Negative for malignancy.   B. LEFT TUBE AND OVARY:  - Ovary with cortical inclusion cyst.  - Fallopian tube with fimbriated end; status post ligation.  - Negative for malignancy.    FINAL MICROSCOPIC DIAGNOSIS:  - No malignant cells identified   Assessment & Plan: Christine Newman is a 83 y.o. woman s/p robotic BSO for an  adnexal mass and elevated CEA. Benign final pathology.   Patient doing very well after surgery, meeting milestones.  Discussed continued expectations and restrictions.  She will be discharged back to her other healthcare providers.  12 minutes of total time was spent for this patient encounter, including preparation, face-to-face counseling with the patient and coordination of care, and documentation of the encounter.  Comer Dollar, MD  Division of Gynecologic Oncology  Department of Obstetrics and Gynecology  [facility] of [place]  [facility]

## 2024-07-05 ENCOUNTER — Ambulatory Visit (INDEPENDENT_AMBULATORY_CARE_PROVIDER_SITE_OTHER): Admitting: Audiology

## 2024-07-05 DIAGNOSIS — H903 Sensorineural hearing loss, bilateral: Secondary | ICD-10-CM

## 2024-07-09 NOTE — Progress Notes (Signed)
  [address], [address] [place], [place] [address] [phone]  Hearing Aid Check     Christine Newman  walked in to have her aid cleaned and get some supplies.     Right Left  Hearing aid manufacturer Phonak Audeo V70 Phonak Audeo V70  Hearing aid style Receiver in the canal Receiver in the canal  Hearing aid battery 10 10  Receiver 2xS 2xS  Dome/ custom earpiece    Retention wire    Warranty expiration date EXPIRED EXPIRED  Loss and Damage    Additional accessories Expiration date    Initial fitting date unknown unknown  Device was fit at: [facility]    Actions taken: Both devices were cleaned and provided the patient with supplies.   Services fee: $0 was paid at checkout. Patient is aware that future hearing  aid check and assistance with supplies will incur in a fee.   Recommend: Return for a hearing aid check , as needed. Return for a hearing evaluation and to see an ENT, if concerns with hearing changes arise.    Chinmay Squier Christine Newman, AUD

## 2024-07-27 ENCOUNTER — Ambulatory Visit (HOSPITAL_COMMUNITY)
Admission: RE | Admit: 2024-07-27 | Discharge: 2024-07-27 | Disposition: A | Source: Ambulatory Visit | Attending: Urology

## 2024-07-27 DIAGNOSIS — N200 Calculus of kidney: Secondary | ICD-10-CM | POA: Diagnosis present

## 2024-08-01 ENCOUNTER — Ambulatory Visit: Admitting: Urology

## 2024-08-01 ENCOUNTER — Encounter: Payer: Self-pay | Admitting: Urology

## 2024-08-01 VITALS — BP 148/75 | HR 66

## 2024-08-01 DIAGNOSIS — N200 Calculus of kidney: Secondary | ICD-10-CM | POA: Diagnosis not present

## 2024-08-01 DIAGNOSIS — N3000 Acute cystitis without hematuria: Secondary | ICD-10-CM

## 2024-08-01 LAB — MICROSCOPIC EXAMINATION: WBC, UA: >30 /HPF — AB (ref 0–5)

## 2024-08-01 LAB — URINALYSIS, ROUTINE W REFLEX MICROSCOPIC
Bilirubin, UA: NEGATIVE
Glucose, UA: NEGATIVE
Ketones, UA: NEGATIVE
Nitrite, UA: POSITIVE — AB
RBC, UA: NEGATIVE
Specific Gravity, UA: 1.02 (ref 1.005–1.030)
Urobilinogen, Ur: 0.2 mg/dL (ref 0.2–1.0)
pH, UA: 6 (ref 5.0–7.5)

## 2024-08-01 MED ORDER — NITROFURANTOIN MONOHYD MACRO 100 MG PO CAPS: 100.0000 mg | ORAL_CAPSULE | Freq: Two times a day (BID) | ORAL | 0 refills | Status: DC

## 2024-08-01 NOTE — Patient Instructions (Signed)

## 2024-08-01 NOTE — Progress Notes (Signed)
 08/01/2024 8:47 AM   Christine Newman 11-Oct-1941 [id number]  Referring provider: Suanne Pfeiffer, NP [address] [place],  [place] [address]  No chief complaint on file.   HPI: [name] is a 83yo here for followup for nephrolithiasis. UA concerning for infection. For the past several days she has noted worsening lower back pain. She has increased urinary frequency and urgency. No hematuria or dysuria. No stone events since last visit. KUb from yesterday shows a 6mm left mid pole renal calculus   PMH: Past Medical History:  Diagnosis Date   Anal polyp 2005   condyloma acuminatum   Anemia    Arthritis    CKD (chronic kidney disease)    Cr 1.9 -09/2014   Colonic mass    Crohn disease of colon (HCC)    Diverticulitis    with abscess   History of kidney stones    Hypertension    Kidney stone     Surgical History: Past Surgical History:  Procedure Laterality Date   anal polypectomy  01/02/2004   Dr.Rosenbower- anal polyp removed. bx= condyloma acuminatum   CATARACT EXTRACTION W/PHACO Left 08/10/2018   Procedure: CATARACT EXTRACTION PHACO AND INTRAOCULAR LENS PLACEMENT LEFT EYE;  Surgeon: Christine Hamilton, MD;  Location: [facility];  Service: Ophthalmology;  Laterality: Left;  left   CATARACT EXTRACTION W/PHACO Right 09/04/2018   Procedure: CATARACT EXTRACTION PHACO AND INTRAOCULAR LENS PLACEMENT (IOC);  Surgeon: Christine Hamilton, MD;  Location: [facility];  Service: Ophthalmology;  Laterality: Right;  CDE: 11.16   COLONOSCOPY  04/08/2003   Dr.Mann- small nonbleeding internal and external hemorrhoids small polypoid mass at anal verge, normal appearing L colon, transverse colon, R colon including cecum. stenotic ileocecal valve. bx of maa= benign rectal type mucosa with features of mucosal prolapse syndrome   COLONOSCOPY N/A 04/02/2015   Dr.Rourk- colonic diverticulosis, no evidence of colonic neoplasm. abnormal ileocecal valve with ulceration bx= ulcerated/eroded benign  ileocecal valve mucosa   COLONOSCOPY N/A 06/14/2018   scattered diverticula in sigmoid and descending colon. Distal 10 cm of neoterminal ileum appears normal. Appears to have surgical remission   EXTRACORPOREAL SHOCK WAVE LITHOTRIPSY Left 05/10/2023   Procedure: EXTRACORPOREAL SHOCK WAVE LITHOTRIPSY (ESWL);  Surgeon: Sherrilee Belvie CROME, MD;  Location: [facility];  Service: Urology;  Laterality: Left;   FLEXIBLE SIGMOIDOSCOPY  11/06/2003   Dr.Mann- small nodular mass at anal verge, bx done. early L side diverticula. bx= benign rectal mucosa with features of mucosal prolapse syndrome. no adenomatous changes or evidence of malignancy identified.    IR NEPHROSTOMY PLACEMENT LEFT  01/05/2018   KIDNEY STONE SURGERY     LAPAROSCOPY N/A 11/02/2017   Procedure: LAPAROSCOPIC ILEOCECECTOMY;  Surgeon: Christine Hila, MD;  Location: [facility];  Service: General;  Laterality: N/A;   NEPHROLITHOTOMY Left 02/13/2018   Procedure: NEPHROLITHOTOMY PERCUTANEOUS;  Surgeon: Sherrilee Belvie CROME, MD;  Location: [facility];  Service: Urology;  Laterality: Left;   ROBOTIC ASSISTED BILATERAL SALPINGO OOPHERECTOMY Bilateral 05/16/2024   Procedure: SALPINGO-OOPHORECTOMY, BILATERAL, ROBOT-ASSISTED;  Surgeon: Christine Comer SAUNDERS, MD;  Location: [facility];  Service: Gynecology;  Laterality: Bilateral;    Home Medications:  Allergies as of 08/01/2024       Reactions   Metoprolol     Bradycardia        Medication List        Accurate as of August 01, 2024  8:47 AM. If you have any questions, ask your nurse or doctor.  ALPRAZolam  0.25 MG tablet Commonly known as: XANAX  Take 0.125-0.25 mg by mouth daily as needed for anxiety.   amLODipine  10 MG tablet Commonly known as: NORVASC  TAKE (1) TABLET BY MOUTH AT BEDTIME.   cholecalciferol 1000 units tablet Commonly known as: VITAMIN D Take 1,000 Units by mouth daily.   cyanocobalamin 1000 MCG tablet Commonly known as: VITAMIN B12 Take 1,000 mcg by mouth daily.   folic  acid 400 MCG tablet Commonly known as: FOLVITE Take 400 mcg by mouth daily.   multivitamin with minerals tablet Take 1 tablet by mouth daily.   Potassium Citrate  15 MEQ (1620 MG) Tbcr Take 1 tablet by mouth daily.   senna-docusate 8.6-50 MG tablet Commonly known as: Senokot-S Take 2 tablets by mouth at bedtime. For AFTER surgery, do not take if having diarrhea   spironolactone 25 MG tablet Commonly known as: ALDACTONE Take 25 mg by mouth daily.   traMADol  50 MG tablet Commonly known as: ULTRAM  Take 1 tablet (50 mg total) by mouth every 12 (twelve) hours as needed for severe pain (pain score 7-10). For AFTER surgery only, do not take and drive        Allergies:  Allergies  Allergen Reactions   Metoprolol      Bradycardia    Family History: Family History  Problem Relation Age of Onset   Hypertension Mother    Heart disease Father    Breast cancer Sister    Colon cancer Neg Hx    Colon polyps Neg Hx    Pancreatic cancer Neg Hx    Prostate cancer Neg Hx    Endometrial cancer Neg Hx    Ovarian cancer Neg Hx     Social History:  reports that she quit smoking about 62 years ago. Her smoking use included cigarettes. She started smoking about 67 years ago. She has a 1.3 pack-year smoking history. She has never used smokeless tobacco. She reports that she does not drink alcohol and does not use drugs.  ROS: All other review of systems were reviewed and are negative except what is noted above in HPI  Physical Exam: BP (!) 148/75   Pulse 66   LMP  (LMP Unknown)   Constitutional:  Alert and oriented, No acute distress. HEENT: [place] AT, moist mucus membranes.  Trachea midline, no masses. Cardiovascular: No clubbing, cyanosis, or edema. Respiratory: Normal respiratory effort, no increased work of breathing. GI: Abdomen is soft, nontender, nondistended, no abdominal masses GU: No CVA tenderness.  Lymph: No cervical or inguinal lymphadenopathy. Skin: No rashes, bruises or  suspicious lesions. Neurologic: Grossly intact, no focal deficits, moving all 4 extremities. Psychiatric: Normal mood and affect.  Laboratory Data: Lab Results  Component Value Date   WBC 5.5 05/17/2024   HGB 11.1 (L) 05/17/2024   HCT 36.9 05/17/2024   MCV 97.4 05/17/2024   PLT 196 05/17/2024    Lab Results  Component Value Date   CREATININE 1.50 (H) 05/17/2024    No results found for: PSA  No results found for: TESTOSTERONE  Lab Results  Component Value Date   HGBA1C 5.4 07/31/2021    Urinalysis    Component Value Date/Time   COLORURINE YELLOW 01/05/2018 1404   APPEARANCEUR Clear 01/30/2024 1130   LABSPEC 1.018 01/05/2018 1404   PHURINE 5.0 01/05/2018 1404   GLUCOSEU Negative 01/30/2024 1130   HGBUR MODERATE (A) 01/05/2018 1404   BILIRUBINUR Negative 01/30/2024 1130   KETONESUR NEGATIVE 01/05/2018 1404   PROTEINUR 1+ (A) 01/30/2024 1130   PROTEINUR  NEGATIVE 01/05/2018 1404   UROBILINOGEN 0.2 02/11/2020 1530   UROBILINOGEN 0.2 01/13/2015 1908   NITRITE Negative 01/30/2024 1130   NITRITE NEGATIVE 01/05/2018 1404   LEUKOCYTESUR 2+ (A) 01/30/2024 1130    Lab Results  Component Value Date   LABMICR See below: 01/30/2024   WBCUA >30 (A) 01/30/2024   LABEPIT 0-10 01/30/2024   MUCUS Present (A) 12/16/2023   BACTERIA Many (A) 01/30/2024    Pertinent Imaging: KUb yesterday: images reviewed and discussed with the patient  Results for orders placed in visit on 12/15/23  Abdomen 1 view (KUB)  Narrative EXAM: 1 VIEW XRAY OF THE ABDOMEN 07/27/2024 08:25:00 AM  COMPARISON: 12/15/2023  CLINICAL HISTORY: nephrolithiasis. Pt states hx kidney stones w/ previous interventions of lithotripsy and cystoscopy  FINDINGS:  BOWEL: Nonobstructive bowel gas pattern. Bowel staple line in right lower quadrant. Mild to moderate stool burden in the transverse colon.  SOFT TISSUES: Similar 7 mm calculi project over left renal shadow. Stable pelvic  phleboliths.  BONES: No acute osseous abnormality. Degenerative changes of lumbar spine.  IMPRESSION: 1. Similar 7 mm calculi over the left renal shadow consistent with nephrolithiasis. 2. Mild to moderate stool burden in the transverse colon.  Electronically signed by: Donnice Mania MD 07/31/2024 06:24 PM EDT RP Workstation: [id number]  No results found for this or any previous visit.  No results found for this or any previous visit.  No results found for this or any previous visit.  Results for orders placed during the hospital encounter of 07/03/18  US  RENAL  Narrative CLINICAL DATA:  83 year old female with right kidney stone. Six-month follow-up.  EXAM: RENAL / URINARY TRACT ULTRASOUND COMPLETE  COMPARISON:  04/19/2018 renal sonogram. 04/05/2018 CT. Plain film of the abdomen same date dictated separately.  FINDINGS: Right Kidney:  Length: 9.8 cm. Slight increased renal parenchyma echogenicity. No hydronephrosis. 6 mm nonobstructing stone mid aspect. No right renal mass identified.  Left Kidney:  Length: 7.8 cm. Atrophic and difficult to adequately assess. No obvious mass or hydronephrosis.  Bladder:  Not evaluated as empty.  IMPRESSION: 1. 6 mm nonobstructing right renal calculi. No right-sided hydronephrosis. Mild increased renal parenchyma echogenicity of the right kidney may represent changes of mild medical renal disease. 2. Atrophic left kidney difficult to visualize. No obvious left-sided hydronephrosis or mass.   Electronically Signed By: Elspeth Slain M.D. On: 07/03/2018 16:35  No results found for this or any previous visit.  No results found for this or any previous visit.  Results for orders placed during the hospital encounter of 02/13/18  CT RENAL STONE STUDY  Narrative CLINICAL DATA:  Left-sided pain. Reported left percutaneous nephrolithotomy 1 day prior.  EXAM: CT ABDOMEN AND PELVIS WITHOUT  CONTRAST  TECHNIQUE: Multidetector CT imaging of the abdomen and pelvis was performed following the standard protocol without IV contrast.  COMPARISON:  02/08/2018 CT abdomen/pelvis.  FINDINGS: Lower chest: Subsegmental dependent bibasilar atelectasis. Subcentimeter calcified medial left lower lobe granuloma. Coronary atherosclerosis.  Hepatobiliary: Normal liver size. Simple 1.0 cm posterior right liver lobe cyst. Subcentimeter hypodense liver lesions near the gallbladder fossa (series 2/image 21), too small to characterize, stable, probably benign. No new liver lesions. Mildly distended gallbladder. No radiopaque cholelithiasis. No gallbladder wall thickening. No pericholecystic fluid. No biliary ductal dilatation.  Pancreas: Normal, with no mass or duct dilation.  Spleen: Normal size. No mass.  Adrenals/Urinary Tract: Normal adrenals. Asymmetric severe left renal parenchymal atrophy. Large bore left percutaneous nephrostomy tube terminates in the left renal pelvis. Well-positioned  left nephroureteral stent with proximal pigtail portion in the left renal pelvis and distal pigtail portion in the bladder. Expected gas scattered in the left renal collecting system. A few mildly dilated calices in the left renal collecting system. Left renal pelvis collapse. Nonspecific haziness of the central left renal sinus fat, presumably inflammatory or due to postprocedure change. No residual stone fragments in the left renal collecting system or left ureter. Mild ill-defined fluid and haziness in the left perinephric retroperitoneum. No contour deforming left renal mass.  Nonobstructing 4 mm interpolar and 2 mm lower right renal stones. Small simple parapelvic right renal cysts. No right hydronephrosis. Normal caliber right ureter, with no right ureteral stones. No contour deforming right renal mass.  Collapsed bladder. No bladder stones. Expected gas in the nondependent bladder from  instrumentation.  Stomach/Bowel: Normal non-distended stomach. Stable postsurgical changes from ileocecal resection with intact appearing neo ileocolic anastomosis in the right abdomen. No small bowel wall thickening. A few mildly dilated small bowel loops in the right abdomen up to the 4.3 cm diameter without focal small bowel caliber transition. Remnant large bowel is normal caliber with no wall thickening or significant pericolonic fat stranding.  Vascular/Lymphatic: Atherosclerotic nonaneurysmal abdominal aorta. No pathologically enlarged lymph nodes in the abdomen or pelvis.  Reproductive: Grossly normal uterus.  No adnexal mass.  Other: No pneumoperitoneum, ascites or focal fluid collection.  Musculoskeletal: No aggressive appearing focal osseous lesions. Moderate lumbar spondylosis.  IMPRESSION: 1. Expected postprocedural change status post left percutaneous nephrolithotomy. No residual stone fragments in the left renal collecting system or left ureter. Well-positioned left percutaneous nephrostomy tube and left nephroureteral stent. A few dilated calices in the left renal collecting system with nondilated left renal pelvis. No focal fluid collections. 2. Nonobstructing right nephrolithiasis. 3. Mildly dilated right abdominal small bowel loops, favor mild post procedural ileus. 4.  Aortic Atherosclerosis (ICD10-I70.0).   Electronically Signed By: Selinda DELENA Blue M.D. On: 02/14/2018 13:16   Assessment & Plan:    1. Kidney stones (Primary) -We discussed the management of kidney stones. These options include observation, ureteroscopy, shockwave lithotripsy (ESWL) and percutaneous nephrolithotomy (PCNL). We discussed which options are relevant to the patient's stone(s). We discussed the natural history of kidney stones as well as the complications of untreated stones and the impact on quality of life without treatment as well as with each of the above listed treatments. We  also discussed the efficacy of each treatment in its ability to clear the stone burden. With any of these management options I discussed the signs and symptoms of infection and the need for emergent treatment should these be experienced. For each option we discussed the ability of each procedure to clear the patient of their stone burden.   For observation I described the risks which include but are not limited to silent renal damage, life-threatening infection, need for emergent surgery, failure to pass stone and pain.   For ureteroscopy I described the risks which include bleeding, infection, damage to contiguous structures, positioning injury, ureteral stricture, ureteral avulsion, ureteral injury, need for prolonged ureteral stent, inability to perform ureteroscopy, need for an interval procedure, inability to clear stone burden, stent discomfort/pain, heart attack, stroke, pulmonary embolus and the inherent risks with general anesthesia.   For shockwave lithotripsy I described the risks which include arrhythmia, kidney contusion, kidney hemorrhage, need for transfusion, pain, inability to adequately break up stone, inability to pass stone fragments, Steinstrasse, infection associated with obstructing stones, need for alternate surgical procedure, need for  repeat shockwave lithotripsy, MI, CVA, PE and the inherent risks with anesthesia/conscious sedation.   For PCNL I described the risks including positioning injury, pneumothorax, hydrothorax, need for chest tube, inability to clear stone burden, renal laceration, arterial venous fistula or malformation, need for embolization of kidney, loss of kidney or renal function, need for repeat procedure, need for prolonged nephrostomy tube, ureteral avulsion, MI, CVA, PE and the inherent risks of general anesthesia.   - The patient would like to proceed with observation - Urinalysis, Routine w reflex microscopic  2. Acute cystitis -urine for  culture -macrobid 100mg  BID for 7 days   No follow-ups on file.  Belvie Clara, MD  [facility] [place]

## 2024-08-05 LAB — URINE CULTURE

## 2024-08-06 ENCOUNTER — Ambulatory Visit: Payer: Self-pay

## 2024-08-06 MED ORDER — SULFAMETHOXAZOLE-TRIMETHOPRIM 800-160 MG PO TABS: 1.0000 | ORAL_TABLET | Freq: Two times a day (BID) | ORAL | 0 refills | Status: AC

## 2024-08-06 NOTE — Telephone Encounter (Signed)
 Patient called and made aware of positive urine culture and Bactrim Ds sent to pharmacy per Dr. Sherrilee.  Patient also made aware to stop Macrobid.Christine Newman

## 2024-08-16 ENCOUNTER — Other Ambulatory Visit (HOSPITAL_COMMUNITY): Payer: Self-pay | Admitting: Adult Health Nurse Practitioner

## 2024-08-16 DIAGNOSIS — M8589 Other specified disorders of bone density and structure, multiple sites: Secondary | ICD-10-CM

## 2024-08-21 ENCOUNTER — Ambulatory Visit (HOSPITAL_COMMUNITY)
Admission: RE | Admit: 2024-08-21 | Discharge: 2024-08-21 | Disposition: A | Discharge status: Home / Self Care | Source: Ambulatory Visit | Attending: Adult Health Nurse Practitioner | Admitting: Adult Health Nurse Practitioner

## 2024-08-21 DIAGNOSIS — M8589 Other specified disorders of bone density and structure, multiple sites: Secondary | ICD-10-CM | POA: Diagnosis present

## 2024-08-24 ENCOUNTER — Ambulatory Visit (INDEPENDENT_AMBULATORY_CARE_PROVIDER_SITE_OTHER): Payer: Self-pay | Admitting: Audiology

## 2024-08-24 DIAGNOSIS — H903 Sensorineural hearing loss, bilateral: Secondary | ICD-10-CM

## 2024-08-24 NOTE — Progress Notes (Signed)
  [address], [address] [place], [place] [address] [phone]  Hearing Aid Check     RHEANNE CORTOPASSI walked in to have her right aid checked.       Right Left  Hearing aid manufacturer Phonak Audeo V70 Phonak Audeo V70  Hearing aid style Receiver in the canal Receiver in the canal  Hearing aid battery 10 10  Receiver 2xS 2xS  Dome/ custom earpiece      Retention wire      Warranty expiration date EXPIRED EXPIRED  Loss and Damage      Additional accessories Expiration date      Initial fitting date unknown unknown  Device was fit at: [facility]    Chief complaint: Patient reports aid is not working even after replacing the battery.  Actions taken: Inspection of the device and listening check showed that it worked well after replacing the wax filter.  Services fee: $30 was paid at checkout. Patient waited for the repair and the aids was returned to her.    Recommend: Return for a hearing aid check, as needed. Return for a hearing evaluation and to see an ENT, if concerns with hearing changes arise.    Jeshawn Melucci MARIE LEROUX-MARTINEZ, AUD

## 2024-09-17 ENCOUNTER — Other Ambulatory Visit: Payer: Self-pay

## 2024-09-17 ENCOUNTER — Ambulatory Visit (HOSPITAL_COMMUNITY)

## 2024-09-17 ENCOUNTER — Encounter (HOSPITAL_COMMUNITY): Payer: Self-pay

## 2024-09-17 DIAGNOSIS — M5459 Other low back pain: Secondary | ICD-10-CM | POA: Insufficient documentation

## 2024-09-17 DIAGNOSIS — Z7409 Other reduced mobility: Secondary | ICD-10-CM | POA: Insufficient documentation

## 2024-09-17 DIAGNOSIS — M25552 Pain in left hip: Secondary | ICD-10-CM | POA: Insufficient documentation

## 2024-09-17 NOTE — Therapy (Signed)
 OUTPATIENT PHYSICAL THERAPY THORACOLUMBAR EVALUATION   Patient Name: Christine Newman MRN: [id number] DOB:April 19, 1941, 83 y.o., female Today's Date: 09/17/2024  END OF SESSION:  PT End of Session - 09/17/24 0928     Visit Number 1    Date for Recertification  10/15/24    Authorization Type [employer] MEDICARE    Authorization Time Period seeking auth, 6 approved    Authorization - Visit Number 1    Authorization - Number of Visits 6    Progress Note Due on Visit 3    PT Start Time 0815    PT Stop Time 0859    PT Time Calculation (min) 44 min    Activity Tolerance Patient tolerated treatment well;Patient limited by pain    Behavior During Therapy [facility] for tasks assessed/performed          Past Medical History:  Diagnosis Date   Anal polyp 2005   condyloma acuminatum   Anemia    Arthritis    CKD (chronic kidney disease)    Cr 1.9 -09/2014   Colonic mass    Crohn disease of colon (HCC)    Diverticulitis    with abscess   History of kidney stones    Hypertension    Kidney stone    Past Surgical History:  Procedure Laterality Date   anal polypectomy  01/02/2004   Dr.Rosenbower- anal polyp removed. bx= condyloma acuminatum   CATARACT EXTRACTION W/PHACO Left 08/10/2018   Procedure: CATARACT EXTRACTION PHACO AND INTRAOCULAR LENS PLACEMENT LEFT EYE;  Surgeon: Perley Hamilton, MD;  Location: [facility];  Service: Ophthalmology;  Laterality: Left;  left   CATARACT EXTRACTION W/PHACO Right 09/04/2018   Procedure: CATARACT EXTRACTION PHACO AND INTRAOCULAR LENS PLACEMENT (IOC);  Surgeon: Perley Hamilton, MD;  Location: [facility];  Service: Ophthalmology;  Laterality: Right;  CDE: 11.16   COLONOSCOPY  04/08/2003   Dr.Mann- small nonbleeding internal and external hemorrhoids small polypoid mass at anal verge, normal appearing L colon, transverse colon, R colon including cecum. stenotic ileocecal valve. bx of maa= benign rectal type mucosa with features of mucosal prolapse syndrome   COLONOSCOPY N/A  04/02/2015   Dr.Rourk- colonic diverticulosis, no evidence of colonic neoplasm. abnormal ileocecal valve with ulceration bx= ulcerated/eroded benign ileocecal valve mucosa   COLONOSCOPY N/A 06/14/2018   scattered diverticula in sigmoid and descending colon. Distal 10 cm of neoterminal ileum appears normal. Appears to have surgical remission   EXTRACORPOREAL SHOCK WAVE LITHOTRIPSY Left 05/10/2023   Procedure: EXTRACORPOREAL SHOCK WAVE LITHOTRIPSY (ESWL);  Surgeon: Sherrilee Belvie CROME, MD;  Location: [facility];  Service: Urology;  Laterality: Left;   FLEXIBLE SIGMOIDOSCOPY  11/06/2003   Dr.Mann- small nodular mass at anal verge, bx done. early L side diverticula. bx= benign rectal mucosa with features of mucosal prolapse syndrome. no adenomatous changes or evidence of malignancy identified.    IR NEPHROSTOMY PLACEMENT LEFT  01/05/2018   KIDNEY STONE SURGERY     LAPAROSCOPY N/A 11/02/2017   Procedure: LAPAROSCOPIC ILEOCECECTOMY;  Surgeon: Debby Hila, MD;  Location: [facility];  Service: General;  Laterality: N/A;   NEPHROLITHOTOMY Left 02/13/2018   Procedure: NEPHROLITHOTOMY PERCUTANEOUS;  Surgeon: Sherrilee Belvie CROME, MD;  Location: [facility];  Service: Urology;  Laterality: Left;   ROBOTIC ASSISTED BILATERAL SALPINGO OOPHERECTOMY Bilateral 05/16/2024   Procedure: SALPINGO-OOPHORECTOMY, BILATERAL, ROBOT-ASSISTED;  Surgeon: Viktoria Comer SAUNDERS, MD;  Location: [facility];  Service: Gynecology;  Laterality: Bilateral;   Patient Active Problem List   Diagnosis Date Noted   Adnexal mass 05/16/2024  Elevated carcinoembryonic antigen (CEA) 05/16/2024   Dizziness 05/16/2024   Former smoker 12/16/2023   Vaginal spotting 12/16/2023   Hematuria 12/16/2023   Kidney stones 11/17/2023   Sensorineural hearing loss, bilateral 08/04/2023   Osteoarthritis of midfoot 02/15/2023   History of kidney stones 07/31/2021   History of diverticulitis 07/31/2021   Osteoarthritis 07/31/2021   Pure hypercholesterolemia 07/31/2021    Osteopenia 05/11/2021   Crohn's disease in remission (HCC) 09/15/2018   Generalized anxiety disorder 09/27/2015   CKD (chronic kidney disease) stage 3, GFR 30-59 ml/min (HCC) 01/14/2015   Essential hypertension, benign 03/11/2013    PCP: Suanne Pfeiffer, NP   REFERRING PROVIDER: Edna Toribio LABOR, MD  REFERRING DIAG: M54.50 (ICD-10-CM) - Low back pain, unspecified  Rationale for Evaluation and Treatment: Rehabilitation  THERAPY DIAG:  Other low back pain  Pain in left hip  Impaired functional mobility, balance, and endurance  ONSET DATE: couple months ago  SUBJECTIVE:                                                                                                                                                                                           SUBJECTIVE STATEMENT: Pt states the pain worst in the morning first thing, pt states it only hurts on the left side. Pt states she has had no falls. Pt is currently involved with exercise group on Fridays. Pt states she feels like she is doing fine and does not want to take up therapy visits when others need it more than her.   PERTINENT HISTORY:  Ovary removal in October, pain sort of started after that Arthritis in left foot PAIN:  Are you having pain? Yes: NPRS scale: 6-7/10 first thing in the morning, now 0/10 Pain location: left posterior hip Pain description: sharp grabbing pain Aggravating factors: first thing in the morning Relieving factors: moving around in the morning  PRECAUTIONS: None  RED FLAGS: None   WEIGHT BEARING RESTRICTIONS: No  FALLS:  Has patient fallen in last 6 months? No  LIVING ENVIRONMENT: Lives with: lives with their family Lives in: House/apartment Stairs: Yes: External: 2 steps; none Has following equipment at home: None  OCCUPATION: retired  PLOF: Independent and Independent with basic ADLs  PATIENT GOALS: decrease the pain in the morning  NEXT MD VISIT: none at this  time  OBJECTIVE:  Note: Objective measures were completed at Evaluation unless otherwise noted.  DIAGNOSTIC FINDINGS:  None applicable  PATIENT SURVEYS:       SENSATION: Light touch: WFL, numbness and tingling when pain is in leg   POSTURE: rounded shoulders, forward head, and flexed trunk   PALPATION: Slight  tenderness to sciatic on left side only  LUMBAR ROM:   AROM eval  Flexion WFL  Extension WFL  Right lateral flexion 25  Left lateral flexion 20, slight feeling of pain  Right rotation WFL  Left rotation WFL   (Blank rows = not tested)  LOWER EXTREMITY ROM:     Active  Right eval Left eval  Hip flexion [facility]  Hip extension [facility]  Hip abduction [facility] WFL  Hip adduction [facility] WFL  Hip internal rotation [facility] WFL  Hip external rotation WFL WFL, slight pain noted  Knee flexion    Knee extension    Ankle dorsiflexion    Ankle plantarflexion    Ankle inversion    Ankle eversion     (Blank rows = not tested)  LOWER EXTREMITY MMT:    MMT Right eval Left eval  Hip flexion 3+ 3+  Hip extension 3+ 3+  Hip abduction 3+ 3+  Hip adduction 3+ 3+  Hip internal rotation    Hip external rotation    Knee flexion    Knee extension    Ankle dorsiflexion    Ankle plantarflexion    Ankle inversion    Ankle eversion     (Blank rows = not tested)  LUMBAR SPECIAL TESTS:  Straight leg raise test: Positive, past 40 degrees but positive for reproduction of symptoms  FUNCTIONAL TESTS:  5 times sit to stand: 12.37 seconds 2 minute walk test: 380 feet  GAIT: Distance walked: 440 Assistive device utilized: None Level of assistance: Complete Independence Comments: Pt demonstrates a couple of missteps but is able to self correct, likely multitasking is contributing factor to missteps.  TREATMENT DATE:  09/17/2024   Evaluation: -ROM measured, Strength assessed, HEP prescribed, pt educated on prognosis, findings, and importance of HEP compliance if given.                                                                                                                                       PATIENT EDUCATION:  Education details: Pt was educated on findings of PT evaluation, prognosis, frequency of therapy visits and rationale, attendance policy, and HEP if given.   Person educated: Patient Education method: Explanation, Verbal cues, and Handouts Education comprehension: verbalized understanding, verbal cues required, tactile cues required, and needs further education  HOME EXERCISE PROGRAM: Access Code: [email or web address] URL: [email or web address] Date: 09/17/2024 Prepared by: Lang Ada  Exercises - Supine Bridge  - 1 x daily - 7 x weekly - 3 sets - 10 reps - 3 hold - Clamshell  - 1 x daily - 7 x weekly - 3 sets - 10 reps - Supine Lower Trunk Rotation  - 1 x daily - 7 x weekly - 3 sets - 10 reps - Hooklying Single Knee to Chest Stretch  - 1 x daily - 7 x weekly - 3 sets - 10 reps  ASSESSMENT:  CLINICAL IMPRESSION:  Patient is a 83 y.o. female who was seen today for physical therapy evaluation and treatment for M54.50 (ICD-10-CM) - Low back pain, unspecified.   Patient demonstrates increased pain with L SLR, palpation to L sciatic region, decreased LE strength, and decreased gait quality. Patient also demonstrates a few missteps with LLE during today's session with ability to self correct with no LOB. Patient also demonstrates increase in pain with PROM hip flexion(end range) and Deri test. Patient requires education on role of PT, HEP, and corrective sleeping posture. Patient would benefit from skilled physical therapy for decreased low back and left hip pain, increased LLE strength, and balance for improved gait quality, return to higher level of function with ADLs, and progress towards therapy goals.   OBJECTIVE IMPAIRMENTS: Abnormal gait, decreased activity tolerance, decreased balance, decreased endurance, decreased knowledge of  condition, decreased knowledge of use of DME, decreased mobility, decreased ROM, decreased strength, hypomobility, impaired flexibility, and pain.   ACTIVITY LIMITATIONS: carrying, lifting, bending, sitting, standing, squatting, sleeping, stairs, transfers, and bed mobility  PARTICIPATION LIMITATIONS: meal prep, cleaning, laundry, shopping, community activity, and yard work  PERSONAL FACTORS: Age, Past/current experiences, and 1 comorbidity: recent abdominal surgery are also affecting patient's functional outcome.   REHAB POTENTIAL: Good  CLINICAL DECISION MAKING: Stable/uncomplicated  EVALUATION COMPLEXITY: Low   GOALS: Goals reviewed with patient? No  SHORT TERM GOALS: Target date: 09/24/24  Pt will be independent with HEP in order to demonstrate participation in Physical Therapy POC.  Baseline: Goal status: INITIAL  2.  Pt will report 5/10 pain with mobility first thing in the morning in order to demonstrate improved pain with ADLs.  Baseline: 6-7/10 Goal status: INITIAL  LONG TERM GOALS: Target date: 10/01/24  Pt will improve LLE strength by at least 1/2 a muscle grade in order to demonstrate improved functional strength to return to desired activities.  Baseline: see objective.  Goal status: INITIAL  2.  Pt will improve 2 MWT by 20 feet in order to demonstrate improved functional ambulatory capacity in community setting.  Baseline: see objective.  Goal status: INITIAL   3.  Pt will report less than 4/10 pain with mobility in order to demonstrate reduced pain with ADLs lasting greater than 30 minutes in the morning.  Baseline: see objective.  Goal status: INITIAL   PLAN:  PT FREQUENCY: 1x/week  PT DURATION: 2 weeks  PLANNED INTERVENTIONS: 97110-Therapeutic exercises, 97530- Therapeutic activity, 97112- Neuromuscular re-education, 97535- Self Care, [id number]- Manual therapy, [phone]- Gait training, [phone] (1-2 muscles), [phone] (3+ muscles)- Dry Needling, Patient/Family  education, Balance training, Stair training, Joint mobilization, Joint manipulation, Spinal manipulation, Spinal mobilization, DME instructions, Cryotherapy, and Moist heat.  PLAN FOR NEXT SESSION: track sleeping position adjustments and HEP for morning, progress HEP each session   Lang Ada, PT, DPT [facility] Office: [phone] 9:31 AM, 09/17/24  [facility] Medicare Auth Request Information Treatment Start Date: 09/17/24  Date of referral: 09/12/24 Referring provider: Edna Toribio LABOR, MD Referring diagnosis (ICD 10)? M54.50 (ICD-10-CM) - Low back pain, unspecified Treatment diagnosis (ICD 10)? (if different than referring diagnosis) M54.59; M25.552; Z74.09  What was this (referring dx) caused by? Ongoing Issue  Lysle of Condition: Initial Onset (within last 3 months)   Laterality: Lt  Current Functional Measure Score: Other 5 times sit to stand: 12.37 seconds  Objective measurements identify impairments when they are compared to normal values, the uninvolved extremity, and prior level of function.  [x]  Yes  []  No  Objective assessment of  functional ability: Minimal functional limitations   Briefly describe symptoms: pain left hip and back in morning after waking up  How did symptoms start: insidious  Average pain intensity:  Last 24 hours: 5/10  Past week: 5/10  How often does the pt experience symptoms? Frequently  How much have the symptoms interfered with usual daily activities? Moderately  How has condition changed since care began at this facility? NA - initial visit  In general, how is the patients overall health? Good   BACK PAIN (STarT Back Screening Tool) Has pain spread down the leg(s) at some time in the last 2 weeks? yes Has there been pain in the shoulder or neck at some time in the last 2 weeks? no Has the pt only walked short distances because of back pain? no Has patient dressed more slowly because of back pain in the  past 2 weeks? yes Does patient think it's not safe for a person with this condition to be physically active? no Does patient have worrying thoughts a lot of the time? no Does patient feel back pain is terrible and will never get any better? no Has patient stopped enjoying things they usually enjoy? no Overall, how bothersome has back pain been in the last 2 weeks?                    Little

## 2024-09-24 ENCOUNTER — Ambulatory Visit (HOSPITAL_COMMUNITY)

## 2024-09-24 DIAGNOSIS — Z7409 Other reduced mobility: Secondary | ICD-10-CM

## 2024-09-24 DIAGNOSIS — M5459 Other low back pain: Secondary | ICD-10-CM

## 2024-09-24 DIAGNOSIS — M25552 Pain in left hip: Secondary | ICD-10-CM

## 2024-09-24 NOTE — Therapy (Signed)
 OUTPATIENT PHYSICAL THERAPY THORACOLUMBAR TREATMENT/DISCHARGE PHYSICAL THERAPY DISCHARGE SUMMARY  Visits from Start of Care: 2  Current functional level related to goals / functional outcomes: See below   Remaining deficits: See below   Education / Equipment: HEP   Patient agrees to discharge. Patient goals were partially met. Patient is being discharged due to the patient's request.    Patient Name: RYLLIE NIELAND MRN: [id number] DOB:06-Aug-1941, 83 y.o., female Today's Date: 09/24/2024  END OF SESSION:  PT End of Session - 09/24/24 0820     Visit Number 2    Number of Visits 6    Date for Recertification  10/15/24    Authorization Type [employer] MEDICARE    Authorization Time Period 6 visits from 09/17/24 to 2/226    Authorization - Visit Number 2    Authorization - Number of Visits 6    Progress Note Due on Visit 3    PT Start Time 0815    PT Stop Time 0853    PT Time Calculation (min) 38 min    Activity Tolerance Patient tolerated treatment well;Patient limited by pain    Behavior During Therapy [facility] for tasks assessed/performed           Past Medical History:  Diagnosis Date   Anal polyp 2005   condyloma acuminatum   Anemia    Arthritis    CKD (chronic kidney disease)    Cr 1.9 -09/2014   Colonic mass    Crohn disease of colon (HCC)    Diverticulitis    with abscess   History of kidney stones    Hypertension    Kidney stone    Past Surgical History:  Procedure Laterality Date   anal polypectomy  01/02/2004   Dr.Rosenbower- anal polyp removed. bx= condyloma acuminatum   CATARACT EXTRACTION W/PHACO Left 08/10/2018   Procedure: CATARACT EXTRACTION PHACO AND INTRAOCULAR LENS PLACEMENT LEFT EYE;  Surgeon: Perley Hamilton, MD;  Location: [facility];  Service: Ophthalmology;  Laterality: Left;  left   CATARACT EXTRACTION W/PHACO Right 09/04/2018   Procedure: CATARACT EXTRACTION PHACO AND INTRAOCULAR LENS PLACEMENT (IOC);  Surgeon: Perley Hamilton, MD;  Location: [facility];  Service: Ophthalmology;  Laterality: Right;  CDE: 11.16   COLONOSCOPY  04/08/2003   Dr.Mann- small nonbleeding internal and external hemorrhoids small polypoid mass at anal verge, normal appearing L colon, transverse colon, R colon including cecum. stenotic ileocecal valve. bx of maa= benign rectal type mucosa with features of mucosal prolapse syndrome   COLONOSCOPY N/A 04/02/2015   Dr.Rourk- colonic diverticulosis, no evidence of colonic neoplasm. abnormal ileocecal valve with ulceration bx= ulcerated/eroded benign ileocecal valve mucosa   COLONOSCOPY N/A 06/14/2018   scattered diverticula in sigmoid and descending colon. Distal 10 cm of neoterminal ileum appears normal. Appears to have surgical remission   EXTRACORPOREAL SHOCK WAVE LITHOTRIPSY Left 05/10/2023   Procedure: EXTRACORPOREAL SHOCK WAVE LITHOTRIPSY (ESWL);  Surgeon: Sherrilee Belvie CROME, MD;  Location: [facility];  Service: Urology;  Laterality: Left;   FLEXIBLE SIGMOIDOSCOPY  11/06/2003   Dr.Mann- small nodular mass at anal verge, bx done. early L side diverticula. bx= benign rectal mucosa with features of mucosal prolapse syndrome. no adenomatous changes or evidence of malignancy identified.    IR NEPHROSTOMY PLACEMENT LEFT  01/05/2018   KIDNEY STONE SURGERY     LAPAROSCOPY N/A 11/02/2017   Procedure: LAPAROSCOPIC ILEOCECECTOMY;  Surgeon: Debby Hila, MD;  Location: [facility];  Service: General;  Laterality: N/A;   NEPHROLITHOTOMY Left 02/13/2018   Procedure: NEPHROLITHOTOMY  PERCUTANEOUS;  Surgeon: Sherrilee Belvie CROME, MD;  Location: [facility];  Service: Urology;  Laterality: Left;   ROBOTIC ASSISTED BILATERAL SALPINGO OOPHERECTOMY Bilateral 05/16/2024   Procedure: SALPINGO-OOPHORECTOMY, BILATERAL, ROBOT-ASSISTED;  Surgeon: Viktoria Comer SAUNDERS, MD;  Location: [facility];  Service: Gynecology;  Laterality: Bilateral;   Patient Active Problem List   Diagnosis Date Noted   Adnexal mass 05/16/2024   Elevated carcinoembryonic antigen (CEA) 05/16/2024    Dizziness 05/16/2024   Former smoker 12/16/2023   Vaginal spotting 12/16/2023   Hematuria 12/16/2023   Kidney stones 11/17/2023   Sensorineural hearing loss, bilateral 08/04/2023   Osteoarthritis of midfoot 02/15/2023   History of kidney stones 07/31/2021   History of diverticulitis 07/31/2021   Osteoarthritis 07/31/2021   Pure hypercholesterolemia 07/31/2021   Osteopenia 05/11/2021   Crohn's disease in remission (HCC) 09/15/2018   Generalized anxiety disorder 09/27/2015   CKD (chronic kidney disease) stage 3, GFR 30-59 ml/min (HCC) 01/14/2015   Essential hypertension, benign 03/11/2013    PCP: Suanne Pfeiffer, NP   REFERRING PROVIDER: Edna Toribio LABOR, MD  REFERRING DIAG: M54.50 (ICD-10-CM) - Low back pain, unspecified  Rationale for Evaluation and Treatment: Rehabilitation  THERAPY DIAG:  Other low back pain  Pain in left hip  Impaired functional mobility, balance, and endurance  ONSET DATE: couple months ago  SUBJECTIVE:                                                                                                                                                                                           SUBJECTIVE STATEMENT: Pain is better; feels she can just do these exercises and not come back.  2/10 pain when it does hurt.    Eval:Pt states the pain worst in the morning first thing, pt states it only hurts on the left side. Pt states she has had no falls. Pt is currently involved with exercise group on Fridays. Pt states she feels like she is doing fine and does not want to take up therapy visits when others need it more than her.   PERTINENT HISTORY:  Ovary removal in October, pain sort of started after that Arthritis in left foot PAIN:  Are you having pain? Yes: NPRS scale: 6-7/10 first thing in the morning, now 0/10 Pain location: left posterior hip Pain description: sharp grabbing pain Aggravating factors: first thing in the morning Relieving  factors: moving around in the morning  PRECAUTIONS: None  RED FLAGS: None   WEIGHT BEARING RESTRICTIONS: No  FALLS:  Has patient fallen in last 6 months? No  LIVING ENVIRONMENT: Lives with: lives with their family Lives in: House/apartment Stairs: Yes: External: 2  steps; none Has following equipment at home: None  OCCUPATION: retired  PLOF: Independent and Independent with basic ADLs  PATIENT GOALS: decrease the pain in the morning  NEXT MD VISIT: none at this time  OBJECTIVE:  Note: Objective measures were completed at Evaluation unless otherwise noted.  DIAGNOSTIC FINDINGS:  None applicable  PATIENT SURVEYS:       SENSATION: Light touch: WFL, numbness and tingling when pain is in leg   POSTURE: rounded shoulders, forward head, and flexed trunk   PALPATION: Slight tenderness to sciatic on left side only  LUMBAR ROM:   AROM eval  Flexion WFL  Extension WFL  Right lateral flexion 25  Left lateral flexion 20, slight feeling of pain  Right rotation WFL  Left rotation WFL   (Blank rows = not tested)  LOWER EXTREMITY ROM:     Active  Right eval Left eval  Hip flexion [facility]  Hip extension [facility]  Hip abduction [facility] WFL  Hip adduction [facility] WFL  Hip internal rotation [facility] WFL  Hip external rotation WFL WFL, slight pain noted  Knee flexion    Knee extension    Ankle dorsiflexion    Ankle plantarflexion    Ankle inversion    Ankle eversion     (Blank rows = not tested)  LOWER EXTREMITY MMT:    MMT Right eval Left eval  Hip flexion 3+ 3+  Hip extension 3+ 3+  Hip abduction 3+ 3+  Hip adduction 3+ 3+  Hip internal rotation    Hip external rotation    Knee flexion    Knee extension    Ankle dorsiflexion    Ankle plantarflexion    Ankle inversion    Ankle eversion     (Blank rows = not tested)  LUMBAR SPECIAL TESTS:  Straight leg raise test: Positive, past 40 degrees but positive for reproduction of symptoms  FUNCTIONAL TESTS:  5  times sit to stand: 12.37 seconds 2 minute walk test: 380 feet  GAIT: Distance walked: 440 Assistive device utilized: None Level of assistance: Complete Independence Comments: Pt demonstrates a couple of missteps but is able to self correct, likely multitasking is contributing factor to missteps.  TREATMENT DATE:  09/24/24 Review of HEP Supine: LTR x 10 SK to chest 20 hold x 5 each Bridge x 10 Clam Hip abduction green therband x 10 Hip adduction with ball 5 hold x 10 Standing: Hip abduction x 10 Hip extension x 10  09/17/2024   Evaluation: -ROM measured, Strength assessed, HEP prescribed, pt educated on prognosis, findings, and importance of HEP compliance if given.                                                                                                                                      PATIENT EDUCATION:  Education details: Pt was educated on findings of PT evaluation, prognosis, frequency of therapy visits and rationale,  attendance policy, and HEP if given.   Person educated: Patient Education method: Explanation, Verbal cues, and Handouts Education comprehension: verbalized understanding, verbal cues required, tactile cues required, and needs further education  HOME EXERCISE PROGRAM: Access Code: [email or web address] URL: [email or web address] Date: 09/24/2024 Prepared by: [facility]  Exercises -- Supine Hip Adduction Isometric with Ball  - 1 x daily - 7 x weekly - 1 sets - 10 reps - Hooklying Clamshell with Resistance  - 1 x daily - 7 x weekly - 2 sets - 10 reps - Standing Hip Abduction with Counter Support  - 1 x daily - 7 x weekly - 2 sets - 10 reps - Standing Hip Extension with Counter Support  - 1 x daily - 7 x weekly - 2 sets - 10 reps  Access Code: [email or web address] URL: [email or web address] Date: 09/17/2024 Prepared by: Lang Ada  Exercises - Supine Bridge  - 1 x daily - 7 x weekly - 3 sets - 10 reps - 3 hold - Clamshell  - 1  x daily - 7 x weekly - 3 sets - 10 reps - Supine Lower Trunk Rotation  - 1 x daily - 7 x weekly - 3 sets - 10 reps - Hooklying Single Knee to Chest Stretch  - 1 x daily - 7 x weekly - 3 sets - 10 reps  ASSESSMENT:  CLINICAL IMPRESSION: Patient reports good improvement with exercise and would like to discontinue formal PT to independent HEP.  Reviewed her HEP and progressed hip and core strengthening today and updated those exercises to her HEP.  Patient able to perform familiar exercises with minimal cueing for correct technique.  Discharge today to HEP.    Patient is a 83 y.o. female who was seen today for physical therapy evaluation and treatment for M54.50 (ICD-10-CM) - Low back pain, unspecified.   Patient demonstrates increased pain with L SLR, palpation to L sciatic region, decreased LE strength, and decreased gait quality. Patient also demonstrates a few missteps with LLE during today's session with ability to self correct with no LOB. Patient also demonstrates increase in pain with PROM hip flexion(end range) and Deri test. Patient requires education on role of PT, HEP, and corrective sleeping posture. Patient would benefit from skilled physical therapy for decreased low back and left hip pain, increased LLE strength, and balance for improved gait quality, return to higher level of function with ADLs, and progress towards therapy goals.   OBJECTIVE IMPAIRMENTS: Abnormal gait, decreased activity tolerance, decreased balance, decreased endurance, decreased knowledge of condition, decreased knowledge of use of DME, decreased mobility, decreased ROM, decreased strength, hypomobility, impaired flexibility, and pain.   ACTIVITY LIMITATIONS: carrying, lifting, bending, sitting, standing, squatting, sleeping, stairs, transfers, and bed mobility  PARTICIPATION LIMITATIONS: meal prep, cleaning, laundry, shopping, community activity, and yard work  PERSONAL FACTORS: Age, Past/current experiences,  and 1 comorbidity: recent abdominal surgery are also affecting patient's functional outcome.   REHAB POTENTIAL: Good  CLINICAL DECISION MAKING: Stable/uncomplicated  EVALUATION COMPLEXITY: Low   GOALS: Goals reviewed with patient? No  SHORT TERM GOALS: Target date: 09/24/24  Pt will be independent with HEP in order to demonstrate participation in Physical Therapy POC.  Baseline: Goal status: INITIAL  2.  Pt will report 5/10 pain with mobility first thing in the morning in order to demonstrate improved pain with ADLs.  Baseline: 6-7/10 Goal status: INITIAL  LONG TERM GOALS: Target date: 10/01/24  Pt will improve LLE strength by at least 1/2 a muscle grade in  order to demonstrate improved functional strength to return to desired activities.  Baseline: see objective.  Goal status: INITIAL  2.  Pt will improve 2 MWT by 20 feet in order to demonstrate improved functional ambulatory capacity in community setting.  Baseline: see objective.  Goal status: INITIAL   3.  Pt will report less than 4/10 pain with mobility in order to demonstrate reduced pain with ADLs lasting greater than 30 minutes in the morning.  Baseline: see objective.  Goal status: INITIAL   PLAN:  PT FREQUENCY: 1x/week  PT DURATION: 2 weeks  PLANNED INTERVENTIONS: 97110-Therapeutic exercises, 97530- Therapeutic activity, 97112- Neuromuscular re-education, 97535- Self Care, [id number]- Manual therapy, [phone]- Gait training, [phone] (1-2 muscles), [phone] (3+ muscles)- Dry Needling, Patient/Family education, Balance training, Stair training, Joint mobilization, Joint manipulation, Spinal manipulation, Spinal mobilization, DME instructions, Cryotherapy, and Moist heat.  PLAN FOR NEXT SESSION: discharge  8:53 AM, 09/24/2024 Arleigh Odowd Small Cannan Beeck MPT [facility] [place] [phone]

## 2024-10-01 ENCOUNTER — Ambulatory Visit (HOSPITAL_COMMUNITY)

## 2025-01-30 ENCOUNTER — Ambulatory Visit: Admitting: Urology
